# Patient Record
Sex: Female | Born: 1950 | Race: Black or African American | Hispanic: No | Smoking: Current every day smoker
Health system: Southern US, Community
[De-identification: ages and names within clinical notes are randomized; demographics above are authoritative.]

## PROBLEM LIST (undated history)

## (undated) DIAGNOSIS — F17200 Nicotine dependence, unspecified, uncomplicated: Secondary | ICD-10-CM

## (undated) DIAGNOSIS — E669 Obesity, unspecified: Secondary | ICD-10-CM

## (undated) DIAGNOSIS — M1712 Unilateral primary osteoarthritis, left knee: Secondary | ICD-10-CM

## (undated) DIAGNOSIS — R600 Localized edema: Secondary | ICD-10-CM

## (undated) DIAGNOSIS — IMO0001 Reserved for inherently not codable concepts without codable children: Secondary | ICD-10-CM

## (undated) DIAGNOSIS — M25569 Pain in unspecified knee: Secondary | ICD-10-CM

## (undated) DIAGNOSIS — I8390 Asymptomatic varicose veins of unspecified lower extremity: Secondary | ICD-10-CM

## (undated) DIAGNOSIS — Z87440 Personal history of urinary (tract) infections: Secondary | ICD-10-CM

## (undated) DIAGNOSIS — I82409 Acute embolism and thrombosis of unspecified deep veins of unspecified lower extremity: Secondary | ICD-10-CM

## (undated) DIAGNOSIS — I10 Essential (primary) hypertension: Secondary | ICD-10-CM

## (undated) HISTORY — DX: Pain in unspecified knee: M25.569

## (undated) HISTORY — DX: Personal history of urinary (tract) infections: Z87.440

## (undated) HISTORY — DX: Unilateral primary osteoarthritis, left knee: M17.12

## (undated) HISTORY — DX: Essential (primary) hypertension: I10

## (undated) HISTORY — DX: Nicotine dependence, unspecified, uncomplicated: F17.200

## (undated) HISTORY — PX: KNEE ARTHROSCOPY: SUR90

## (undated) HISTORY — DX: Obesity, unspecified: E66.9

## (undated) HISTORY — PX: TUBAL LIGATION: SHX77

## (undated) HISTORY — DX: Localized edema: R60.0

## (undated) HISTORY — DX: Reserved for inherently not codable concepts without codable children: IMO0001

---

## 1997-09-10 ENCOUNTER — Emergency Department (HOSPITAL_COMMUNITY): Admission: EM | Admit: 1997-09-10 | Discharge: 1997-09-10 | Payer: Self-pay | Admitting: Emergency Medicine

## 1998-01-25 ENCOUNTER — Encounter: Payer: Self-pay | Admitting: *Deleted

## 1998-01-25 ENCOUNTER — Emergency Department (HOSPITAL_COMMUNITY): Admission: EM | Admit: 1998-01-25 | Discharge: 1998-01-25 | Payer: Self-pay | Admitting: Emergency Medicine

## 1998-03-03 ENCOUNTER — Emergency Department (HOSPITAL_COMMUNITY): Admission: EM | Admit: 1998-03-03 | Discharge: 1998-03-03 | Payer: Self-pay | Admitting: Emergency Medicine

## 1999-03-05 ENCOUNTER — Emergency Department (HOSPITAL_COMMUNITY): Admission: EM | Admit: 1999-03-05 | Discharge: 1999-03-05 | Payer: Self-pay | Admitting: Emergency Medicine

## 1999-03-05 ENCOUNTER — Encounter: Payer: Self-pay | Admitting: Emergency Medicine

## 1999-09-28 ENCOUNTER — Encounter: Payer: Self-pay | Admitting: Emergency Medicine

## 1999-09-28 ENCOUNTER — Emergency Department (HOSPITAL_COMMUNITY): Admission: EM | Admit: 1999-09-28 | Discharge: 1999-09-28 | Payer: Self-pay | Admitting: *Deleted

## 1999-10-24 ENCOUNTER — Encounter: Payer: Self-pay | Admitting: Emergency Medicine

## 1999-10-24 ENCOUNTER — Emergency Department (HOSPITAL_COMMUNITY): Admission: EM | Admit: 1999-10-24 | Discharge: 1999-10-24 | Payer: Self-pay

## 1999-12-08 ENCOUNTER — Emergency Department (HOSPITAL_COMMUNITY): Admission: EM | Admit: 1999-12-08 | Discharge: 1999-12-08 | Payer: Self-pay | Admitting: Emergency Medicine

## 2000-01-18 DIAGNOSIS — M1712 Unilateral primary osteoarthritis, left knee: Secondary | ICD-10-CM

## 2000-01-18 HISTORY — DX: Unilateral primary osteoarthritis, left knee: M17.12

## 2000-04-01 ENCOUNTER — Emergency Department (HOSPITAL_COMMUNITY): Admission: EM | Admit: 2000-04-01 | Discharge: 2000-04-01 | Payer: Self-pay | Admitting: *Deleted

## 2000-04-01 ENCOUNTER — Emergency Department (HOSPITAL_COMMUNITY): Admission: EM | Admit: 2000-04-01 | Discharge: 2000-04-01 | Payer: Self-pay | Admitting: Emergency Medicine

## 2000-06-29 ENCOUNTER — Encounter: Admission: RE | Admit: 2000-06-29 | Discharge: 2000-06-29 | Payer: Self-pay | Admitting: Internal Medicine

## 2000-07-01 ENCOUNTER — Ambulatory Visit (HOSPITAL_COMMUNITY): Admission: RE | Admit: 2000-07-01 | Discharge: 2000-07-01 | Payer: Self-pay | Admitting: Internal Medicine

## 2000-07-06 ENCOUNTER — Encounter: Payer: Self-pay | Admitting: Family Medicine

## 2000-07-06 ENCOUNTER — Ambulatory Visit (HOSPITAL_COMMUNITY): Admission: RE | Admit: 2000-07-06 | Discharge: 2000-07-06 | Payer: Self-pay

## 2000-07-11 ENCOUNTER — Encounter: Admission: RE | Admit: 2000-07-11 | Discharge: 2000-07-11 | Payer: Self-pay | Admitting: Family Medicine

## 2000-07-11 ENCOUNTER — Encounter: Payer: Self-pay | Admitting: Family Medicine

## 2000-09-01 ENCOUNTER — Encounter: Admission: RE | Admit: 2000-09-01 | Discharge: 2000-09-01 | Payer: Self-pay | Admitting: Internal Medicine

## 2001-01-23 ENCOUNTER — Emergency Department (HOSPITAL_COMMUNITY): Admission: EM | Admit: 2001-01-23 | Discharge: 2001-01-23 | Payer: Self-pay | Admitting: Emergency Medicine

## 2001-01-23 ENCOUNTER — Encounter: Payer: Self-pay | Admitting: Emergency Medicine

## 2001-06-10 ENCOUNTER — Emergency Department (HOSPITAL_COMMUNITY): Admission: EM | Admit: 2001-06-10 | Discharge: 2001-06-10 | Payer: Self-pay | Admitting: Emergency Medicine

## 2001-06-12 ENCOUNTER — Emergency Department (HOSPITAL_COMMUNITY): Admission: EM | Admit: 2001-06-12 | Discharge: 2001-06-12 | Payer: Self-pay | Admitting: Emergency Medicine

## 2001-08-14 ENCOUNTER — Encounter: Payer: Self-pay | Admitting: Emergency Medicine

## 2001-08-14 ENCOUNTER — Emergency Department (HOSPITAL_COMMUNITY): Admission: EM | Admit: 2001-08-14 | Discharge: 2001-08-14 | Payer: Self-pay | Admitting: Emergency Medicine

## 2001-10-30 ENCOUNTER — Emergency Department (HOSPITAL_COMMUNITY): Admission: EM | Admit: 2001-10-30 | Discharge: 2001-10-30 | Payer: Self-pay | Admitting: Emergency Medicine

## 2001-10-30 ENCOUNTER — Encounter: Payer: Self-pay | Admitting: Emergency Medicine

## 2002-02-05 ENCOUNTER — Inpatient Hospital Stay (HOSPITAL_COMMUNITY): Admission: AD | Admit: 2002-02-05 | Discharge: 2002-02-05 | Payer: Self-pay | Admitting: *Deleted

## 2002-07-04 ENCOUNTER — Encounter: Admission: RE | Admit: 2002-07-04 | Discharge: 2002-07-04 | Payer: Self-pay | Admitting: Internal Medicine

## 2002-07-11 ENCOUNTER — Encounter: Admission: RE | Admit: 2002-07-11 | Discharge: 2002-07-11 | Payer: Self-pay | Admitting: Internal Medicine

## 2002-11-27 ENCOUNTER — Encounter: Admission: RE | Admit: 2002-11-27 | Discharge: 2002-11-27 | Payer: Self-pay | Admitting: Internal Medicine

## 2002-12-30 ENCOUNTER — Encounter: Admission: RE | Admit: 2002-12-30 | Discharge: 2002-12-30 | Payer: Self-pay | Admitting: Internal Medicine

## 2003-01-08 ENCOUNTER — Encounter: Admission: RE | Admit: 2003-01-08 | Discharge: 2003-01-08 | Payer: Self-pay | Admitting: Internal Medicine

## 2003-01-13 ENCOUNTER — Encounter: Admission: RE | Admit: 2003-01-13 | Discharge: 2003-01-13 | Payer: Self-pay | Admitting: Internal Medicine

## 2003-01-13 ENCOUNTER — Ambulatory Visit (HOSPITAL_COMMUNITY): Admission: RE | Admit: 2003-01-13 | Discharge: 2003-01-13 | Payer: Self-pay | Admitting: Internal Medicine

## 2003-01-28 ENCOUNTER — Encounter: Admission: RE | Admit: 2003-01-28 | Discharge: 2003-01-28 | Payer: Self-pay | Admitting: Internal Medicine

## 2003-02-27 ENCOUNTER — Encounter: Admission: RE | Admit: 2003-02-27 | Discharge: 2003-02-27 | Payer: Self-pay | Admitting: Internal Medicine

## 2003-04-22 ENCOUNTER — Ambulatory Visit (HOSPITAL_BASED_OUTPATIENT_CLINIC_OR_DEPARTMENT_OTHER): Admission: RE | Admit: 2003-04-22 | Discharge: 2003-04-22 | Payer: Self-pay | Admitting: Pediatrics

## 2004-02-04 ENCOUNTER — Inpatient Hospital Stay (HOSPITAL_COMMUNITY): Admission: AD | Admit: 2004-02-04 | Discharge: 2004-02-04 | Payer: Self-pay | Admitting: *Deleted

## 2004-07-08 ENCOUNTER — Emergency Department (HOSPITAL_COMMUNITY): Admission: EM | Admit: 2004-07-08 | Discharge: 2004-07-08 | Payer: Self-pay | Admitting: Emergency Medicine

## 2004-07-21 ENCOUNTER — Ambulatory Visit: Payer: Self-pay | Admitting: Internal Medicine

## 2004-09-07 ENCOUNTER — Ambulatory Visit: Payer: Self-pay | Admitting: Internal Medicine

## 2004-09-07 ENCOUNTER — Ambulatory Visit (HOSPITAL_COMMUNITY): Admission: RE | Admit: 2004-09-07 | Discharge: 2004-09-07 | Payer: Self-pay | Admitting: Internal Medicine

## 2005-02-05 ENCOUNTER — Emergency Department (HOSPITAL_COMMUNITY): Admission: EM | Admit: 2005-02-05 | Discharge: 2005-02-05 | Payer: Self-pay | Admitting: *Deleted

## 2005-02-24 ENCOUNTER — Ambulatory Visit: Payer: Self-pay | Admitting: Internal Medicine

## 2005-05-21 ENCOUNTER — Emergency Department (HOSPITAL_COMMUNITY): Admission: EM | Admit: 2005-05-21 | Discharge: 2005-05-22 | Payer: Self-pay | Admitting: Emergency Medicine

## 2005-05-24 ENCOUNTER — Emergency Department (HOSPITAL_COMMUNITY): Admission: EM | Admit: 2005-05-24 | Discharge: 2005-05-24 | Payer: Self-pay | Admitting: Emergency Medicine

## 2005-09-12 ENCOUNTER — Ambulatory Visit: Payer: Self-pay | Admitting: Hospitalist

## 2005-12-01 DIAGNOSIS — I10 Essential (primary) hypertension: Secondary | ICD-10-CM | POA: Insufficient documentation

## 2005-12-01 DIAGNOSIS — F17200 Nicotine dependence, unspecified, uncomplicated: Secondary | ICD-10-CM | POA: Insufficient documentation

## 2005-12-01 DIAGNOSIS — E669 Obesity, unspecified: Secondary | ICD-10-CM

## 2005-12-28 DIAGNOSIS — M1712 Unilateral primary osteoarthritis, left knee: Secondary | ICD-10-CM

## 2006-03-02 ENCOUNTER — Telehealth (INDEPENDENT_AMBULATORY_CARE_PROVIDER_SITE_OTHER): Payer: Self-pay | Admitting: *Deleted

## 2006-03-03 ENCOUNTER — Encounter (INDEPENDENT_AMBULATORY_CARE_PROVIDER_SITE_OTHER): Payer: Self-pay | Admitting: Ophthalmology

## 2006-03-03 ENCOUNTER — Ambulatory Visit: Payer: Self-pay | Admitting: Hospitalist

## 2006-03-03 LAB — CONVERTED CEMR LAB
BUN: 12 mg/dL (ref 6–23)
CO2: 27 meq/L (ref 19–32)
Calcium: 9.5 mg/dL (ref 8.4–10.5)
Chloride: 103 meq/L (ref 96–112)
Cholesterol: 123 mg/dL (ref 0–200)
Creatinine, Ser: 0.56 mg/dL (ref 0.40–1.20)
Glucose, Bld: 87 mg/dL (ref 70–99)
HDL: 37 mg/dL — ABNORMAL LOW (ref >39)
LDL Cholesterol: 61 mg/dL (ref 0–99)
Potassium: 4.8 meq/L (ref 3.5–5.3)
Sodium: 140 meq/L (ref 135–145)
Total CHOL/HDL Ratio: 3.3
Triglycerides: 125 mg/dL (ref <150)
VLDL: 25 mg/dL (ref 0–40)

## 2006-06-16 ENCOUNTER — Ambulatory Visit: Payer: Self-pay | Admitting: Internal Medicine

## 2006-06-16 LAB — CONVERTED CEMR LAB
Bilirubin Urine: NEGATIVE
Blood in Urine, dipstick: NEGATIVE
Glucose, Urine, Semiquant: NEGATIVE
Ketones, urine, test strip: NEGATIVE
Nitrite: NEGATIVE
Protein, U semiquant: NEGATIVE
Specific Gravity, Urine: 1.015
Urobilinogen, UA: 1
WBC Urine, dipstick: NEGATIVE
pH: 6.5

## 2006-07-10 ENCOUNTER — Telehealth: Payer: Self-pay | Admitting: *Deleted

## 2007-06-04 ENCOUNTER — Ambulatory Visit (HOSPITAL_COMMUNITY): Admission: RE | Admit: 2007-06-04 | Discharge: 2007-06-04 | Payer: Self-pay | Admitting: Orthopedic Surgery

## 2007-06-07 ENCOUNTER — Ambulatory Visit: Payer: Self-pay | Admitting: Internal Medicine

## 2007-06-07 ENCOUNTER — Encounter (INDEPENDENT_AMBULATORY_CARE_PROVIDER_SITE_OTHER): Payer: Self-pay | Admitting: *Deleted

## 2007-06-07 ENCOUNTER — Encounter (INDEPENDENT_AMBULATORY_CARE_PROVIDER_SITE_OTHER): Payer: Self-pay | Admitting: Internal Medicine

## 2007-06-07 ENCOUNTER — Ambulatory Visit (HOSPITAL_COMMUNITY): Admission: RE | Admit: 2007-06-07 | Discharge: 2007-06-07 | Payer: Self-pay | Admitting: Internal Medicine

## 2007-06-08 LAB — CONVERTED CEMR LAB
ALT: 24 units/L (ref 0–35)
AST: 24 units/L (ref 0–37)
Albumin: 4.6 g/dL (ref 3.5–5.2)
Alkaline Phosphatase: 54 units/L (ref 39–117)
BUN: 15 mg/dL (ref 6–23)
CO2: 22 meq/L (ref 19–32)
Calcium: 9.8 mg/dL (ref 8.4–10.5)
Chloride: 100 meq/L (ref 96–112)
Creatinine, Ser: 0.64 mg/dL (ref 0.40–1.20)
Free T4: 1.39 ng/dL (ref 0.89–1.80)
Glucose, Bld: 102 mg/dL — ABNORMAL HIGH (ref 70–99)
HCT: 42.6 % (ref 36.0–46.0)
Hemoglobin: 13.1 g/dL (ref 12.0–15.0)
MCHC: 30.8 g/dL (ref 30.0–36.0)
MCV: 89.9 fL (ref 78.0–100.0)
Platelets: 219 10*3/uL (ref 150–400)
Potassium: 4.7 meq/L (ref 3.5–5.3)
RBC: 4.74 M/uL (ref 3.87–5.11)
RDW: 14.4 % (ref 11.5–15.5)
Sodium: 139 meq/L (ref 135–145)
TSH: 2.075 microintl units/mL (ref 0.350–5.50)
Total Bilirubin: 0.6 mg/dL (ref 0.3–1.2)
Total Protein: 7.6 g/dL (ref 6.0–8.3)
WBC: 5.3 10*3/uL (ref 4.0–10.5)

## 2008-04-21 ENCOUNTER — Emergency Department (HOSPITAL_COMMUNITY): Admission: EM | Admit: 2008-04-21 | Discharge: 2008-04-21 | Payer: Self-pay | Admitting: Emergency Medicine

## 2008-09-03 ENCOUNTER — Telehealth (INDEPENDENT_AMBULATORY_CARE_PROVIDER_SITE_OTHER): Payer: Self-pay | Admitting: Internal Medicine

## 2008-09-08 ENCOUNTER — Emergency Department (HOSPITAL_COMMUNITY): Admission: EM | Admit: 2008-09-08 | Discharge: 2008-09-08 | Payer: Self-pay | Admitting: Emergency Medicine

## 2008-11-27 ENCOUNTER — Ambulatory Visit (HOSPITAL_COMMUNITY): Admission: RE | Admit: 2008-11-27 | Discharge: 2008-11-27 | Payer: Self-pay | Admitting: Orthopedic Surgery

## 2009-07-02 ENCOUNTER — Telehealth (INDEPENDENT_AMBULATORY_CARE_PROVIDER_SITE_OTHER): Payer: Self-pay | Admitting: Internal Medicine

## 2009-07-23 ENCOUNTER — Ambulatory Visit: Payer: Self-pay | Admitting: Internal Medicine

## 2009-07-24 ENCOUNTER — Telehealth: Payer: Self-pay | Admitting: *Deleted

## 2009-07-28 ENCOUNTER — Encounter: Payer: Self-pay | Admitting: Internal Medicine

## 2009-07-28 ENCOUNTER — Ambulatory Visit: Payer: Self-pay | Admitting: Internal Medicine

## 2009-07-29 LAB — CONVERTED CEMR LAB
ALT: 9 units/L (ref 0–35)
AST: 13 units/L (ref 0–37)
Albumin: 4.3 g/dL (ref 3.5–5.2)
Alkaline Phosphatase: 47 units/L (ref 39–117)
BUN: 14 mg/dL (ref 6–23)
Basophils Absolute: 0 10*3/uL (ref 0.0–0.1)
Basophils Relative: 1 % (ref 0–1)
CO2: 29 meq/L (ref 19–32)
Calcium: 9.1 mg/dL (ref 8.4–10.5)
Chloride: 98 meq/L (ref 96–112)
Cholesterol: 164 mg/dL (ref 0–200)
Creatinine, Ser: 0.76 mg/dL (ref 0.40–1.20)
Eosinophils Absolute: 0.2 10*3/uL (ref 0.0–0.7)
Eosinophils Relative: 5 % (ref 0–5)
Glucose, Bld: 122 mg/dL — ABNORMAL HIGH (ref 70–99)
HCT: 40.1 % (ref 36.0–46.0)
HDL: 34 mg/dL — ABNORMAL LOW (ref >39)
Hemoglobin: 12.4 g/dL (ref 12.0–15.0)
Hgb A1c MFr Bld: 6.5 %
LDL Cholesterol: 93 mg/dL (ref 0–99)
Lymphocytes Relative: 43 % (ref 12–46)
Lymphs Abs: 2.1 10*3/uL (ref 0.7–4.0)
MCHC: 30.9 g/dL (ref 30.0–36.0)
MCV: 89.3 fL (ref >=78.0)
Monocytes Absolute: 0.3 10*3/uL (ref 0.1–1.0)
Monocytes Relative: 7 % (ref 3–12)
Neutro Abs: 2.2 10*3/uL (ref 1.7–7.7)
Neutrophils Relative %: 45 % (ref 43–77)
Platelets: 217 10*3/uL (ref 150–400)
Potassium: 4.1 meq/L (ref 3.5–5.3)
RBC: 4.49 M/uL (ref 3.87–5.11)
RDW: 13.9 % (ref 11.5–15.5)
Sodium: 138 meq/L (ref 135–145)
Total Bilirubin: 0.5 mg/dL (ref 0.3–1.2)
Total CHOL/HDL Ratio: 4.8
Total Protein: 7.1 g/dL (ref 6.0–8.3)
Triglycerides: 187 mg/dL — ABNORMAL HIGH (ref <150)
VLDL: 37 mg/dL (ref 0–40)
WBC: 4.9 10*3/uL (ref 4.0–10.5)

## 2009-08-04 ENCOUNTER — Ambulatory Visit: Payer: Self-pay | Admitting: Internal Medicine

## 2009-08-06 LAB — HM MAMMOGRAPHY

## 2009-08-07 ENCOUNTER — Encounter: Payer: Self-pay | Admitting: Internal Medicine

## 2009-08-17 ENCOUNTER — Ambulatory Visit: Payer: Self-pay | Admitting: Internal Medicine

## 2009-08-17 DIAGNOSIS — R7309 Other abnormal glucose: Secondary | ICD-10-CM

## 2009-08-17 LAB — CONVERTED CEMR LAB
Blood Glucose, Fingerstick: 88
Hgb A1c MFr Bld: 6.4 %

## 2010-01-18 ENCOUNTER — Emergency Department (HOSPITAL_COMMUNITY)
Admission: EM | Admit: 2010-01-18 | Discharge: 2010-01-18 | Payer: Self-pay | Source: Home / Self Care | Admitting: Emergency Medicine

## 2010-02-07 ENCOUNTER — Encounter: Payer: Self-pay | Admitting: Obstetrics

## 2010-02-16 NOTE — Assessment & Plan Note (Signed)
Summary: EST-2 WEEK RECHECK/CH   Vital Signs:  Patient profile:   60 year old female Height:      62 inches (157.48 cm) Weight:      220.2 pounds (100.09 kg) BMI:     40.42 Temp:     98.3 degrees F oral Pulse rate:   74 / minute BP sitting:   140 / 79  (left arm) Cuff size:   large  Vitals Entered By: Cynda Familia Duncan Dull) (August 17, 2009 1:35 PM) CC: 2wk f/u- ?newly diagnosed dm, med refill with supply Is Patient Diabetic? Yes Did you bring your meter with you today? No Pain Assessment Patient in pain? no      Nutritional Status BMI of > 30 = obese CBG Result 88  Have you ever been in a relationship where you felt threatened, hurt or afraid?No   Does patient need assistance? Functional Status Self care Ambulation Normal   Primary Care Provider:  Lars Mage MD  CC:  2wk f/u- ?newly diagnosed dm and med refill with supply.  History of Present Illness: April Mathis is here today for a follow up appointment today for her HBa1c and new onset prediabetes.  She wants me to repeat her HBa1c today.  She is very concerned about her being diagnosed with diabetes and is not raeady to accpt the diagnosis. I told her that she is at borderline and may go either way depending upon how she eats and manges her weight.  No other complaints.  Preventive Screening-Counseling & Management  Alcohol-Tobacco     Smoking Status: current     Smoking Cessation Counseling: yes     Packs/Day: 2-3 A WEEK     Year Started: AGE 34   Mammogram  Procedure date:  08/06/2009  Findings:      Assessment: BIRADS 0.   Comments:      Screening mammogram in 1 year.     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 Take 1/2  Tablet By Mouth Once A Day  Current Medications (verified): 1)  Hydrochlorothiazide 25 Mg  Tabs (Hydrochlorothiazide) .... Take 1/2  Tablet By Mouth Once A Day  Allergies: 1)  ! Pcn  Past History:  Past Medical History: Last updated: 12/01/2005 Hypertension Bilat lower extremity edema Obesity Tobacco abuse Knee pain Degenerative joint changes, L kneee by MRI 2002 History of recurrent UTI  Risk Factors: Smoking Status: current (08/17/2009) Packs/Day: 2-3 A WEEK (08/17/2009)  Review of Systems      See HPI  Physical Exam  Additional Exam:  Gen: AOx3, in no acute distress Eyes: PERRL, EOMI ENT:MMM, No erythema noted in posterior pharynx Neck: No JVD, No LAP Chest: CTAB with  good respiratory effort CVS: regular rhythmic rate, NO M/R/G, S1 S2 normal Abdo: soft,ND, BS+x4, Non tender and No hepatosplenomegaly EXT: No odema noted Neuro: Non focal, gait is normal Skin: no rashes noted.    Impression & Recommendations:  Problem # 1:  PREDIABETES (ICD-790.29) Assessment Improved Patient's AHba1c is 6.4 today which is right  at borderline of converting into a diabetes. patient is reaslly scared of diabetic drugs and wants to manage this condition with diet management and weight loss. She would be perfect candidate for metformin therapy based on her age and risk factors for prevention of diabetes. Continue to follow and see her back in 3 months. Labs Reviewed: Creat: 0.76 (07/28/2009)     Problem # 2:  TOBACCO ABUSE (ICD-305.1) Assessment: Unchanged Conselled  about soking cessation. he smokes only 3-5 cigs at night and does not feel that she will benefit from counselling. Encouraged smoking cessation and discussed different methods for smoking cessation.   Problem #  3:  OBESITY (ICD-278.00) Assessment: Comment Only She has lost 3 pounds since last seen I encouraged her to keep losing weight and gave her information about different dietary low carb options. Ht: 62 (08/17/2009)   Wt: 220.2 (08/17/2009)   BMI: 40.42 (08/17/2009)  Complete Medication List: 1)  Hydrochlorothiazide 25 Mg Tabs (Hydrochlorothiazide) .... Take 1/2  tablet by mouth once a day  Other Orders: T-Hgb A1C (in-house) ([id number]) T- Capillary Blood Glucose ([id number])  Patient Instructions: 1)  Please schedule a follow-up appointment in 6 months. 2)  Limit your Sodium (Salt). 3)  It is important that you exercise regularly at least 20 minutes 5 times a week. If you develop chest pain, have severe difficulty breathing, or feel very tired , stop exercising immediately and seek medical attention. 4)  You need to lose weight. Consider a lower calorie diet and regular exercise.  Prescriptions: HYDROCHLOROTHIAZIDE 25 MG  TABS (HYDROCHLOROTHIAZIDE) Take 1/2  tablet by mouth once a day  #90 x 4   Entered and Authorized by:   Lars Mage MD   Signed by:   Lars Mage MD on 08/17/2009   Method used:   Electronically to        [facility] [address] [phone]* (retail)       [address] [place], [place]  [address]       Ph: [phone]       Fax: [phone]   RxID:   [id number]    Prevention & Chronic Care Immunizations   Influenza vaccine: Not documented   Influenza vaccine deferral: Deferred  (08/17/2009)    Tetanus booster: 07/23/2009: Tdap    Pneumococcal vaccine: Not documented  Colorectal Screening   Hemoccult: Not documented   Hemoccult action/deferral: Refused  (08/17/2009)    Colonoscopy: Not documented   Colonoscopy action/deferral: Refused  (08/17/2009)  Other Screening   Pap smear: Not documented   Pap smear action/deferral: Deferred  (08/17/2009)    Mammogram: Assessment: BIRADS 0.   (08/06/2009)   Mammogram action/deferral: Screening mammogram in  1 year.     (08/06/2009)   Smoking status: current  (08/17/2009)   Smoking cessation counseling: yes  (08/17/2009)  Lipids   Total Cholesterol: 164  (07/28/2009)   Lipid panel action/deferral: Deferred   LDL: 93  (07/28/2009)   LDL Direct: Not documented   HDL: 34  (07/28/2009)   Triglycerides: 187  (07/28/2009)  Hypertension   Last Blood Pressure: 140 / 79  (08/17/2009)   Serum creatinine: 0.76  (07/28/2009)   BMP action: Deferred   Serum potassium 4.1  (07/28/2009)    Hypertension flowsheet reviewed?: Yes   Progress toward BP goal: At goal  Self-Management Support :   Personal Goals (by the next clinic visit) :      Personal blood pressure goal: 140/90  (07/23/2009)  Patient will work on the following items until the next clinic visit to reach self-care goals:     Medications and monitoring: take my medicines every day  (08/17/2009)     Eating: eat foods that are low in salt, eat baked foods instead of fried foods  (08/17/2009)     Activity: take a 30 minute walk every day  (08/17/2009)    Hypertension self-management support: Pre-printed educational material, Resources for patients handout, Written self-care plan  (08/17/2009)   Hypertension self-care plan printed.      Resource handout printed.   Laboratory Results   Blood Tests   Date/Time Received: August 17, 2009 1:59 PM  Date/Time Reported: Alric Quan  August 17, 2009 1:59 PM   HGBA1C: 6.4%   (Normal Range: Non-Diabetic - 3-6%   Control Diabetic - 6-8%) CBG Random:: 88mg /dL

## 2010-02-16 NOTE — Progress Notes (Signed)
Summary: GI/ Colonoscopy appointment  Phone Note Outgoing Call   Call placed by: Angelina Ok RN,  July 24, 2009 8:46 AM Call placed to: Specialist Summary of Call: Call to [facility] to schedule screening Colonoscopy.  pt is scheduled for the Nurse visit on 07/29/2009 at 4:00 PM and scheduled for the Colonoscopy on 08/10/2009 3:00 PM to arrive at 2:00 PM at [facility]/Dr. Juanda Chance. Pt was given the appointment card.  Pt to present Medicaid and Medicare cards at  registration to be scanned into the chart.  Pt said she did not show the Insurance Cards at registration.  Angelina Ok RN  July 24, 2009 8:49 AM  Initial call taken by: Angelina Ok RN,  July 24, 2009 8:49 AM  Follow-up for Phone Call        Thank you. Follow-up by: Lars Mage MD,  July 24, 2009 9:57 AM

## 2010-02-16 NOTE — Progress Notes (Signed)
Summary: med refill  Phone Note Refill Request Message from:  Patient on July 02, 2009 2:58 PM  Refills Requested: Medication #1:  HYDROCHLOROTHIAZIDE 25 MG  TABS Take 1/2  tablet by mouth once a day.  Method Requested: Electronic Initial call taken by: Chinita Pester RN,  July 02, 2009 2:58 PM  Follow-up for Phone Call        Rx denied because patient has not been seen since 2009. Follow-up by: Nilda Riggs MD,  July 02, 2009 3:12 PM  Additional Follow-up for Phone Call Additional follow up Details #1::        She has an appt. July 23, 2009 w/Dr. Eben Burow. Additional Follow-up by: Chinita Pester RN,  July 02, 2009 3:20 PM    Additional Follow-up for Phone Call Additional follow up Details #2::    Will provide one month supply prior to being seen by Dr. Eben Burow. Follow-up by: Nilda Riggs MD,  July 02, 2009 3:27 PM  Additional Follow-up for Phone Call Additional follow up Details #3:: Details for Additional Follow-up Action Taken: Pt was called and made awared; informed to keep her appt. in July. Additional Follow-up by: Chinita Pester RN,  July 02, 2009 3:30 PM  Prescriptions: HYDROCHLOROTHIAZIDE 25 MG  TABS (HYDROCHLOROTHIAZIDE) Take 1/2  tablet by mouth once a day  #30 x 0   Entered and Authorized by:   Nilda Riggs MD   Signed by:   Nilda Riggs MD on 07/02/2009   Method used:   Electronically to        [facility] [address] [phone]* (retail)       [address] [place], [place]  [address]       Ph: [phone]       Fax: [phone]   RxID:   [id number]

## 2010-02-16 NOTE — Assessment & Plan Note (Signed)
Summary: EST-CK/FU/MEDS/CFB   Vital Signs:  Patient profile:   60 year old female Height:      62 inches (157.48 cm) Weight:      221.2 pounds (100.55 kg) BMI:     40.60 Temp:     97.7 degrees F (36.50 degrees C) Pulse rate:   74 / minute BP sitting:   120 / 76  (left arm) Cuff size:   large  Vitals Entered By: Dorie Rank RN (August 04, 2009 9:13 AM) CC: pt wanting to know what will be done about her blood sugar - has not gotten second opinion until she talks to dr today Nutritional Status BMI of > 30 = obese  Have you ever been in a relationship where you felt threatened, hurt or afraid?Unable to ask  Domestic Violence Intervention pt in hurry to see dr  Does patient need assistance? Functional Status Self care Ambulation Normal   Primary Care Provider:  Lars Mage MD  CC:  pt wanting to know what will be done about her blood sugar - has not gotten second opinion until she talks to dr today.  History of Present Illness: April Mathis is here today for follow up of last week when when she was tested for Diabetes and her HBa1c was 6.5 which is right at borderline and she was called in for the results for being a diabetic. She still refuses to accept the diagnosis and asks me to repeat the test today. She adds that she was told that she is diabetic when she was pregnant and then on repeat check was told otherwise.  Her BP is well controlled.  She wants to loose weight and asked me about the ways to do the same.   He denies any new sicknesses or hospitalizations, no chest pain episodes, no fevers, no chills, no abdominal or urinary concerns. No recent changes in appetite, weight.   Preventive Screening-Counseling & Management  Alcohol-Tobacco     Smoking Status: current     Smoking Cessation Counseling: yes     Packs/Day: 2-3 A WEEK     Year Started: AGE 62   Problems Prior to Update: 1)  Palpitations  (ICD-785.1) 2)  Preventive Health Care  (ICD-V70.0) 3)  Degenerative  Joint Disease, Knee  (ICD-715.96) 4)  Urinary Tract Infection, Recurrent  (ICD-599.0) 5)  Knee Pain, Left  (ICD-719.46) 6)  Tobacco Abuse  (ICD-305.1) 7)  Obesity  (ICD-278.00) 8)  Dependent Edema, Legs, Bilateral  (ICD-782.3) 9)  Hypertension  (ICD-401.9)  Medications Prior to Update: 1)  Hydrochlorothiazide 25 Mg  Tabs (Hydrochlorothiazide) .... Take 1/2  Tablet By Mouth Once A Day  Current Medications (verified): 1)  Hydrochlorothiazide 25 Mg  Tabs (Hydrochlorothiazide) .... Take 1/2  Tablet By Mouth Once A Day  Allergies (verified): 1)  ! Pcn  Past History:  Past Medical History: Last updated: 12/01/2005 Hypertension Bilat lower extremity edema Obesity Tobacco abuse Knee pain Degenerative joint changes, L kneee by MRI 2002 History of recurrent UTI  Risk Factors: Smoking Status: current (08/04/2009) Packs/Day: 2-3 A WEEK (08/04/2009)  Review of Systems      See HPI  Physical Exam  Additional Exam:  Gen: AOx3, in no acute distress Eyes: PERRL, EOMI ENT:MMM, No erythema noted in posterior pharynx Neck: No JVD, No LAP Chest: CTAB with  good respiratory effort CVS: regular rhythmic rate, NO M/R/G, S1 S2 normal Abdo: soft,ND, BS+x4, Non tender and No hepatosplenomegaly EXT: No odema noted Neuro: Non focal, gait is normal Skin:  no rashes noted.    Impression & Recommendations:  Problem # 1:  DIABETES MELLITUS, TYPE II (ICD-250.00) Assessment New Patient is not ready to accept the diagnosis at this time. I provided her with the information for patinets printout form uptodate and also gave her basic information about how to diagnose diabetes.  She says that she still wants to recheck her HBA1c. I told her it doesnt change much in a week eventually she agreed that she will have it checked when she looses her weight in next 2 weeks.  Orders: T-Hgb A1C (in-house) ([id number])  Problem # 2:  TOBACCO ABUSE (ICD-305.1) Assessment: Unchanged  Encouraged smoking  cessation and discussed different methods for smoking cessation.  Patient was counseled on smoking cessation strategies including medications and behavior modification options.   Problem # 3:  OBESITY (ICD-278.00) Assessment: Unchanged  Discussed various strategies to loose weight. She agrred to try and go on a juice nsd fruit diet for next 2 weeks and return for a recheck of her HBa1c.  Ht: 62 (08/04/2009)   Wt: 221.2 (08/04/2009)   BMI: 40.60 (08/04/2009)  Problem # 4:  HYPERTENSION (ICD-401.9) Assessment: Comment Only Well controlled on HCTZ. Her updated medication list for this problem includes:    Hydrochlorothiazide 25 Mg Tabs (Hydrochlorothiazide) .Marland Kitchen... Take 1/2  tablet by mouth once a day  BP today: 120/76 Prior BP: 126/75 (07/23/2009)  Labs Reviewed: K+: 4.1 (07/28/2009) Creat: : 0.76 (07/28/2009)   Chol: 164 (07/28/2009)   HDL: 34 (07/28/2009)   LDL: 93 (07/28/2009)   TG: 187 (07/28/2009)  Problem # 5:  Preventive Health Care (ICD-V70.0) Assessment: Comment Only She was in alot of hurry and we were not able to discuss about various preventive strategies.  Complete Medication List: 1)  Hydrochlorothiazide 25 Mg Tabs (Hydrochlorothiazide) .... Take 1/2  tablet by mouth once a day  Patient Instructions: 1)  Please schedule a follow-up appointment in 2 weeks. 2)  YOu need to loose weight. 3)  We have not started you any meds at this time but we will like you accept your diagnosis and read about metformin. 4)  Limit your Sodium (Salt). 5)  It is important that you exercise regularly at least 20 minutes 5 times a week. If you develop chest pain, have severe difficulty breathing, or feel very tired , stop exercising immediately and seek medical attention. 6)  You need to lose weight. Consider a lower calorie diet and regular exercise.  7)  Check your Blood Pressure regularly. If it is above: you should make an appointment.   Prevention & Chronic Care Immunizations    Influenza vaccine: Not documented   Influenza vaccine deferral: Deferred  (07/23/2009)    Tetanus booster: 07/23/2009: Tdap    Pneumococcal vaccine: Not documented  Colorectal Screening   Hemoccult: Not documented   Hemoccult action/deferral: Deferred  (07/23/2009)    Colonoscopy: Not documented   Colonoscopy action/deferral: GI referral  (07/23/2009)  Other Screening   Pap smear: Not documented   Pap smear action/deferral: GYN Referral  (07/23/2009)    Mammogram: Not documented   Mammogram action/deferral: Ordered  (07/23/2009)   Smoking status: current  (08/04/2009)   Smoking cessation counseling: yes  (08/04/2009)  Diabetes Mellitus   HgbA1C: 6.5  (07/28/2009)    Eye exam: Not documented    Foot exam: Not documented   High risk foot: Not documented   Foot care education: Not documented    Urine microalbumin/creatinine ratio: Not documented  Lipids   Total Cholesterol:  164  (07/28/2009)   Lipid panel action/deferral: Deferred   LDL: 93  (07/28/2009)   LDL Direct: Not documented   HDL: 34  (07/28/2009)   Triglycerides: 187  (07/28/2009)  Hypertension   Last Blood Pressure: 120 / 76  (08/04/2009)   Serum creatinine: 0.76  (07/28/2009)   BMP action: Deferred   Serum potassium 4.1  (07/28/2009)  Self-Management Support :   Personal Goals (by the next clinic visit) :      Personal blood pressure goal: 140/90  (07/23/2009)   Diabetes self-management support: Resources for patients handout  (07/23/2009)    Hypertension self-management support: Written self-care plan, Education handout, Pre-printed educational material, Resources for patients handout  (07/23/2009)

## 2010-02-16 NOTE — Assessment & Plan Note (Signed)
Summary: EST-CK/FU/MEDS/CFB   Vital Signs:  Patient profile:   60 year old female Height:      62 inches (157.48 cm) Weight:      223.01 pounds (101.37 kg) BMI:     40.94 Temp:     98.2 degrees F (36.78 degrees C) oral Pulse rate:   74 / minute BP sitting:   126 / 75  (right arm)  Vitals Entered By: Angelina Ok RN (July 23, 2009 3:49 PM) CC: Depression Is Patient Diabetic? No Pain Assessment Patient in pain? no      Nutritional Status BMI of > 30 = obese  Have you ever been in a relationship where you felt threatened, hurt or afraid?No   Does patient need assistance? Functional Status Self care Ambulation Normal Comments Wants to be tested for Diabetes.  Has had Gestational Diabetes.  Dry mouth.  Problems holding her water at times.   Has been taking 1 whole B/P pill and then sometimes a 1/2 pill.  Had pain in the ack of her head after using eye drops from the Eye docteor.  Got betterafter taking the Eye drops.   Primary Care Provider:  Lars Mage MD  CC:  Depression.  History of Present Illness: Patient is 60 yo women with PMH as described in EMR is here today for routine follow up.  She wants to get herself checked for diabetes as she had gestational diabetes about 25 years ago. She does have increased thirst, execessive urination or weight loss these days.   Her BP is well controlled.  No other complaints at this time.  Depression History:      The patient denies a depressed mood most of the day and a diminished interest in her usual daily activities.         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 Take 1/2  Tablet By Mouth Once A Day  Current Medications (verified): 1)  Hydrochlorothiazide 25 Mg  Tabs (Hydrochlorothiazide) .... Take 1/2  Tablet By Mouth Once A Day  Allergies (verified): 1)  ! Pcn  Past History:  Past Medical History: Last updated: 12/01/2005 Hypertension Bilat lower extremity edema Obesity Tobacco abuse Knee pain Degenerative joint changes, L kneee by MRI 2002 History of recurrent UTI  Risk Factors: Smoking Status: current (07/23/2009) Packs/Day: 2-3 A WEEK (07/23/2009)  Review of Systems      See HPI  Physical Exam  Additional Exam:  Gen: AOx3, in no acute distress Eyes: PERRL, EOMI ENT:MMM, No erythema noted in posterior pharynx Neck: No JVD, No LAP Chest: CTAB with  good respiratory effort CVS: regular rhythmic rate, NO M/R/G, S1 S2 normal Abdo: soft,ND, BS+x4, Non tender and No hepatosplenomegaly EXT: No odema noted Neuro: Non focal, gait is normal Skin: no rashes noted.    Impression & Recommendations:  Problem #  1:  OBESITY (ICD-278.00) I will check her Hba1c to rule out Diabeties as she is high risk due to gestational diabests and obesity. Discussed various measures to lose weight. Future Orders: T-CMP with Estimated GFR ([id number]) ... 07/28/2009 T-Hgb A1C (in-house) [phone]) ... 07/28/2009  Ht: 62 (07/23/2009)   Wt: 223.01 (07/23/2009)   BMI: 40.94 (07/23/2009)  Problem # 2:  TOBACCO ABUSE (ICD-305.1) Assessment: Comment Only Patient was counseled on smoking cessation strategies including medications and behavior modification options.  Encouraged smoking cessation and discussed different methods for smoking cessation. Not ready to quit at this  time.  Problem # 3:  PREVENTIVE HEALTH CARE (ICD-V70.0) Assessment: Comment Only Tetanus Mammogram Gyn referral Yearly Lipd profile for monitoring   Orders: Tdap => 4yrs IM ([id number]) Admin 1st Vaccine ([id number])Future Orders: T-Lipid Profile ([id number]) ... 07/28/2009 T-CBC w/Diff ([id number]) ... 07/28/2009 T-Hgb A1C (in-house) [phone]) ... 07/28/2009  Problem # 4:  HYPERTENSION (ICD-401.9) Assessment: Improved Well conmtrolled with current meds. Will, check Cm,et for K and cRt and she does not have a hepatic panel in system and will order Cmet instaed of Bmet. Her updated medication list for this problem includes:    Hydrochlorothiazide 25 Mg Tabs (Hydrochlorothiazide) .Marland Kitchen... Take 1/2  tablet by mouth once a day  Future Orders: T-CMP with Estimated GFR ([id number]) ... 07/28/2009  BP today: 126/75 Prior BP: 111/77 (06/07/2007)  Labs Reviewed: K+: 4.7 (06/07/2007) Creat: : 0.64 (06/07/2007)   Chol: 123 (03/03/2006)   HDL: 37 (03/03/2006)   LDL: 61 (03/03/2006)   TG: 125 (03/03/2006)  Complete Medication List: 1)  Hydrochlorothiazide 25 Mg Tabs (Hydrochlorothiazide) .... Take 1/2  tablet by mouth once a day  Other Orders: Gynecologic Referral (Gyn)  Patient Instructions: 1)  You have an appointment on July 12th 2011 and you have to come fasting and get your blood drawn for the regular labs. 2)  You also have Lifestyle modification classes on that day which starts at 11 AM. 3)  Tobacco is very bad for your health and your loved ones! You Should stop smoking!. 4)  Stop Smoking Tips: Choose a Quit date. Cut down before the Quit date. decide what you will do as a substitute when you feel the urge to smoke(gum,toothpick,exercise). 5)  It is important that you exercise regularly at least 20 minutes 5 times a week. If you develop chest pain, have severe difficulty breathing, or feel very tired , stop exercising immediately and seek medical attention. 6)  You need to lose weight.  Consider a lower calorie diet and regular exercise.  7)  Schedule your mammogram. 8)  Schedule a colonoscopy/sigmoidoscopy to help detect colon cancer. 9)  You need to have a Pap Smear to prevent cervical cancer. 10)  Check your Blood Pressure regularly. If it is above: you should make an appointment. Prescriptions: HYDROCHLOROTHIAZIDE 25 MG  TABS (HYDROCHLOROTHIAZIDE) Take 1/2  tablet by mouth once a day  #30 x 12   Entered and Authorized by:   Lars Mage MD   Signed by:   Lars Mage MD on 07/23/2009   Method used:   Print then Give to Patient   RxID:   [id number]   Prevention & Chronic Care Immunizations   Influenza vaccine: Not documented   Influenza vaccine deferral: Deferred  (07/23/2009)    Tetanus booster: 07/23/2009: Tdap    Pneumococcal vaccine: Not documented  Colorectal Screening   Hemoccult: Not documented   Hemoccult action/deferral: Deferred  (07/23/2009)    Colonoscopy: Not documented   Colonoscopy action/deferral:  GI referral  (07/23/2009)  Other Screening   Pap smear: Not documented   Pap smear action/deferral: GYN Referral  (07/23/2009)    Mammogram: Not documented   Mammogram action/deferral: Ordered  (07/23/2009)   Smoking status: current  (07/23/2009)   Smoking cessation counseling: yes  (07/23/2009)  Lipids   Total Cholesterol: 123  (03/03/2006)   Lipid panel action/deferral: Deferred   LDL: 61  (03/03/2006)   LDL Direct: Not documented   HDL: 37  (03/03/2006)   Triglycerides: 125  (03/03/2006)  Hypertension   Last Blood Pressure: 126 / 75  (07/23/2009)   Serum creatinine: 0.64  (06/07/2007)   BMP action: Deferred   Serum potassium 4.7  (06/07/2007)    Hypertension flowsheet reviewed?: Yes   Progress toward BP goal: At goal  Self-Management Support :   Personal Goals (by the next clinic visit) :      Personal blood pressure goal: 140/90  (07/23/2009)   Patient will work on the following items until the next clinic visit to reach  self-care goals:     Medications and monitoring: take my medicines every day, bring all of my medications to every visit  (07/23/2009)     Eating: drink diet soda or water instead of juice or soda, eat more vegetables, eat foods that are low in salt, eat baked foods instead of fried foods, eat fruit for snacks and desserts  (07/23/2009)     Activity: take a 30 minute walk every day  (07/23/2009)    Hypertension self-management support: Written self-care plan, Education handout, Pre-printed educational material, Resources for patients handout  (07/23/2009)   Hypertension self-care plan printed.   Hypertension education handout printed      Resource handout printed.   Nursing Instructions: Give tetanus booster today Gyn referral for screening Pap (see order) Schedule screening mammogram (see order)     Vital Signs:  Patient profile:   60 year old female Height:      62 inches (157.48 cm) Weight:      223.01 pounds (101.37 kg) BMI:     40.94 Temp:     98.2 degrees F (36.78 degrees C) oral Pulse rate:   74 / minute BP sitting:   126 / 75  (right arm)  Vitals Entered By: Angelina Ok RN (July 23, 2009 3:49 PM)   Process Orders Check Orders Results:     [facility]: ABN not required for this insurance Tests Sent for requisitioning (July 28, 2009 2:32 PM):     07/28/2009: [facility] -- T-CMP with Estimated GFR [[id number]] (signed)     07/28/2009: [facility] -- T-Lipid Profile [phone] (signed)     07/28/2009: [facility] -- [facility] w/Diff [[id number]] (signed)    Process Orders Check Orders Results:     [facility]: ABN not required for this insurance Tests Sent for requisitioning (July 28, 2009 2:32 PM):     07/28/2009: [facility] -- T-CMP with Estimated GFR [[id number]] (signed)     07/28/2009: [facility] -- T-Lipid Profile [phone] (signed)      07/28/2009: [facility] -- [facility] w/Diff [[id number]] (signed)      Immunizations Administered:  Tetanus Vaccine:    Vaccine Type: Tdap    Site: right deltoid    Mfr: GlaxoSmithKline    Dose: 0.5 ml    Route: IM    Given by: Angelina Ok RN    Exp. Date: 04/10/2011    Lot #: [id number]  VIS given: 12/05/06 version given July 23, 2009.  Appended Document: EST-CK/FU/MEDS/CFB HCTZ Rx called to [facility] on [address]; pt. stated she did not receive a Rx 7/7.

## 2010-02-26 ENCOUNTER — Encounter: Payer: Self-pay | Admitting: Internal Medicine

## 2010-02-26 ENCOUNTER — Emergency Department (HOSPITAL_COMMUNITY)
Admission: EM | Admit: 2010-02-26 | Discharge: 2010-02-26 | Disposition: A | Discharge status: Home / Self Care | Payer: Medicare Other | Attending: Emergency Medicine | Admitting: Emergency Medicine

## 2010-02-26 DIAGNOSIS — N390 Urinary tract infection, site not specified: Secondary | ICD-10-CM | POA: Insufficient documentation

## 2010-02-26 DIAGNOSIS — R300 Dysuria: Secondary | ICD-10-CM | POA: Insufficient documentation

## 2010-02-26 DIAGNOSIS — I10 Essential (primary) hypertension: Secondary | ICD-10-CM | POA: Insufficient documentation

## 2010-02-26 LAB — URINALYSIS, ROUTINE W REFLEX MICROSCOPIC
Bilirubin Urine: NEGATIVE
Ketones, ur: NEGATIVE mg/dL
Nitrite: NEGATIVE
Protein, ur: NEGATIVE mg/dL
Specific Gravity, Urine: 1.011 (ref 1.005–1.030)
Urine Glucose, Fasting: NEGATIVE mg/dL
Urobilinogen, UA: 0.2 mg/dL (ref 0.0–1.0)
pH: 7 (ref 5.0–8.0)

## 2010-02-26 LAB — URINE MICROSCOPIC-ADD ON

## 2010-03-29 LAB — URINE CULTURE
Colony Count: 55000
Culture  Setup Time: 201201030558

## 2010-03-29 LAB — URINALYSIS, ROUTINE W REFLEX MICROSCOPIC
Bilirubin Urine: NEGATIVE
Glucose, UA: NEGATIVE mg/dL
Ketones, ur: NEGATIVE mg/dL
Nitrite: NEGATIVE
Protein, ur: 30 mg/dL — AB
Specific Gravity, Urine: 1.015 (ref 1.005–1.030)
Urobilinogen, UA: 1 mg/dL (ref 0.0–1.0)
pH: 6 (ref 5.0–8.0)

## 2010-03-29 LAB — URINE MICROSCOPIC-ADD ON

## 2010-04-02 LAB — GLUCOSE, CAPILLARY: Glucose-Capillary: 88 mg/dL (ref 70–99)

## 2010-04-21 LAB — COMPREHENSIVE METABOLIC PANEL
ALT: 19 U/L (ref 0–35)
AST: 28 U/L (ref 0–37)
Albumin: 3.9 g/dL (ref 3.5–5.2)
Alkaline Phosphatase: 46 U/L (ref 39–117)
BUN: 10 mg/dL (ref 6–23)
CO2: 30 mEq/L (ref 19–32)
Calcium: 9.3 mg/dL (ref 8.4–10.5)
Chloride: 97 mEq/L (ref 96–112)
Creatinine, Ser: 0.67 mg/dL (ref 0.4–1.2)
GFR calc Af Amer: >60 mL/min (ref >60)
GFR calc non Af Amer: >60 mL/min (ref >60)
Glucose, Bld: 115 mg/dL — ABNORMAL HIGH (ref 70–99)
Potassium: 4.1 mEq/L (ref 3.5–5.1)
Sodium: 134 mEq/L — ABNORMAL LOW (ref 135–145)
Total Bilirubin: 0.9 mg/dL (ref 0.3–1.2)
Total Protein: 7.4 g/dL (ref 6.0–8.3)

## 2010-04-21 LAB — CBC
HCT: 38.6 % (ref 36.0–46.0)
Hemoglobin: 13.2 g/dL (ref 12.0–15.0)
MCHC: 34.1 g/dL (ref 30.0–36.0)
MCV: 87.3 fL (ref 78.0–100.0)
Platelets: 196 10*3/uL (ref 150–400)
RBC: 4.42 MIL/uL (ref 3.87–5.11)
RDW: 14 % (ref 11.5–15.5)
WBC: 6.6 10*3/uL (ref 4.0–10.5)

## 2010-04-21 LAB — PROTIME-INR
INR: 1.05 (ref 0.00–1.49)
Prothrombin Time: 13.6 seconds (ref 11.6–15.2)

## 2010-04-21 LAB — APTT: aPTT: 32 seconds (ref 24–37)

## 2010-04-27 ENCOUNTER — Emergency Department (HOSPITAL_COMMUNITY)
Admission: EM | Admit: 2010-04-27 | Discharge: 2010-04-27 | Disposition: A | Discharge status: Home / Self Care | Payer: Medicare Other | Attending: Emergency Medicine | Admitting: Emergency Medicine

## 2010-04-27 ENCOUNTER — Telehealth: Payer: Self-pay | Admitting: *Deleted

## 2010-04-27 ENCOUNTER — Encounter: Payer: Self-pay | Admitting: Internal Medicine

## 2010-04-27 ENCOUNTER — Ambulatory Visit (INDEPENDENT_AMBULATORY_CARE_PROVIDER_SITE_OTHER): Payer: Medicare Other | Admitting: Internal Medicine

## 2010-04-27 ENCOUNTER — Encounter: Payer: Self-pay | Admitting: *Deleted

## 2010-04-27 DIAGNOSIS — F17200 Nicotine dependence, unspecified, uncomplicated: Secondary | ICD-10-CM | POA: Insufficient documentation

## 2010-04-27 DIAGNOSIS — R112 Nausea with vomiting, unspecified: Secondary | ICD-10-CM

## 2010-04-27 DIAGNOSIS — I10 Essential (primary) hypertension: Secondary | ICD-10-CM

## 2010-04-27 DIAGNOSIS — E119 Type 2 diabetes mellitus without complications: Secondary | ICD-10-CM

## 2010-04-27 DIAGNOSIS — E669 Obesity, unspecified: Secondary | ICD-10-CM

## 2010-04-27 DIAGNOSIS — N390 Urinary tract infection, site not specified: Secondary | ICD-10-CM

## 2010-04-27 DIAGNOSIS — R109 Unspecified abdominal pain: Secondary | ICD-10-CM | POA: Insufficient documentation

## 2010-04-27 DIAGNOSIS — Z79899 Other long term (current) drug therapy: Secondary | ICD-10-CM | POA: Insufficient documentation

## 2010-04-27 LAB — POCT I-STAT, CHEM 8
BUN: 11 mg/dL (ref 6–23)
Calcium, Ion: 1.05 mmol/L — ABNORMAL LOW (ref 1.12–1.32)
Chloride: 102 mEq/L (ref 96–112)
Creatinine, Ser: 0.6 mg/dL (ref 0.4–1.2)
Glucose, Bld: 125 mg/dL — ABNORMAL HIGH (ref 70–99)
HCT: 41 % (ref 36.0–46.0)
Hemoglobin: 13.9 g/dL (ref 12.0–15.0)
Potassium: 3.9 mEq/L (ref 3.5–5.1)
Sodium: 140 mEq/L (ref 135–145)
TCO2: 27 mmol/L (ref 0–100)

## 2010-04-27 LAB — URINALYSIS, ROUTINE W REFLEX MICROSCOPIC
Protein, ur: NEGATIVE mg/dL
Urobilinogen, UA: 1 mg/dL (ref 0.0–1.0)

## 2010-04-27 LAB — DIFFERENTIAL
Basophils Absolute: 0 10*3/uL (ref 0.0–0.1)
Basophils Relative: 0 % (ref 0–1)
Eosinophils Relative: 0 % (ref 0–5)
Monocytes Absolute: 0.3 10*3/uL (ref 0.1–1.0)
Neutro Abs: 6.3 10*3/uL (ref 1.7–7.7)

## 2010-04-27 LAB — POCT GLYCOSYLATED HEMOGLOBIN (HGB A1C): Hemoglobin A1C: 5.9

## 2010-04-27 LAB — CBC
Hemoglobin: 12.8 g/dL (ref 12.0–15.0)
MCHC: 32.6 g/dL (ref 30.0–36.0)
RDW: 14.1 % (ref 11.5–15.5)

## 2010-04-27 MED ORDER — CIPROFLOXACIN HCL 500 MG PO TABS: 500.0000 mg | ORAL_TABLET | Freq: Two times a day (BID) | ORAL | Status: DC

## 2010-04-27 MED ORDER — LISINOPRIL 10 MG PO TABS: 10.0000 mg | ORAL_TABLET | Freq: Every day | ORAL | Status: DC

## 2010-04-27 NOTE — Assessment & Plan Note (Signed)
Patient has been having problems with frequent urination and requests that it should be changed to something else. i will start her on lisinopril 10 mg and call her back in 7-10 days for repeat Bmet and bp check.

## 2010-04-27 NOTE — Assessment & Plan Note (Addendum)
Patient is having recurrent urinary tract infections. Despite having negative urine analysis in ER I would still prescribe her ciprofloxacin mostly for GI reasons but definitely since she is complaining to me of urinary frequency and had gone to urinate at least twice during a 15 minute conversation. Patient definitely does not have abdominal tenderness or CVA tenderness which makes kidney infection less likely.

## 2010-04-27 NOTE — Assessment & Plan Note (Signed)
Patient's symptoms are unclear at this point of time. It could definitely be a viral gastroenteritis but the possibilities of an H. pylori infection or a similar bacterial gastroenteritis cannot be completely ruled out I would start the patient on ciprofloxacin 500 mg twice a day since this will cover GU tract organisms as well. Follow up on an as needed basis.

## 2010-04-27 NOTE — Assessment & Plan Note (Signed)
Given her nausea and vomiting I would not change any blood pressure medications despite she is hypertensive.

## 2010-04-27 NOTE — Progress Notes (Signed)
  Subjective:    Patient ID: April Mathis, female    DOB: June 22, 1950, 60 y.o.   MRN: [id number]  HPI patient is a 60 year old female with past medical history as described.  Patient was seen in the ER this morning for nausea and vomiting and was discharged after her initial blood work was negative.  Patient comes in today for followup of her nausea and vomiting. Patient had a big meal yesterday evening and started throwing up about midnight patient states she had about 2-3 vomiting and vomited everything that she ate about 5-6 hours ago. The vomitus did not contain any blood. The vomitus was not projectile. Patient feels weak and is not able to eat anything. Patient feels that she has had fever though her temperature in the emergency department was 98.8. Patient's temperature today here in the clinic is also 98.8. No chest pain shortness of breath. No hypotension. Patient does not have tachycardia.  Patient does complain that she's had problem with constipation last few days. I advised her to take fiber supplements with her diet.  Patient is obese with weight of about 96 kg. I advised her to lose some weight. Patient is also prediabetic at this time.  Patient has had problems with the recurrent urinary tract infections with at least one infection every year for last 3 years. Patient does complain of increased urinary frequency and last 2 days.      Review of Systems  Constitutional: Negative for fever, activity change and appetite change.  HENT: Negative for sore throat.   Respiratory: Negative for cough and shortness of breath.   Cardiovascular: Negative for chest pain and leg swelling.  Gastrointestinal: Negative for nausea, abdominal pain, diarrhea, constipation and abdominal distention.       [Nausea and vomiting Genitourinary: Positive for frequency. Negative for dysuria, hematuria, difficulty urinating and dyspareunia.       [constipation Neurological: Negative for dizziness and  headaches.  Psychiatric/Behavioral: Negative for suicidal ideas and behavioral problems.       Objective:   Physical Exam  Constitutional: She is oriented to person, place, and time. She appears well-developed and well-nourished.  HENT:  Head: Normocephalic and atraumatic.  Eyes: Conjunctivae and EOM are normal. Pupils are equal, round, and reactive to light. No scleral icterus.  Neck: Normal range of motion. Neck supple. No JVD present. No thyromegaly present.  Cardiovascular: Normal rate, regular rhythm, normal heart sounds and intact distal pulses.  Exam reveals no gallop and no friction rub.   No murmur heard. Pulmonary/Chest: Effort normal and breath sounds normal. No respiratory distress. She has no wheezes. She has no rales.  Abdominal: Soft. Bowel sounds are normal. She exhibits no distension and no mass. There is no tenderness. There is no rebound and no guarding.  Musculoskeletal: Normal range of motion. She exhibits no edema and no tenderness.  Lymphadenopathy:    She has no cervical adenopathy.  Neurological: She is alert and oriented to person, place, and time.  Psychiatric: She has a normal mood and affect. Her behavior is normal.          Assessment & Plan:

## 2010-04-27 NOTE — Progress Notes (Unsigned)
  Subjective:    Patient ID: April Mathis, female    DOB: [birth year], 60 y.o.   MRN: [id number]  HPI    Review of Systems     Objective:   Physical Exam        Assessment & Plan:

## 2010-04-27 NOTE — Patient Instructions (Signed)
Nausea Nausea is the uncomfortable feeling you get in your stomach before you vomit. There are many causes of nausea and vomiting. Common examples include:   Virus infections.   Food poisoning.   Medications.   Pregnancy.   Motion sickness.   Migraine headaches.   Emotional distress.   Severe pain from any source.   Alcohol intoxication.  Treatment is aimed at the relief of symptoms. If able, prevention of vomiting and dehydration works best.  Most virus infections and food poisoning symptoms will improve within 12-24 hours of onset. Some medications that cause nausea may be taken at bedtime. Avoid alcohol since this can irritate the stomach and make your symptoms worse. Get plenty of rest. Eat or drink small amounts of food and liquids more often. Medications for nausea, vomiting, and motion sickness are given orally, by suppository, or by injection. Call your caregiver if your condition is not improved within 2 days or your condition gets worse in a few hours. SEEK IMMEDIATE MEDICAL CARE IF:  You develop repeated vomiting, blood in the vomit, or dark or bloody stools   You develop severe chest or abdominal pain or headache   You develop fainting, extreme weakness, dehydration, or a high fever  Document Released: 02/11/2004 Document Re-Released: 04/01/2008 [facility] Patient Information 2011 [place], [place].   Call back in clinic or come to Er if you feel worse. Take plenty of fluids. Follow up in 7-10 days. You have been started on a new medicine today.

## 2010-04-27 NOTE — Assessment & Plan Note (Signed)
Advised extensively about benefit of weight loss especially when she's prediabetic. I do not think that patient completely understands her situation and keeps on asking me about what his prediabetes despite extensive counseling in the past, I had also given her written documentation with instructions on how to interpret prediabetes.

## 2010-05-04 ENCOUNTER — Encounter: Payer: Self-pay | Admitting: Internal Medicine

## 2010-05-04 ENCOUNTER — Ambulatory Visit (INDEPENDENT_AMBULATORY_CARE_PROVIDER_SITE_OTHER): Payer: Medicare Other | Admitting: Internal Medicine

## 2010-05-04 VITALS — BP 138/78 | HR 72 | Temp 98.3°F | Resp 20 | Ht 61.0 in | Wt 213.2 lb

## 2010-05-04 DIAGNOSIS — I10 Essential (primary) hypertension: Secondary | ICD-10-CM

## 2010-05-04 DIAGNOSIS — N390 Urinary tract infection, site not specified: Secondary | ICD-10-CM

## 2010-05-04 DIAGNOSIS — E669 Obesity, unspecified: Secondary | ICD-10-CM

## 2010-05-04 DIAGNOSIS — N3943 Post-void dribbling: Secondary | ICD-10-CM

## 2010-05-04 LAB — POCT URINALYSIS DIPSTICK
Blood, UA: NEGATIVE
Protein, UA: NEGATIVE
Spec Grav, UA: 1.01
Urobilinogen, UA: 0.2

## 2010-05-04 NOTE — Assessment & Plan Note (Signed)
I will have her restart her HCTZ

## 2010-05-04 NOTE — Patient Instructions (Signed)
2000 Calorie Diet The 2000 calorie diabetic diet is designed for eating up to 2000 calories each day. Following this diet and making healthy meal choices can help improve overall health. It controls blood sugar levels. It can also lower blood pressure and cholesterol. SERVING SIZES Measuring foods and serving sizes helps to make sure you are getting the right amount of food. The list below tells how big or small some common serving sizes are.  1 ounce (oz).........................4 stacked dice.   3 oz.....................................Marland KitchenDeck of cards.   1 teaspoon (tsp)..................Marland KitchenTip of little finger.   1 tablespoon (Tbsp)...........Marland KitchenMarland KitchenThumb.   2 Tbsp.................................Marland KitchenGolf ball.   1/2 cup................................Marland KitchenHalf of a fist.   1 cup...................................Marland KitchenA fist.  GUIDELINES FOR CHOOSING FOODS The goal of this diet is to eat a variety of foods and limit calories to 2000 each day. This can be done by choosing foods that are low in calories and fat. The diet also suggests eating small amounts of food often. Doing this helps control your blood sugar levels so they do not get too high or too low. Each meal or snack should contain a protein food source to help you feel more satisfied and to stabilize your blood sugar. Try to eat about the same amount of food around the same time each day. This includes weekend days, travel days, and days off work. Space your meals about 4 to 5 hours apart and add a snack between them if you wish. For example, a daily food plan could include breakfast, a morning snack, lunch, dinner, and an evening snack. Healthy meals and snacks include whole grains, vegetables, fruits, lean meats, poultry, fish, and dairy products. As you plan your meals, choose a variety of foods. Choose from the bread and starches, vegetables, fruit, dairy, and meat/protein groups. Examples of foods from each group are listed below with their  suggested serving sizes. Use measuring cups and spoons to become familiar with what a healthy portion looks like. Bread and Starches Each serving equals 15 grams of carbohydrates 1 slice bread 1/4 bagel 3/4 cup or 1 cup cold cereal (unsweetened) 1/2 cup hot cereal or mashed potatoes 1 small potato (size of a computer mouse) 1/3 cup cooked pasta or rice 1/2 English muffin 1 cup broth-based soup 3 cups popcorn 4 to 6 whole-wheat crackers 1/2 cup cooked beans, peas, or corn Vegetables Each serving equals 5 grams of carbohydrates 1/2 cup cooked vegetables 1 cup raw vegetables 1/2 cup tomato juice Fruit Each serving equals 15 grams of carbohydrates 1 small apple, banana, or orange 1 1/4 cup watermelon or strawberries 1/2 cup applesauce (no sugar added) 2 Tbsp raisins 1/2 banana 1/2 cup unsweetened canned fruit 1/2 cup unsweetened fruit juice Dairy Each serving equals 12 to 15 grams of carbohydrates 1 cup fat-free milk 6 oz artificially sweetened yogurt 1 cup buttermilk 1 cup soy milk Meat/Protein 1 large egg 2 to 3 oz meat, poultry, or fish 1/2 cup cottage cheese 1 Tbsp peanut butter 1/2 cup tofu 1 oz cheese 1/4 cup tuna canned in water SAMPLE 2000 CALORIE DIET PLAN Breakfast  1 English muffin (2 carb choices)   Reduced fat cream cheese, 1 Tbsp   1 scrambled egg   1/2 grapefruit (1 carb choice)   Fat-free milk, 1 cup (1 carb choice)  Morning Snack  Artificially sweetened yogurt, 6 oz (1 carb choice)   2 Tbsp chopped nuts   1 small peach (1 carb choice)  Lunch  Grilled chicken sandwich:   1 hamburger bun (2  carb choices)   2 oz chicken breast   1 lettuce leaf   2 slices tomato   Reduced fat mayonnaise, 1 Tbsp   Carrot sticks, 1 cup   Celery, 1 cup   1 small apple (1 carb choice)   Fat-free milk, 1 cup (1 carb choice)  Afternoon Snack  1/2 cup low-fat cottage cheese   1 1/4 cups strawberries (1 carb choice)  Dinner  Steak fajitas:    2 oz lean steak   1 whole-wheat tortilla, 8 inches (1.5 carb choices)   Shredded lettuce, 1/4 cup   2 slices tomato   Salsa, 1/4 cup   Low-fat sour cream, 2 Tbsp   Brown rice, 1/3 cup (1 carb choice)   1 small orange (1 carb choice)  Evening Snack  4 reduced fat whole-wheat crackers (1 carb choice)   1 Tbsp peanut butter   12 to 15 grapes (1 carb choice)  MEAL PLAN Use this worksheet to help you make a daily meal plan based on the 2000 calorie diabetic diet suggestions. The total amount of carbohydrates in your meal or snack is more important than making sure you include all of the food groups at every meal or snack. If you are using this plan to help you control your blood glucose, you may interchange carbohydrate containing foods (dairy, starches, and fruits). Choose a variety of fresh foods of varying colors and flavors. You can choose from the following foods to build your day's meals:  11 Starches.   4 Vegetables.   3 Fruits.   3 Dairy.   8 oz Meat.   Up to 6 Fats.  Your dietician can use this worksheet to help you decide how many servings and what types of foods are right for you. Breakfast Food Group and Servings Food Choice Starches ____________________________________________ Dairy ______________________________________________ Fruit _______________________________________________ Meat _______________________________________________ Fat_________________________________________________ April Mathis Food Group and Servings Food Choice Starch _____________________________________________ Meat ______________________________________________ Vegetables __________________________________________ Fruit _______________________________________________ Dairy_______________________________________________ Fat_________________________________________________ Afternoon Snack Food Group and Servings Food  Choice Starch_________________________________________________ Meat__________________________________________________ Fruit__________________________________________________ April Mathis Group and Servings Food Choice Starches ____________________________________________ Meat _______________________________________________ Dairy _______________________________________________ Vegetables ___________________________________________ Fruit ________________________________________________ Fat__________________________________________________ Evening Snack Food Group and Servings Food Choice Fruit _______________________________________________ Meat _______________________________________________ Starch ______________________________________________ Daily Totals Starches _________________________ Vegetables _______________________ Fruit ___________________________ Dairy ___________________________ Meat ____________________________ Fat _____________________________ Document Released: 07/26/2004 Document Re-Released: 06/23/2009 [employer] Patient Information 2011 [place], [place].   Set up appointment as needed.

## 2010-05-04 NOTE — Assessment & Plan Note (Signed)
Patient was given a 2000-calorie diet chart. She was also advised to buy a scale and monitor her weight daily. Patient says that she is about 80 pounds up her ideal body weight and wants to lose it in one year. Extensive counseling was done regarding setting up goals and working towards weight loss.

## 2010-05-04 NOTE — Assessment & Plan Note (Signed)
A urine dipstick was done today which was completely clean.

## 2010-05-04 NOTE — Progress Notes (Signed)
  Subjective:    Patient ID: April Mathis, female    DOB: August 26, 1950, 60 y.o.   MRN: [id number]  HPI April Mathis is a 60 year old female with past medical history as noted. She was seen in the clinic on April 10 for nausea vomiting. This is a followup appointment as patient was treated for nausea vomiting as well as a urinary tract infection presumptively.  The patient denies nausea or vomiting. Patient denies any urinary complaint. Patient just had a bowel movement though she was complaining of constipation as the presenting complaint.  Patient is really worried about her weight. I advised her to buy having scale and keep a check on her weight every day.  No other complaints.      Review of Systems  Constitutional: Negative for fever, activity change and appetite change.  HENT: Negative for sore throat.   Respiratory: Negative for cough and shortness of breath.   Cardiovascular: Negative for chest pain and leg swelling.  Gastrointestinal: Negative for nausea, abdominal pain, diarrhea, constipation and abdominal distention.  Genitourinary: Negative for frequency, hematuria and difficulty urinating.  Neurological: Negative for dizziness and headaches.  Psychiatric/Behavioral: Negative for suicidal ideas and behavioral problems.       Objective:   Physical Exam  Constitutional: She is oriented to person, place, and time. She appears well-developed and well-nourished.  HENT:  Head: Normocephalic and atraumatic.  Eyes: Conjunctivae and EOM are normal. Pupils are equal, round, and reactive to light. No scleral icterus.  Neck: Normal range of motion. Neck supple. No JVD present. No thyromegaly present.  Cardiovascular: Normal rate, regular rhythm, normal heart sounds and intact distal pulses.  Exam reveals no gallop and no friction rub.   No murmur heard. Pulmonary/Chest: Effort normal and breath sounds normal. No respiratory distress. She has no wheezes. She has no rales.  Abdominal:  Soft. Bowel sounds are normal. She exhibits no distension and no mass. There is no tenderness. There is no rebound and no guarding.  Musculoskeletal: Normal range of motion. She exhibits no edema and no tenderness.  Lymphadenopathy:    She has no cervical adenopathy.  Neurological: She is alert and oriented to person, place, and time.  Psychiatric: She has a normal mood and affect. Her behavior is normal.          Assessment & Plan:

## 2010-05-17 ENCOUNTER — Telehealth: Payer: Self-pay | Admitting: *Deleted

## 2010-05-17 NOTE — Telephone Encounter (Addendum)
Call from pt would like to get a Glucose meter if possible.  York Spaniel that she has been told that she is  pre-Diabetic and would like to start checking her sugars occasionally.  Pt also said that she needs to talk with Dr. Eben Burow about her blood pressure medications.  Is concerned about the change in the medications/addition of new medication.  Uses [facility] on [address].

## 2010-05-18 NOTE — Telephone Encounter (Signed)
Patient does not need the glucose strips or meter as she is not diabetic. Patient was not started on any new meds, rather her old HCTZ was restarted in the last office visit. Its usually late until I get free from night float and would try to contact patient in AM after the duty hours.

## 2010-05-25 ENCOUNTER — Telehealth: Payer: Self-pay | Admitting: *Deleted

## 2010-05-25 NOTE — Telephone Encounter (Signed)
Pt calls and states no one called her back and she is stressed about all these changes that dr garg made in her medicines and telling her she is a diabetic but won't give her a meter, she states she needs a meter to check her blood sugar because it could be going way up and way down, she states " you people just can't play with somebody's life like that". i attempted to explain that her blood glucose was slightly elevated (113) and that making some lifestyle changes would possibly reverse it to normal limits and also that medicaid would not pay for a meter for prediabetes but that i would make her an appt to come in and speak w/ dr Reche Dixon since dr garg is on night float at present and that it would be best for a md to talk to her and explain meds and blood sugars. appt is made for 5/9 at 1315.

## 2010-05-25 NOTE — Telephone Encounter (Signed)
Dear Dr Reche Dixon, I have gone this path with this patient in the past and have talked to her in detail about her condition and hence need for lifestyle modifications for being prediabetic. This is likely her 4-6th visit for the exact same concern. She has very limited intelligence and easily aggravated and even hostile at office visits. Thank you for agreeing to see the patient.

## 2010-05-25 NOTE — Telephone Encounter (Signed)
Agree with Appt.

## 2010-05-26 ENCOUNTER — Ambulatory Visit: Payer: Medicaid Other | Admitting: Internal Medicine

## 2010-05-31 NOTE — Telephone Encounter (Signed)
Patient did not show up for her visit.

## 2010-07-05 ENCOUNTER — Ambulatory Visit (INDEPENDENT_AMBULATORY_CARE_PROVIDER_SITE_OTHER): Payer: Medicaid Other | Admitting: Internal Medicine

## 2010-07-05 ENCOUNTER — Encounter: Payer: Self-pay | Admitting: Internal Medicine

## 2010-07-05 DIAGNOSIS — N3946 Mixed incontinence: Secondary | ICD-10-CM

## 2010-07-05 DIAGNOSIS — R7309 Other abnormal glucose: Secondary | ICD-10-CM

## 2010-07-05 DIAGNOSIS — N390 Urinary tract infection, site not specified: Secondary | ICD-10-CM

## 2010-07-05 DIAGNOSIS — R32 Unspecified urinary incontinence: Secondary | ICD-10-CM

## 2010-07-05 DIAGNOSIS — J019 Acute sinusitis, unspecified: Secondary | ICD-10-CM

## 2010-07-05 DIAGNOSIS — E669 Obesity, unspecified: Secondary | ICD-10-CM

## 2010-07-05 DIAGNOSIS — I10 Essential (primary) hypertension: Secondary | ICD-10-CM

## 2010-07-05 LAB — URINALYSIS, ROUTINE W REFLEX MICROSCOPIC
Glucose, UA: NEGATIVE mg/dL
Hgb urine dipstick: NEGATIVE
Ketones, ur: NEGATIVE mg/dL
Leukocytes, UA: NEGATIVE
pH: 6.5 (ref 5.0–8.0)

## 2010-07-05 MED ORDER — GUAIFENESIN-CODEINE 100-10 MG/5ML PO SYRP: 5.0000 mL | ORAL_SOLUTION | Freq: Three times a day (TID) | ORAL | Status: AC | PRN

## 2010-07-05 MED ORDER — DOXYCYCLINE HYCLATE 100 MG PO TABS: 100.0000 mg | ORAL_TABLET | Freq: Two times a day (BID) | ORAL | Status: AC

## 2010-07-05 NOTE — Patient Instructions (Signed)
Schedule a follow up appointment  with Dr. Arvilla Market in 3 months sooner if needed. Doxycycline as an antibiotic to treat her sinus infection. Be sure to take all of the medication as directed for the full 10 days. I will call you if any other lab work is abnormal. Will refer you to a urologist today Avoid caffeine. It can over-stimulate the bladder. Use the bathroom regularly.  Try about every 2-3 hours even if you do not feel the need. Take time to empty your bladder completely.  After urinating, wait a minute. Then try to urinate again.

## 2010-07-05 NOTE — Assessment & Plan Note (Signed)
Patient reports symptoms consistent with both stress and urge incontinence; symptoms of stress incontinence predominate.  As discussed elsewhere, her symptoms may be acutely worse as a result of urinary tract infection therefore will test for this today.  I will defer any treatment for incontinence at this time.  A significant amount of time was devoted to discussing behaviorial habits to help with incontinence; ie. Voiding every 2-3 hours, avoiding caffeinated beverages, no fluids after 5 PM, etc.  45 minutes with at least 50% face-to-face time was spent counseling patient about her numerous medical concerns and chronic medical conditions well as coordinating care with other health professionals.

## 2010-07-05 NOTE — Progress Notes (Signed)
Subjective:    Patient ID: April Mathis, female    DOB: May 16, 1950, 60 y.o.   MRN: [id number]  HPI  Pt reports increased nasal congestion, increased cough, and left ear pain but no ear drainage.  Her cough is productive of green phlegm.  She has had symptoms for over one week.  She reports come sinus pressure and post nasal drip.  Her cough is keeping her awake at night.  She denies fevers.  She admits to nausea and vomitng 2-3x over the last 2 weeks; he last episode was 2-3 days.  Her emesis was mostly food and mucous; vomiting occurred after coughing. Denies sore throat.  Admits to body aches and sick contacts.  She reports increased urination and incontinence after coughing.  She has only recently noticed these symptoms with the onset of her cold.  She denies any dysuria,  She admits to urgency.  She reports a previous referral to urology for evaluation of recurrent urinary tract infections; she has not scheduled this appointment.  States her symptoms are somewhat similar to previous UTIs.  She notes that she does not like the HCTZ and is concerned as is continue to apparent her medication list. He is tolerating the lisinopril well.  She is very concerned that she has diabetes and is confused about whether diagnosis is been made or not.  She states she has numerous problems affording medications and is not sure how she should get her prescriptions.  Review of Systems  Constitutional: Positive for malaise/fatigue. Negative for fever, chills, weight loss and diaphoresis.  HENT: Positive for ear pain and congestion. Negative for neck pain, tinnitus and ear discharge.   Eyes: Negative for blurred vision, photophobia, pain, discharge and redness.  Respiratory: Positive for cough and sputum production. Negative for hemoptysis, shortness of breath and wheezing.   Cardiovascular: Negative for chest pain and PND.  Gastrointestinal: Positive for nausea and vomiting. Negative for abdominal pain,  diarrhea, constipation, blood in stool and melena.  Genitourinary: Positive for urgency and frequency. Negative for dysuria, hematuria and flank pain.  Musculoskeletal: Negative for back pain.  Skin: Negative for rash.  Neurological: Negative for dizziness, tremors, seizures and headaches.   Review of Systems  Constitutional: Positive for malaise/fatigue. Negative for fever, chills, weight loss and diaphoresis.  HENT: Positive for ear pain and congestion. Negative for neck pain, tinnitus and ear discharge.   Eyes: Negative for blurred vision, photophobia, pain, discharge and redness.  Respiratory: Positive for cough and sputum production. Negative for hemoptysis, shortness of breath and wheezing.   Cardiovascular: Negative for chest pain and PND.  Gastrointestinal: Positive for nausea and vomiting. Negative for abdominal pain, diarrhea, constipation, blood in stool and melena.  Genitourinary: Positive for urgency and frequency. Negative for dysuria, hematuria and flank pain.  Musculoskeletal: Negative for back pain.  Skin: Negative for rash.  Neurological: Negative for dizziness, tremors, seizures and headaches.       Objective:   Physical Exam VItal signs reviewed and stable.  Blood pressure elevated above goal. GEN: No apparent distress.  Alert and oriented x 3.  Pleasant, conversant, and cooperative to exam. HEENT: head is autraumatic and normocephalic.  Neck is supple without palpable masses.  Mild cervical lymphadenopathy noted.  No JVD or carotid bruits.  Vision intact.  EOMI.  PERRLA.  Sclerae anicteric.  Conjunctivae without pallor or injection. Mucous membranes are moist.  Oropharynx is without erythema, exudates, or other abnormal lesions. Cerumen present in bilateral ear canals; otherwise canals within normal  limits. TM is within normal limits bilaterally. RESP: Bilateral coarse breath sounds; right greater than left.  Good air movement.  No wheezes, rubs, or  crackles. CARDIOVASCULAR: regular rate, normal rhythm.  Clear S1, S2, no murmurs, gallops, or rubs. ABDOMEN: soft, non-tender, non-distended.  Bowels sounds present in all quadrants and normoactive.  No palpable masses. EXT: warm and dry.  Peripheral pulses equal, intact, and +2 globally.  No clubbing or cyanosis.  No edema in bilateral lower extremities. SKIN: warm and dry with normal turgor.  No rashes or abnormal lesions observed. NEURO: CN II-XII grossly intact.  Muscle strength +5/5 in bilateral upper and lower extremities.  Sensation is grossly intact.  No focal deficit.        Assessment & Plan:

## 2010-07-05 NOTE — Assessment & Plan Note (Addendum)
Patient states that she no longer wants to take her HCTZ in it this was discontinued at her last visit. Is not interested in resuming therapy with HCTZ and is currently happy with her lisinopril. Her pressure is elevated above goal today. Unfortunately there was  not enough time for a full discussion of continued management of her hypertension and she is advised to followup to address this issue.

## 2010-07-05 NOTE — Assessment & Plan Note (Addendum)
Patient presents with symptoms consistent with sinusitis outlined in history of present illness. As she is having these symptoms for over 7 days and now reports a purulent cough, will treat with an empiric course of doxycycline for 10 days.  We'll also prescribe Cheratussin for relief of symptoms and improved sleep.  She is advised to return to the clinic if she develops worsening fever, worsening symptoms, or her symptoms do not resolve after the course of antibiotics.  She is advised to stop smoking as this will prolong her symptoms and increase her risk of recurrence, and increasse her risk of obstructive lung disease and cancer

## 2010-07-05 NOTE — Assessment & Plan Note (Signed)
Patient states she is interested in losing weight. Discussed healthy lifestyle habits including good nutrition and regular exercise. Will refer her to Dr. Victory Dakin for nutrition management.  She was also informed about monthly free healthy last classes and encouraged to attend.

## 2010-07-05 NOTE — Assessment & Plan Note (Signed)
Patient presents with episodes of incontinence as well as increased frequency. She denies dysuria; full symptoms and presentation are outlined in history of present illness. Will check a urinalysis today to evaluate for acute UTI. History of more than 3 urinary tract infections a year as well as new symptoms of incontinence will refer her to a urologist for additional workup.

## 2010-07-06 ENCOUNTER — Telehealth: Payer: Self-pay | Admitting: *Deleted

## 2010-07-06 NOTE — Telephone Encounter (Signed)
Returned pt's call.  Pt stated she only received 30 tablets of lisinopril instead of 90 tablets which is "on the bottle" so she has ran out. Stated she had tried Engineer, civil (consulting).  She has been out x 2-3 weeks. [facility] pharmacist, Jonny Ruiz, stated she needs to bring the bottle back to him. And she knows this but she stated I know they will not give "me my medicine".  Pt became upset when I informed her again to return the bottle per the pharmacist's request and then go from there.

## 2010-07-07 ENCOUNTER — Telehealth: Payer: Self-pay | Admitting: *Deleted

## 2010-07-07 DIAGNOSIS — I10 Essential (primary) hypertension: Secondary | ICD-10-CM

## 2010-07-07 MED ORDER — LISINOPRIL 10 MG PO TABS: 10.0000 mg | ORAL_TABLET | Freq: Every day | ORAL | Status: DC

## 2010-07-07 NOTE — Telephone Encounter (Signed)
I called pt to let her know about new Lisinopril rx sent to [facility]; she stated [facility] had just called her and they are willing to give her enough pills to last until July10 for free.

## 2010-07-07 NOTE — Telephone Encounter (Signed)
Called patient approximately 5: 15 P on 6/19;2012 to ask what she wanted the [facility] to do for her tonight. She spoke at length about suing [facility] for not giving her 90 pills on 04/27/2010 when she picked up her Lisinopril. She also shared she was not exactly sure when she ran out of her Lisinopril but was sure she had not taken any in 2 - 3 weeks, although she also stated she had taken it in April and May, so thought she might have had "60 pills."  This RN called the pharmacist at [facility] that stated the pt had been given 90 Lisinopril pills on April 10 and that her insurance would not pay for any more pills until July 10 ( or might let her refill 1 -2 days early.) [facility] stated that 30 pills purchased at their pharmacy with cash would cost $15.66. Patient contacted again and [facility] information reviewed with pt. She again threatened law suits of [facility] and eventually acknowledged that she could go to [facility] and get the same prescription for 90 days for $10. I offered to contact [facility] physician to see if we could get another Lisinopril Rx called into the [facility] on [address] as pt had requested - again for 90 days so she could maximize the # of pills for her money spent. I told pt I would call her after her physician had been contacted to let her know if we were able to call a 2nd Rx in to her 2nd pharmacy of choice, [facility] on [address].  Dorie Rank, RN, 07/07/2010, 9: 47 A

## 2010-07-07 NOTE — Telephone Encounter (Signed)
April Mathis talked to pt last night; see her note.

## 2010-07-07 NOTE — Telephone Encounter (Signed)
Pt has called about her Lisinopril; I told her I need to talk to the MD and I will call her back. She request 1 month supply sent to [facility] on [address] instead of the 3 month supply she originally requested; which will only cost $4.00.

## 2010-07-08 ENCOUNTER — Encounter: Payer: Medicaid Other | Admitting: Internal Medicine

## 2010-07-16 ENCOUNTER — Inpatient Hospital Stay (HOSPITAL_COMMUNITY)
Admission: AD | Admit: 2010-07-16 | Discharge: 2010-07-16 | Disposition: A | Discharge status: Home / Self Care | Payer: Medicare Other | Source: Ambulatory Visit | Attending: Obstetrics | Admitting: Obstetrics

## 2010-07-16 DIAGNOSIS — A6000 Herpesviral infection of urogenital system, unspecified: Secondary | ICD-10-CM | POA: Insufficient documentation

## 2010-07-16 DIAGNOSIS — N949 Unspecified condition associated with female genital organs and menstrual cycle: Secondary | ICD-10-CM | POA: Insufficient documentation

## 2010-07-16 DIAGNOSIS — N72 Inflammatory disease of cervix uteri: Secondary | ICD-10-CM

## 2010-07-16 LAB — URINALYSIS, ROUTINE W REFLEX MICROSCOPIC
Bilirubin Urine: NEGATIVE
Nitrite: NEGATIVE
Specific Gravity, Urine: <1.005 — ABNORMAL LOW (ref 1.005–1.030)
Urobilinogen, UA: 0.2 mg/dL (ref 0.0–1.0)

## 2010-07-16 LAB — WET PREP, GENITAL: Clue Cells Wet Prep HPF POC: NONE SEEN

## 2010-07-17 LAB — GC/CHLAMYDIA PROBE AMP, GENITAL: Chlamydia, DNA Probe: NEGATIVE

## 2010-07-19 LAB — HERPES SIMPLEX VIRUS CULTURE

## 2010-07-26 ENCOUNTER — Telehealth: Payer: Self-pay | Admitting: *Deleted

## 2010-07-26 NOTE — Telephone Encounter (Signed)
Thank you Myriam Jacobson.  Satoru Milich

## 2010-07-26 NOTE — Telephone Encounter (Signed)
Pt calls c/o whether she is a diab or not, she states "you people are suppose to be the professionals and no 2 of you can get the same idea" she yells and states her BP is up because of the stress that people put her through. i explained that she has potential to develop diabetes in the future because of her other health concerns such as HTN, being overweight and the mds would like to help her not develop diabetes so through lifestyle changes she could accomplish that and in order to achieve that an appt w/ donnar. Would be appropriate. She then calmed down a little and stated no one had explained these things to her. She was given appts for md and donna by sharonb.

## 2010-07-28 ENCOUNTER — Ambulatory Visit: Payer: Medicaid Other | Admitting: Dietician

## 2010-07-28 ENCOUNTER — Telehealth: Payer: Self-pay | Admitting: Dietician

## 2010-07-28 NOTE — Telephone Encounter (Signed)
Asked by triage nurse to call patient regarding her prediabetes. Patient is scheduled to meet with CDE, but do not think her insurance covers training for prediabetes.  Left message for patient to return call to discuss

## 2010-08-05 NOTE — Assessment & Plan Note (Signed)
A very lengthy discussion was had with the patient about diabetes her hemoglobin A1c her last visit was within normal limits and again that she does not have diabetes. Patient was informed of this and she stated that she is concerned that this may not be the result of her personal lab test.  Hemoglobin A1c was repeated today and is again within normal limits. Patient was informed that she does not have diabetes and does not need to check her blood sugars on a regular basis, nor does she need any treatment for diabetes.  She is encouraged to maintain a healthy lifestyle with regular exercise and a diet low in fat and sugars to decrease her risk of developing diabetes.

## 2010-08-09 ENCOUNTER — Telehealth: Payer: Self-pay | Admitting: *Deleted

## 2010-08-09 NOTE — Telephone Encounter (Signed)
Pt calls and ask for referral to gyn, she is ask to please tell md wed 7/25 appt that she has made this appt and needs referral processed. She becomes upset and says "what's the big deal", i explained that she needs to let the md know she has made the appt and needs md approval for referral, she then apologizes and call ends

## 2010-08-11 ENCOUNTER — Ambulatory Visit (INDEPENDENT_AMBULATORY_CARE_PROVIDER_SITE_OTHER): Payer: Medicare Other | Admitting: Internal Medicine

## 2010-08-11 ENCOUNTER — Ambulatory Visit: Payer: Medicare Other | Admitting: Dietician

## 2010-08-11 ENCOUNTER — Encounter: Payer: Self-pay | Admitting: Internal Medicine

## 2010-08-11 VITALS — BP 121/83 | HR 74 | Temp 97.1°F | Ht 62.0 in | Wt 206.8 lb

## 2010-08-11 DIAGNOSIS — N3946 Mixed incontinence: Secondary | ICD-10-CM

## 2010-08-11 DIAGNOSIS — I10 Essential (primary) hypertension: Secondary | ICD-10-CM

## 2010-08-11 DIAGNOSIS — Z0000 Encounter for general adult medical examination without abnormal findings: Secondary | ICD-10-CM

## 2010-08-11 DIAGNOSIS — I872 Venous insufficiency (chronic) (peripheral): Secondary | ICD-10-CM

## 2010-08-11 DIAGNOSIS — F17200 Nicotine dependence, unspecified, uncomplicated: Secondary | ICD-10-CM

## 2010-08-11 DIAGNOSIS — N72 Inflammatory disease of cervix uteri: Secondary | ICD-10-CM

## 2010-08-11 DIAGNOSIS — R7309 Other abnormal glucose: Secondary | ICD-10-CM

## 2010-08-11 MED ORDER — ZOSTER VACCINE LIVE 19400 UNT/0.65ML ~~LOC~~ SOLR: 0.6500 mL | Freq: Once | SUBCUTANEOUS | Status: DC

## 2010-08-11 MED ORDER — NICOTINE POLACRILEX 2 MG MT GUM: 2.0000 mg | CHEWING_GUM | OROMUCOSAL | Status: AC | PRN

## 2010-08-11 MED ORDER — LISINOPRIL 10 MG PO TABS: 10.0000 mg | ORAL_TABLET | Freq: Every day | ORAL | Status: DC

## 2010-08-11 NOTE — Assessment & Plan Note (Signed)
Patient's symptoms of chronic unilateral leg swelling with enlarged visible veins and foot hyperpigmentation are most suggestive of chronic venous insufficiency -patient's symptoms are not currently interfering with her activities of daily living -may pursue further work-up with duplex ultrasound in the future, if the patient decides that it is impacting her quality of life

## 2010-08-11 NOTE — Patient Instructions (Signed)
For your smoking cessation, we have prescribed nicotine gum. -please follow-up with our social worker for smoking cessation tips  We have given you 3 cards to detect for blood in your stool. -use 1 card each for 3 separate bowel movements, and return the cards to our office  Please follow-up with your OB/GYN doctor, Dr. [name], for follow-up for cervicitis  Please follow-up with Dr. Lajoyce Corners for your knee pain.  Smoking Cessation This document explains the best ways for you to quit smoking and new treatments to help. It lists new medicines that can double or triple your chances of quitting and quitting for good. It also considers ways to avoid relapses and concerns you may have about quitting, including weight gain. NICOTINE: A POWERFUL ADDICTION If you have tried to quit smoking, you know how hard it can be. It is hard because nicotine is a very addictive drug. For some people, it can be as addictive as heroin or cocaine. Usually, people make 2 or 3 tries, or more, before finally being able to quit. Each time you try to quit, you can learn about what helps and what hurts. Quitting takes hard work and a lot of effort, but you can quit smoking. QUITTING SMOKING IS ONE OF THE MOST IMPORTANT THINGS YOU WILL EVER DO:  You will live longer, feel better, and live better.   The impact on your body of quitting smoking is felt almost immediately:   Within 20 minutes, blood pressure decreases. Pulse returns to its normal level.   After 8 hours, carbon monoxide levels in the blood return to normal. Oxygen level increases.   After 24 hours, chance of heart attack starts to decrease. Breath, hair, and body stop smelling like smoke.   After 48 hours, damaged nerve endings begin to recover. Sense of taste and smell improve.   After 72 hours, the body is virtually free of nicotine. Bronchial tubes relax and breathing becomes easier.   After 2 to 12 weeks, lungs can hold more air. Exercise becomes easier  and circulation improves.   Quitting will lower your chance of having a heart attack, stroke, cancer, or lung disease:   After 1 year, the risk of coronary heart disease is cut in half.   After 5 years, the risk of stroke falls to the same as a nonsmoker.   After 10 years, the risk of lung cancer is cut in half and the risk of other cancers decreases significantly.   After 15 years, the risk of coronary heart disease drops, usually to the level of a nonsmoker.   If you are pregnant, quitting smoking will improve your chances of having a healthy baby.   The people you live with, especially your children, will be healthier.   You will have extra money to spend on things other than cigarettes.  FIVE KEYS TO QUITTING Studies have shown that these 5 steps will help you quit smoking and quit for good. You have the best chances of quitting if you use them together: 1. Get ready.  2. Get support and encouragement.  3. Learn new skills and behaviors.  4. Get medicine to reduce your nicotine addiction and use it correctly.  5. Be prepared for relapse or difficult situations. Be determined to continue trying to quit, even if you do not succeed at first.  1. GET READY  Set a quit date.   Change your environment.   Get rid of ALL cigarettes, ashtrays, matches, and lighters in your home, car,  and place of work.   Do not let people smoke in your home.   Review your past attempts to quit. Think about what worked and what did not.   Once you quit, do not smoke. NOT EVEN A PUFF!  2. GET SUPPORT AND ENCOURAGEMENT Studies have shown that you have a better chance of being successful if you have help. You can get support in many ways.  Tell your family, friends, and coworkers that you are going to quit and need their support. Ask them not to smoke around you.   Talk to your caregivers (doctor, dentist, nurse, pharmacist, psychologist, and/or smoking counselor).   Get individual, group, or  telephone counseling and support. The more counseling you have, the better your chances are of quitting. Programs are available at [employer] and health centers. Call your local health department for information about programs in your area.   Spiritual beliefs and practices may help some smokers quit.   Quit meters are Photographer that keep track of quit statistics, such as amount of "quit-time," cigarettes not smoked, and money saved.   Many smokers find one or more of the many self-help books available useful in helping them quit and stay off tobacco.  3. LEARN NEW SKILLS AND BEHAVIORS  Try to distract yourself from urges to smoke. Talk to someone, go for a walk, or occupy your time with a task.   When you first try to quit, change your routine. Take a different route to work. Drink tea instead of coffee. Eat breakfast in a different place.   Do something to reduce your stress. Take a hot bath, exercise, or read a book.   Plan something enjoyable to do every day. Reward yourself for not smoking.   Explore interactive web-based programs that specialize in helping you quit.  4. GET MEDICINE AND USE IT CORRECTLY Medicines can help you stop smoking and decrease the urge to smoke. Combining medicine with the above behavioral methods and support can quadruple your chances of successfully quitting smoking. The U[place]. Food and Drug Administration (FDA) has approved 7 medicines to help you quit smoking. These medicines fall into 3 categories.  Nicotine replacement therapy (delivers nicotine to your body without the negative effects and risks of smoking):   Nicotine gum: Available over-the-counter.   Nicotine lozenges: Available over-the-counter.   Nicotine inhaler: Available by prescription.   Nicotine nasal spray: Available by prescription.   Nicotine skin patches (transdermal): Available by prescription and over-the-counter.   Antidepressant medicine  (helps people abstain from smoking, but how this works is unknown):   Bupropion sustained-release (SR) tablets: Available by prescription.   Nicotinic receptor partial agonist (simulates the effect of nicotine in your brain):   Varenicline tartrate tablets: Available by prescription.   Ask your caregiver for advice about which medicines to use and how to use them. Carefully read the information on the package.   Everyone who is trying to quit may benefit from using a medicine. If you are pregnant or trying to become pregnant, nursing an infant, you are under age 31, or you smoke fewer than 10 cigarettes per day, talk to your caregiver before taking any nicotine replacement medicines.   You should stop using a nicotine replacement product and call your caregiver if you experience nausea, dizziness, weakness, vomiting, fast or irregular heartbeat, mouth problems with the lozenge or gum, or redness or swelling of the skin around the patch that does not go away.  Do not use any other product containing nicotine while using a nicotine replacement product.   Talk to your caregiver before using these products if you have diabetes, heart disease, asthma, stomach ulcers, you had a recent heart attack, you have high blood pressure that is not controlled with medicine, a history of irregular heartbeat, or you have been prescribed medicine to help you quit smoking.  5. BE PREPARED FOR RELAPSE OR DIFFICULT SITUATIONS  Most relapses occur within the first 3 months after quitting. Do not be discouraged if you start smoking again. Remember, most people try several times before they finally quit.   You may have symptoms of withdrawal because your body is used to nicotine. You may crave cigarettes, be irritable, feel very hungry, cough often, get headaches, or have difficulty concentrating.   The withdrawal symptoms are only temporary. They are strongest when you first quit, but they will go away within 10 to  14 days.  Here are some difficult situations to watch for:  Alcohol. Avoid drinking alcohol. Drinking lowers your chances of successfully quitting.   Caffeine. Try to reduce the amount of caffeine you consume. It also lowers your chances of successfully quitting.   Other smokers. Being around smoking can make you want to smoke. Avoid smokers.   Weight gain. Many smokers will gain weight when they quit, usually less than 10 pounds. Eat a healthy diet and stay active. Do not let weight gain distract you from your main goal, quitting smoking. Some medicines that help you quit smoking may also help delay weight gain. You can always lose the weight gained after you quit.   Bad mood or depression. There are a lot of ways to improve your mood other than smoking.  If you are having problems with any of these situations, talk to your caregiver. SPECIAL SITUATIONS OR CONDITIONS Studies suggest that everyone can quit smoking. Your situation or condition can give you a special reason to quit.  Pregnant women/New mothers: By quitting, you protect your baby's health and your own.   Hospitalized patients: By quitting, you reduce health problems and help healing.   Heart attack patients: By quitting, you reduce your risk of a second heart attack.   Lung, head, and neck cancer patients: By quitting, you reduce your chance of a second cancer.   Parents of children and adolescents: By quitting, you protect your children from illnesses caused by secondhand smoke.  QUESTIONS TO THINK ABOUT Think about the following questions before you try to stop smoking. You may want to talk about your answers with your caregiver.  Why do you want to quit?   If you tried to quit in the past, what helped and what did not?   What will be the most difficult situations for you after you quit? How will you plan to handle them?   Who can help you through the tough times? Your family? Friends? Caregiver?   What pleasures  do you get from smoking? What ways can you still get pleasure if you quit?  Here are some questions to ask your caregiver:  How can you help me to be successful at quitting?   What medicine do you think would be best for me and how should I take it?   What should I do if I need more help?   What is smoking withdrawal like? How can I get information on withdrawal?  Quitting takes hard work and a lot of effort, but you can quit smoking. FOR  MORE INFORMATION Smokefree.gov (http://www.davis-sullivan.com/) provides free, accurate, evidence-based information and professional assistance to help support the immediate and long-term needs of people trying to quit smoking. Document Released: 12/28/2000 Document Re-Released: 06/23/2009 [facility] Patient Information 2011 [place], [place].

## 2010-08-11 NOTE — Assessment & Plan Note (Signed)
A large amount of time was spent explaining the process of diabetes, and the distinction between diabetes and prediabetes.  The patient appeared much more relieved after our discussion.  However, a review of prior notes and telephone calls revealed that she has expressed this concern before, and has had this concept explained to her in detail in the past as well. -I encouraged the patient to continue her current lifestyle changes of diet and exercise

## 2010-08-11 NOTE — Progress Notes (Signed)
Follow-up Visit  HPI The patient is a 60 yo woman with a history of HTN, DJD of the knee, prediabetes, and urinary incontenence, presenting for a follow-up visit.    The patient expresses confusion about her diagnosis of prediabetes, and asks "do I have diabetes or not".  She also notes that she has lost 6 pounds since her last appointment, due to increased walking and decreased sugar intake.    She notes a history of urinary incontinence, presenting as leakage of urine when her bladder is full or when she coughs or laughs.  She was advised at her last appointment of some lifestyle modifications, such as urinating frequently, avoiding drinking caffeinated beverages at night, etc., which she notes have helped control her incontinence.  She recently had an appointment with Urology, and is scheduled to return for further work-up early next month.    She notes a history of chronic left leg swelling for 5-10 years, previously managed with Lasix, though her Lasix has been recently discontinued due to exacerbation of urinary incontinence.  She also notes enlarged veins on the ventral surface of her legs, and some scattered areas of hyperpigmentation on her left foot.  She notes no previous diagnosis of DVT, and no new or focal leg swelling, hyperpigmentation, or warmth.  She notes an ED visit 07/16/10 for cervicitis, with a negative work-up for herpes, chlamydia, gonorrhea, or vaginitis/vaginosis.  Patient given a course of doxycycline, which she completed, with no further pelvic pain, vaginal itching, or discharge.  She is set to follow-up with her OB/GYN 08/23/10, but needs a referral from our office.  She has a history of tobacco use, currently smoking about 3-4 cigarettes per day, though she expresses interest in quitting.  She has never tried quitting before, but has been successful in cutting back on her cigarette use in the past.  ROS: General: no fevers, chills, changes in weight, changes in  appetite Skin: no rash HEENT: no blurry vision, hearing changes, sore throat Pulm: no dyspnea, coughing, wheezing CV: no chest pain, palpitations, shortness of breath Abd: no abdominal pain, nausea/vomiting, diarrhea/constipation GU: no dysuria, hematuria, polyuria Ext: no arthralgias, myalgias Neuro: no weakness, numbness, or tingling  Filed Vitals:   08/11/10 1408  BP: 121/83  Pulse: 74  Temp: 97.1 F (36.2 C)    PEX General: alert, cooperative, and in no apparent distress HEENT: pupils equal round and reactive to light, vision grossly intact, oropharynx clear and non-erythematous  Neck: supple, no lymphadenopathy, JVD, or carotid bruits Lungs: clear to ascultation bilaterally, normal work of respiration, no wheezes, rales, ronchi Heart: regular rate and rhythm, no murmurs, gallops, or rubs Abdomen: soft, non-tender, non-distended, normal bowel sounds Msk: no joint edema, warmth, or erythema Extremities: Left leg mildly edematous to the level of the thigh.  Enlarged veins noted on ventral surface of leg.  Many small, splotchy areas of hyperpigmentation on medial foot and ankle.  No focal convalescence of hyperpigmentation, erythema, or warmth. Neurologic: alert & oriented X3, cranial nerves II-XII intact, strength grossly intact, sensation intact to light touch  Assessment/Plan

## 2010-08-11 NOTE — Assessment & Plan Note (Signed)
Currently well-controlled on lisinopril, with BP today = 121/83 -continue current regimen

## 2010-08-11 NOTE — Assessment & Plan Note (Signed)
Symptoms improved with lifestyle changes -patient currently followed by urology, with an upcoming appointment scheduled

## 2010-08-11 NOTE — Assessment & Plan Note (Signed)
Cervicitis of unknown origin, with negative tests for herpes, gonorrhea/chlamydia, or vaginitis.  Treated with a course of doxycycline, currently resolved -will follow-up with patient's OB/GYN, Dr. [name], August 6th, referral sent.

## 2010-08-11 NOTE — Assessment & Plan Note (Signed)
The patient agreed that "I need to quit smoking", and is interested in trying some tobacco replacement products -per the patient's request, she was prescribed nicotine gum -will schedule referral to social work for other smoking cessation tips -we will re-evaluate progress at next visit, and may pursue other options such as chantix or wellbutrin

## 2010-08-11 NOTE — Assessment & Plan Note (Signed)
-  Patient declines colonoscopy at this time.  Patient given fecal occult blood test cards -patient given a prescription for zostavax

## 2010-08-12 NOTE — Progress Notes (Signed)
I saw patient and discussed her care with resident Dr. Brown.  I agree with the clinical findings and plans as outlined in his note.  

## 2010-09-07 ENCOUNTER — Telehealth: Payer: Self-pay | Admitting: Licensed Clinical Social Worker

## 2010-09-13 ENCOUNTER — Telehealth: Payer: Self-pay | Admitting: Licensed Clinical Social Worker

## 2010-09-13 NOTE — Telephone Encounter (Signed)
Telephone call with patient in response to referral from Dr. Eben Burow re: smoking cessation.   The patient stated that she has too many stressors to quit smoking right now.  She reported many problems including low finances and outstanding [employer] certified [employer].  She did price the nicotine gum but cannot afford it.   Her check from [employer]. Security is around $750 and she is having difficulty making ends meet.  The patient has Medicare and Medicaid.    I told the patient I would send resource information to her home and so I will send quitline info, food pantry info and anything else I can think of that may help her with expenses.   I was unable to do any smoking cessation education over the phone as she talked extensively about her low Soc. Security check, problems with [employer], and how she doesn't trust corporations.     She said she will come back in October to see her doctor.

## 2010-10-13 LAB — BASIC METABOLIC PANEL
BUN: 15
Chloride: 100
Potassium: 4.4
Sodium: 137

## 2010-10-13 LAB — CBC
HCT: 36.9
Hemoglobin: 12.4
MCV: 86.8
RBC: 4.25
WBC: 6.2

## 2010-10-13 LAB — HEPATIC FUNCTION PANEL
ALT: 26
Alkaline Phosphatase: 48
Bilirubin, Direct: 0.1
Indirect Bilirubin: 0.5
Total Bilirubin: 0.6

## 2010-11-03 ENCOUNTER — Emergency Department (HOSPITAL_COMMUNITY)
Admission: EM | Admit: 2010-11-03 | Discharge: 2010-11-03 | Disposition: A | Discharge status: Home / Self Care | Payer: Medicare Other | Attending: Emergency Medicine | Admitting: Emergency Medicine

## 2010-11-03 DIAGNOSIS — M542 Cervicalgia: Secondary | ICD-10-CM | POA: Insufficient documentation

## 2010-11-03 DIAGNOSIS — I10 Essential (primary) hypertension: Secondary | ICD-10-CM | POA: Insufficient documentation

## 2010-11-03 DIAGNOSIS — F17200 Nicotine dependence, unspecified, uncomplicated: Secondary | ICD-10-CM | POA: Insufficient documentation

## 2010-11-05 ENCOUNTER — Encounter: Payer: Self-pay | Admitting: Internal Medicine

## 2010-11-05 ENCOUNTER — Ambulatory Visit (INDEPENDENT_AMBULATORY_CARE_PROVIDER_SITE_OTHER): Payer: Medicare Other | Admitting: Internal Medicine

## 2010-11-05 VITALS — BP 128/78 | HR 74 | Temp 97.3°F | Ht 62.0 in | Wt 201.3 lb

## 2010-11-05 DIAGNOSIS — I10 Essential (primary) hypertension: Secondary | ICD-10-CM

## 2010-11-05 DIAGNOSIS — F17200 Nicotine dependence, unspecified, uncomplicated: Secondary | ICD-10-CM

## 2010-11-05 DIAGNOSIS — Z23 Encounter for immunization: Secondary | ICD-10-CM

## 2010-11-05 DIAGNOSIS — R7309 Other abnormal glucose: Secondary | ICD-10-CM

## 2010-11-05 DIAGNOSIS — Z0000 Encounter for general adult medical examination without abnormal findings: Secondary | ICD-10-CM

## 2010-11-05 LAB — POCT GLYCOSYLATED HEMOGLOBIN (HGB A1C): Hemoglobin A1C: 5.6

## 2010-11-05 MED ORDER — LISINOPRIL 10 MG PO TABS: 10.0000 mg | ORAL_TABLET | Freq: Every day | ORAL | Status: DC

## 2010-11-05 MED ORDER — VARENICLINE TARTRATE 0.5 MG PO TABS: 0.5000 mg | ORAL_TABLET | Freq: Two times a day (BID) | ORAL | Status: DC

## 2010-11-05 MED ORDER — HYDROCHLOROTHIAZIDE 12.5 MG PO CAPS: 12.5000 mg | ORAL_CAPSULE | ORAL | Status: DC

## 2010-11-05 NOTE — Patient Instructions (Signed)
Please follow up in 3 months.  Please take all medications as prescribed.

## 2010-11-05 NOTE — Assessment & Plan Note (Signed)
Lab Results  Component Value Date   NA 140 04/27/2010   K 3.9 04/27/2010   CL 102 04/27/2010   CO2 29 07/28/2009   BUN 11 04/27/2010   CREATININE 0.6 04/27/2010    BP Readings from Last 3 Encounters:  11/05/10 128/78  08/11/10 121/83  07/05/10 159/79    Assessment: Hypertension control:  controlled  Progress toward goals:  at goal Barriers to meeting goals:  no barriers identified  Plan: Hypertension treatment:  continue current medications

## 2010-11-05 NOTE — Assessment & Plan Note (Signed)
Flu shot refused. Mammogram up to date PAP Smear done at [facility] earlier this year - WNL

## 2010-11-05 NOTE — Assessment & Plan Note (Signed)
Lab Results  Component Value Date   HGBA1C 5.9 07/05/2010   Pt would like A1C checked today for further evaluation to determine if she is diabetic.

## 2010-11-05 NOTE — Assessment & Plan Note (Signed)
Has been smoking since 1980, when she lost her son in MVA. Pt smokes 5-10 cigarettes daily. Pt has cut down to 3 cigarettes per week, but has never quit before. Patient states she smokes out of stress. Has never tried nicotine patches or gum 2/2 cost. Pt states she has a good support network to encourage her, but lives with people who also smoke. However, she says they will smoke outside. Treatment options discussed with pt including chantix and pt states she is willing to try it.

## 2010-11-05 NOTE — Progress Notes (Signed)
  Subjective:    Patient ID: April Mathis, female    DOB: [birth year], 60 y.o.   MRN: [id number]  HPI  April Mathis is a 60 year old woman with pmh significant for HTN, urinary incontinence, and Pre-diabetes who presents for general check-up.  1.) HTN - Pt was seen at [facility] this past Wednesday for elevated BP. Pt had pain in the back of neck and head, which prompted her to go to [facility].  2.) Urge Urinary incontinence - Pt recently saw urology to discuss urinary incontinence, and was prescribed Vesicare, which does help some.   3.) HTN - is on HCTZ for the last 10 years but patient states she stopped taking in setting of urinary incontinence. She says she will take it occasionally for LE edema.   Patient has no other complaints or concerns today. She denies chest pain, cough, sob, headache, N/V, changes in abdominal and urinary character.   Review of Systems  All other systems reviewed and are negative.       Objective:   Physical Exam        Assessment & Plan:

## 2010-11-06 LAB — BASIC METABOLIC PANEL
BUN: 13 mg/dL (ref 6–23)
Calcium: 9.8 mg/dL (ref 8.4–10.5)
Creat: 0.63 mg/dL (ref 0.50–1.10)
Glucose, Bld: 83 mg/dL (ref 70–99)

## 2010-11-08 ENCOUNTER — Encounter: Payer: Medicare Other | Admitting: Internal Medicine

## 2010-12-07 ENCOUNTER — Encounter: Payer: Self-pay | Admitting: Internal Medicine

## 2010-12-07 ENCOUNTER — Ambulatory Visit (INDEPENDENT_AMBULATORY_CARE_PROVIDER_SITE_OTHER): Payer: Medicare Other | Admitting: Internal Medicine

## 2010-12-07 VITALS — BP 130/78 | HR 86 | Temp 97.1°F | Ht 62.0 in | Wt 200.4 lb

## 2010-12-07 DIAGNOSIS — M1710 Unilateral primary osteoarthritis, unspecified knee: Secondary | ICD-10-CM

## 2010-12-07 DIAGNOSIS — R7309 Other abnormal glucose: Secondary | ICD-10-CM

## 2010-12-07 DIAGNOSIS — F17200 Nicotine dependence, unspecified, uncomplicated: Secondary | ICD-10-CM

## 2010-12-07 DIAGNOSIS — I10 Essential (primary) hypertension: Secondary | ICD-10-CM

## 2010-12-07 DIAGNOSIS — R609 Edema, unspecified: Secondary | ICD-10-CM

## 2010-12-07 MED ORDER — VARENICLINE TARTRATE 0.5 MG PO TABS: 0.5000 mg | ORAL_TABLET | Freq: Two times a day (BID) | ORAL | Status: DC

## 2010-12-07 NOTE — Patient Instructions (Signed)
Thank you for coming for appointment. Keep working towards your weight loss. Stop taking HCTZ. Blood tests today.

## 2010-12-07 NOTE — Assessment & Plan Note (Signed)
As patient is losing weight her blood pressure is much better controlled. I would discontinue hydrochlorothiazide from her med list given that she has urinary incontinence.

## 2010-12-07 NOTE — Assessment & Plan Note (Signed)
Patient was advised to keep losing weight as it'll help her to delay the progression of the disease.

## 2010-12-07 NOTE — Assessment & Plan Note (Signed)
Patient was advised compression stockings. She is going to buy it from [facility] and give it a try. If she has difficulty obtaining them, she will call back to the clinic for a prescription.

## 2010-12-07 NOTE — Progress Notes (Signed)
  Subjective:    Patient ID: April Mathis, female    DOB: 08-May-1950, 60 y.o.   MRN: [id number]  HPI  Patient is a 60 year old female with past medical history as noted in the chart.  She is a patient of mine. Patient was prediabetic and we had been working together on her weight loss. She has lost about 20 pounds and now her HbA1c has come down from 6.5 to 5.6. I congratulated her on the weight loss. The patient's blood pressure is 130/78. She is currently taking only lisinopril. Although patient was on hydrochlorothiazide but since developing incontinence she has not been using it.  Patient is complaining of swelling in her left leg which has been present since lost 10-15 years now. She is concerned that the veins in the back of her leg is causing the swelling and bothering her.    Review of Systems  Constitutional: Negative for fever, activity change and appetite change.  HENT: Negative for sore throat.   Respiratory: Negative for cough and shortness of breath.   Cardiovascular: Positive for leg swelling. Negative for chest pain.  Gastrointestinal: Negative for nausea, abdominal pain, diarrhea, constipation and abdominal distention.  Genitourinary: Negative for frequency, hematuria and difficulty urinating.  Neurological: Negative for dizziness and headaches.  Psychiatric/Behavioral: Negative for suicidal ideas and behavioral problems.       Objective:   Physical Exam  Constitutional: She is oriented to person, place, and time. She appears well-developed and well-nourished.  HENT:  Head: Normocephalic and atraumatic.  Eyes: Conjunctivae and EOM are normal. Pupils are equal, round, and reactive to light. No scleral icterus.  Neck: Normal range of motion. Neck supple. No JVD present. No thyromegaly present.  Cardiovascular: Normal rate, regular rhythm, normal heart sounds and intact distal pulses.  Exam reveals no gallop and no friction rub.   No murmur heard. Pulmonary/Chest:  Effort normal and breath sounds normal. No respiratory distress. She has no wheezes. She has no rales.  Abdominal: Soft. Bowel sounds are normal. She exhibits no distension and no mass. There is no tenderness. There is no rebound and no guarding.  Musculoskeletal: Normal range of motion. She exhibits edema (Patient has nonpitting edema in her right leg which is chronic). She exhibits no tenderness.  Lymphadenopathy:    She has no cervical adenopathy.  Neurological: She is alert and oriented to person, place, and time.  Psychiatric: She has a normal mood and affect. Her behavior is normal.          Assessment & Plan:

## 2010-12-07 NOTE — Assessment & Plan Note (Signed)
Patient was started on Chantix today. The side effects of Chantix including hallucinations and suicidal ideations were explained in detail.

## 2010-12-07 NOTE — Assessment & Plan Note (Signed)
Excellent control with weight loss as HbA1c coming down to 5.6. Recheck HbA1c in January.

## 2010-12-14 ENCOUNTER — Other Ambulatory Visit: Payer: Self-pay | Admitting: Dietician

## 2010-12-14 ENCOUNTER — Other Ambulatory Visit: Payer: Self-pay | Admitting: Internal Medicine

## 2010-12-14 DIAGNOSIS — E669 Obesity, unspecified: Secondary | ICD-10-CM

## 2010-12-27 ENCOUNTER — Ambulatory Visit: Payer: Medicare Other | Admitting: Dietician

## 2011-01-19 NOTE — Progress Notes (Signed)
Addended by: Neomia Dear on: 01/19/2011 03:06 PM   Modules accepted: Orders

## 2011-02-21 ENCOUNTER — Encounter: Payer: Medicare Other | Admitting: Internal Medicine

## 2011-02-24 NOTE — Progress Notes (Signed)
Addended by: Remus Blake on: 02/24/2011 04:16 PM   Modules accepted: Orders

## 2011-03-18 ENCOUNTER — Encounter: Payer: Medicare Other | Admitting: Internal Medicine

## 2011-05-17 ENCOUNTER — Encounter: Payer: Medicare Other | Admitting: Vascular Surgery

## 2011-05-23 ENCOUNTER — Telehealth: Payer: Self-pay | Admitting: *Deleted

## 2011-05-23 NOTE — Telephone Encounter (Signed)
PATIENT CALLED, REQUESTING REFERRAL TO VASCULAR AND VEIN/ SAYS HER ORTHO DR(DUDA) WANTS HER TO BE SEEN BY VASCULAR. PATIENT HAS APPT WITH ORTHO DR Monday. INSTRUCTED PATIENT TO LET HER DR KNOW THAT HE NEEDS TO SEND TO [facility] DR. THE OFFICE NOTE AND TO REQUEST THE REFERRAL TO VASCULAR AND VEIN WITH DX. SHE HAS APPT WITH [facility] IN June. PATIENT HAS MEDICAID AND MEDICARE.  April Mathis NT   [id number]  10:44AM

## 2011-05-31 ENCOUNTER — Inpatient Hospital Stay (HOSPITAL_COMMUNITY)
Admission: AD | Admit: 2011-05-31 | Discharge: 2011-05-31 | Disposition: A | Discharge status: Home / Self Care | Payer: Medicare Other | Source: Ambulatory Visit | Attending: Obstetrics | Admitting: Obstetrics

## 2011-05-31 ENCOUNTER — Encounter (HOSPITAL_COMMUNITY): Payer: Self-pay | Admitting: *Deleted

## 2011-05-31 DIAGNOSIS — L988 Other specified disorders of the skin and subcutaneous tissue: Secondary | ICD-10-CM

## 2011-05-31 DIAGNOSIS — R238 Other skin changes: Secondary | ICD-10-CM

## 2011-05-31 DIAGNOSIS — N949 Unspecified condition associated with female genital organs and menstrual cycle: Secondary | ICD-10-CM | POA: Insufficient documentation

## 2011-05-31 DIAGNOSIS — N390 Urinary tract infection, site not specified: Secondary | ICD-10-CM | POA: Insufficient documentation

## 2011-05-31 HISTORY — DX: Asymptomatic varicose veins of unspecified lower extremity: I83.90

## 2011-05-31 LAB — URINE MICROSCOPIC-ADD ON

## 2011-05-31 LAB — WET PREP, GENITAL: Yeast Wet Prep HPF POC: NONE SEEN

## 2011-05-31 LAB — URINALYSIS, ROUTINE W REFLEX MICROSCOPIC
Glucose, UA: NEGATIVE mg/dL
Protein, ur: NEGATIVE mg/dL

## 2011-05-31 MED ORDER — SULFAMETHOXAZOLE-TRIMETHOPRIM 800-160 MG PO TABS: 1.0000 | ORAL_TABLET | Freq: Two times a day (BID) | ORAL | Status: AC

## 2011-05-31 NOTE — MAU Note (Signed)
Pt in c/o vaginal discharge greenish in color with odor.  Reports pain with urination.  Last intercourse was last Thursday.

## 2011-05-31 NOTE — MAU Provider Note (Signed)
History     CSN: [id number]  Arrival date & time 05/31/11  0946   None     Chief Complaint  Patient presents with  . Vaginal Discharge  . Vaginal Pain    HPI April Mathis is a 61 y.o. female who presents to MAU for vaginal discharge and UTI symptoms. Onset of symptoms 4 days ago. Last sexual intercourse 5 days ago. Last pap smear one year ago and was normal. Current sex partner 2 years. No history of STI's. The history was provided by the patient.  Past Medical History  Diagnosis Date  . Hypertension   . Bilateral leg edema   . Obesity   . Smoking   . Knee pain   . Left knee DJD 2002    MRI done in 2002  . History of recurrent UTIs   . Varicose vein     Past Surgical History  Procedure Date  . Knee arthroscopy   . Tubal ligation     Family History  Problem Relation Age of Onset  . Anesthesia problems Neg Hx     History  Substance Use Topics  . Smoking status: Current Some Day Smoker -- 0.5 packs/day    Types: Cigarettes  . Smokeless tobacco: Not on file  . Alcohol Use: No    OB History    Grav Para Term Preterm Abortions TAB SAB Ect Mult Living   6 4 4  2 1 1   4       Review of Systems  Constitutional: Negative for fever, chills, diaphoresis and fatigue.  HENT: Negative for ear pain, congestion, sore throat, facial swelling, neck pain, neck stiffness, dental problem and sinus pressure.   Eyes: Positive for discharge and itching (using eye drops from opthomologist ). Negative for photophobia, pain and visual disturbance.  Respiratory: Positive for cough. Negative for chest tightness and wheezing.   Cardiovascular: Negative for chest pain and palpitations.  Gastrointestinal: Positive for abdominal pain. Negative for nausea, vomiting, diarrhea, constipation and abdominal distention.  Genitourinary: Positive for dysuria, frequency, vaginal discharge and pelvic pain. Negative for flank pain, vaginal bleeding, difficulty urinating and dyspareunia.    Musculoskeletal: Negative for myalgias, back pain and gait problem.  Skin: Negative for color change and rash.  Neurological: Positive for headaches. Negative for dizziness, speech difficulty, weakness, light-headedness and numbness.  Psychiatric/Behavioral: Negative for confusion and agitation. The patient is not nervous/anxious.     Allergies  Penicillins  Home Medications  No current outpatient prescriptions on file.  BP 153/78  Pulse 90  Temp(Src) 97.2 F (36.2 C) (Oral)  Resp 16  Ht 5' 1.5" (1.562 m)  Wt 195 lb (88.451 kg)  BMI 36.25 kg/m2  SpO2 97%  Physical Exam  Nursing note and vitals reviewed. Constitutional: She is oriented to person, place, and time. She appears well-developed and well-nourished.  HENT:  Head: Normocephalic.  Eyes: EOM are normal.  Neck: Neck supple.  Cardiovascular: Normal rate.   Pulmonary/Chest: Effort normal.  Abdominal: Soft. There is no tenderness.  Genitourinary:       External genitalia with multiple lesion noted on mucous membrane area of the labia minor. Tender on exam. White discharge vaginal vault. No cervical motion tenderness, no adnexal tenderness. Uterus without palpable enlargement.   Musculoskeletal: Normal range of motion.  Neurological: She is alert and oriented to person, place, and time. No cranial nerve deficit.  Skin: Skin is warm and dry.  Psychiatric: She has a normal mood and affect. Her behavior is  normal. Judgment and thought content normal.   Results for orders placed during the hospital encounter of 05/31/11 (from the past 24 hour(s))  URINALYSIS, ROUTINE W REFLEX MICROSCOPIC     Status: Abnormal   Collection Time   05/31/11 10:05 AM      Component Value Range   Color, Urine YELLOW  YELLOW    APPearance HAZY (*) CLEAR    Specific Gravity, Urine 1.010  1.005 - 1.030    pH 6.0  5.0 - 8.0    Glucose, UA NEGATIVE  NEGATIVE (mg/dL)   Hgb urine dipstick TRACE (*) NEGATIVE    Bilirubin Urine NEGATIVE  NEGATIVE     Ketones, ur NEGATIVE  NEGATIVE (mg/dL)   Protein, ur NEGATIVE  NEGATIVE (mg/dL)   Urobilinogen, UA 0.2  0.0 - 1.0 (mg/dL)   Nitrite POSITIVE (*) NEGATIVE    Leukocytes, UA SMALL (*) NEGATIVE   URINE MICROSCOPIC-ADD ON     Status: Abnormal   Collection Time   05/31/11 10:05 AM      Component Value Range   Squamous Epithelial / LPF RARE  RARE    WBC, UA 11-20  <3 (WBC/hpf)   RBC / HPF 3-6  <3 (RBC/hpf)   Bacteria, UA MANY (*) RARE   WET PREP, GENITAL     Status: Abnormal   Collection Time   05/31/11 12:05 PM      Component Value Range   Yeast Wet Prep HPF POC NONE SEEN  NONE SEEN    Trich, Wet Prep NONE SEEN  NONE SEEN    Clue Cells Wet Prep HPF POC NONE SEEN  NONE SEEN    WBC, Wet Prep HPF POC MODERATE (*) NONE SEEN    Assessment: Vaginal lesions   UTI  Plan:  Cultures sent for GC, Chlamydia and HSV   Rx Septra DS   Follow up with Dr. [name]   Return here as needed. I discussed in detail with patient clinical and lab findings. Patient voices understanding. Patient states that she had similar vaginal lesions last year and was tested for HSV and results were negative. Does not want medication before results back.   ED Course  Procedures  MDM

## 2011-05-31 NOTE — MAU Note (Signed)
Patient states she has had a vaginal discharge with a slight odor since last week. Has had pain with urinating and had a little pain and spotting after intercourse.

## 2011-05-31 NOTE — Discharge Instructions (Signed)
Urinary Tract Infection Infections of the urinary tract can start in several places. A bladder infection (cystitis), a kidney infection (pyelonephritis), and a prostate infection (prostatitis) are different types of urinary tract infections (UTIs). They usually get better if treated with medicines (antibiotics) that kill germs. Take all the medicine until it is gone. You or your child may feel better in a few days, but TAKE ALL MEDICINE or the infection may not respond and may become more difficult to treat. HOME CARE INSTRUCTIONS   Drink enough water and fluids to keep the urine clear or pale yellow. Cranberry juice is especially recommended, in addition to large amounts of water.   Avoid caffeine, tea, and carbonated beverages. They tend to irritate the bladder.   Alcohol may irritate the prostate.   Only take over-the-counter or prescription medicines for pain, discomfort, or fever as directed by your caregiver.  To prevent further infections:  Empty the bladder often. Avoid holding urine for long periods of time.   After a bowel movement, women should cleanse from front to back. Use each tissue only once.   Empty the bladder before and after sexual intercourse.  FINDING OUT THE RESULTS OF YOUR TEST Not all test results are available during your visit. If your or your child's test results are not back during the visit, make an appointment with your caregiver to find out the results. Do not assume everything is normal if you have not heard from your caregiver or the medical facility. It is important for you to follow up on all test results. SEEK MEDICAL CARE IF:   There is back pain.   Your baby is older than 3 months with a rectal temperature of 100.5 F (38.1 C) or higher for more than 1 day.   Your or your child's problems (symptoms) are no better in 3 days. Return sooner if you or your child is getting worse.  SEEK IMMEDIATE MEDICAL CARE IF:   There is severe back pain or lower  abdominal pain.   You or your child develops chills.   You have a fever.   Your baby is older than 3 months with a rectal temperature of 102 F (38.9 C) or higher.   Your baby is 3 months old or younger with a rectal temperature of 100.4 F (38 C) or higher.   There is nausea or vomiting.   There is continued burning or discomfort with urination.  MAKE SURE YOU:   Understand these instructions.   Will watch your condition.   Will get help right away if you are not doing well or get worse.  Document Released: 10/13/2004 Document Revised: 12/23/2010 Document Reviewed: 05/18/2006 Exit[employer] Patient Information 2012 [employer].Urinary Tract Infection Infections of the urinary tract can start in several places. A bladder infection (cystitis), a kidney infection (pyelonephritis), and a prostate infection (prostatitis) are different types of urinary tract infections (UTIs). They usually get better if treated with medicines (antibiotics) that kill germs. Take all the medicine until it is gone. You or your child may feel better in a few days, but TAKE ALL MEDICINE or the infection may not respond and may become more difficult to treat. HOME CARE INSTRUCTIONS   Drink enough water and fluids to keep the urine clear or pale yellow. Cranberry juice is especially recommended, in addition to large amounts of water.   Avoid caffeine, tea, and carbonated beverages. They tend to irritate the bladder.   Alcohol may irritate the prostate.   Only take   over-the-counter or prescription medicines for pain, discomfort, or fever as directed by your caregiver.  To prevent further infections:  Empty the bladder often. Avoid holding urine for long periods of time.   After a bowel movement, women should cleanse from front to back. Use each tissue only once.   Empty the bladder before and after sexual intercourse.  FINDING OUT THE RESULTS OF YOUR TEST Not all test results are available during your  visit. If your or your child's test results are not back during the visit, make an appointment with your caregiver to find out the results. Do not assume everything is normal if you have not heard from your caregiver or the medical facility. It is important for you to follow up on all test results. SEEK MEDICAL CARE IF:   There is back pain.   Your baby is older than 3 months with a rectal temperature of 100.5 F (38.1 C) or higher for more than 1 day.   Your or your child's problems (symptoms) are no better in 3 days. Return sooner if you or your child is getting worse.  SEEK IMMEDIATE MEDICAL CARE IF:   There is severe back pain or lower abdominal pain.   You or your child develops chills.   You have a fever.   Your baby is older than 3 months with a rectal temperature of 102 F (38.9 C) or higher.   Your baby is 3 months old or younger with a rectal temperature of 100.4 F (38 C) or higher.   There is nausea or vomiting.   There is continued burning or discomfort with urination.  MAKE SURE YOU:   Understand these instructions.   Will watch your condition.   Will get help right away if you are not doing well or get worse.  Document Released: 10/13/2004 Document Revised: 12/23/2010 Document Reviewed: 05/18/2006 [employer] Patient Information 2012 [employer]. 

## 2011-06-02 LAB — HERPES SIMPLEX VIRUS CULTURE
Culture: NOT DETECTED
Special Requests: NORMAL

## 2011-06-20 ENCOUNTER — Encounter: Payer: Self-pay | Admitting: Internal Medicine

## 2011-06-20 ENCOUNTER — Ambulatory Visit (INDEPENDENT_AMBULATORY_CARE_PROVIDER_SITE_OTHER): Payer: Medicare Other | Admitting: Internal Medicine

## 2011-06-20 VITALS — BP 133/74 | HR 73 | Temp 97.4°F | Wt 197.1 lb

## 2011-06-20 DIAGNOSIS — Z0000 Encounter for general adult medical examination without abnormal findings: Secondary | ICD-10-CM

## 2011-06-20 DIAGNOSIS — R7309 Other abnormal glucose: Secondary | ICD-10-CM

## 2011-06-20 DIAGNOSIS — Z1211 Encounter for screening for malignant neoplasm of colon: Secondary | ICD-10-CM

## 2011-06-20 DIAGNOSIS — F17200 Nicotine dependence, unspecified, uncomplicated: Secondary | ICD-10-CM

## 2011-06-20 DIAGNOSIS — I10 Essential (primary) hypertension: Secondary | ICD-10-CM

## 2011-06-20 DIAGNOSIS — E669 Obesity, unspecified: Secondary | ICD-10-CM

## 2011-06-20 LAB — BASIC METABOLIC PANEL WITH GFR
BUN: 12 mg/dL (ref 6–23)
CO2: 26 mEq/L (ref 19–32)
Calcium: 9.2 mg/dL (ref 8.4–10.5)
Chloride: 105 mEq/L (ref 96–112)
Creat: 0.56 mg/dL (ref 0.50–1.10)
GFR, Est African American: >89 mL/min
GFR, Est Non African American: >89 mL/min
Glucose, Bld: 112 mg/dL — ABNORMAL HIGH (ref 70–99)
Potassium: 4.6 mEq/L (ref 3.5–5.3)
Sodium: 139 mEq/L (ref 135–145)

## 2011-06-20 LAB — POCT GLYCOSYLATED HEMOGLOBIN (HGB A1C): Hemoglobin A1C: 5.8

## 2011-06-20 MED ORDER — LISINOPRIL 10 MG PO TABS: 10.0000 mg | ORAL_TABLET | Freq: Every day | ORAL | Status: DC

## 2011-06-20 NOTE — Patient Instructions (Signed)
Smoking Cessation This document explains the best ways for you to quit smoking and new treatments to help. It lists new medicines that can double or triple your chances of quitting and quitting for good. It also considers ways to avoid relapses and concerns you may have about quitting, including weight gain. NICOTINE: A POWERFUL ADDICTION If you have tried to quit smoking, you know how hard it can be. It is hard because nicotine is a very addictive drug. For some people, it can be as addictive as heroin or cocaine. Usually, people make 2 or 3 tries, or more, before finally being able to quit. Each time you try to quit, you can learn about what helps and what hurts. Quitting takes hard work and a lot of effort, but you can quit smoking. QUITTING SMOKING IS ONE OF THE MOST IMPORTANT THINGS YOU WILL EVER DO.  You will live longer, feel better, and live better.   The impact on your body of quitting smoking is felt almost immediately:   Within 20 minutes, blood pressure decreases. Pulse returns to its normal level.   After 8 hours, carbon monoxide levels in the blood return to normal. Oxygen level increases.   After 24 hours, chance of heart attack starts to decrease. Breath, hair, and body stop smelling like smoke.   After 48 hours, damaged nerve endings begin to recover. Sense of taste and smell improve.   After 72 hours, the body is virtually free of nicotine. Bronchial tubes relax and breathing becomes easier.   After 2 to 12 weeks, lungs can hold more air. Exercise becomes easier and circulation improves.   Quitting will reduce your risk of having a heart attack, stroke, cancer, or lung disease:   After 1 year, the risk of coronary heart disease is cut in half.   After 5 years, the risk of stroke falls to the same as a nonsmoker.   After 10 years, the risk of lung cancer is cut in half and the risk of other cancers decreases significantly.   After 15 years, the risk of coronary heart  disease drops, usually to the level of a nonsmoker.   If you are pregnant, quitting smoking will improve your chances of having a healthy baby.   The people you live with, especially your children, will be healthier.   You will have extra money to spend on things other than cigarettes.  FIVE KEYS TO QUITTING Studies have shown that these 5 steps will help you quit smoking and quit for good. You have the best chances of quitting if you use them together: 1. Get ready.  2. Get support and encouragement.  3. Learn new skills and behaviors.  4. Get medicine to reduce your nicotine addiction and use it correctly.  5. Be prepared for relapse or difficult situations. Be determined to continue trying to quit, even if you do not succeed at first.  1. GET READY  Set a quit date.   Change your environment.   Get rid of ALL cigarettes, ashtrays, matches, and lighters in your home, car, and place of work.   Do not let people smoke in your home.   Review your past attempts to quit. Think about what worked and what did not.   Once you quit, do not smoke. NOT EVEN A PUFF!  2. GET SUPPORT AND ENCOURAGEMENT Studies have shown that you have a better chance of being successful if you have help. You can get support in many ways.  Tell   your family, friends, and coworkers that you are going to quit and need their support. Ask them not to smoke around you.   Talk to your caregivers (doctor, dentist, nurse, pharmacist, psychologist, and/or smoking counselor).   Get individual, group, or telephone counseling and support. The more counseling you have, the better your chances are of quitting. Programs are available at local hospitals and health centers. Call your local health department for information about programs in your area.   Spiritual beliefs and practices may help some smokers quit.   Quit meters are small computer programs online or downloadable that keep track of quit statistics, such as amount  of "quit-time," cigarettes not smoked, and money saved.   Many smokers find one or more of the many self-help books available useful in helping them quit and stay off tobacco.  3. LEARN NEW SKILLS AND BEHAVIORS  Try to distract yourself from urges to smoke. Talk to someone, go for a walk, or occupy your time with a task.   When you first try to quit, change your routine. Take a different route to work. Drink tea instead of coffee. Eat breakfast in a different place.   Do something to reduce your stress. Take a hot bath, exercise, or read a book.   Plan something enjoyable to do every day. Reward yourself for not smoking.   Explore interactive web-based programs that specialize in helping you quit.  4. GET MEDICINE AND USE IT CORRECTLY Medicines can help you stop smoking and decrease the urge to smoke. Combining medicine with the above behavioral methods and support can quadruple your chances of successfully quitting smoking. The [place] Food and Drug Administration (FDA) has approved 7 medicines to help you quit smoking. These medicines fall into 3 categories.  Nicotine replacement therapy (delivers nicotine to your body without the negative effects and risks of smoking):   Nicotine gum: Available over-the-counter.   Nicotine lozenges: Available over-the-counter.   Nicotine inhaler: Available by prescription.   Nicotine nasal spray: Available by prescription.   Nicotine skin patches (transdermal): Available by prescription and over-the-counter.   Antidepressant medicine (helps people abstain from smoking, but how this works is unknown):   Bupropion sustained-release (SR) tablets: Available by prescription.   Nicotinic receptor partial agonist (simulates the effect of nicotine in your brain):   Varenicline tartrate tablets: Available by prescription.   Ask your caregiver for advice about which medicines to use and how to use them. Carefully read the information on the package.    Everyone who is trying to quit may benefit from using a medicine. If you are pregnant or trying to become pregnant, nursing an infant, you are under age 18, or you smoke fewer than 10 cigarettes per day, talk to your caregiver before taking any nicotine replacement medicines.   You should stop using a nicotine replacement product and call your caregiver if you experience nausea, dizziness, weakness, vomiting, fast or irregular heartbeat, mouth problems with the lozenge or gum, or redness or swelling of the skin around the patch that does not go away.   Do not use any other product containing nicotine while using a nicotine replacement product.   Talk to your caregiver before using these products if you have diabetes, heart disease, asthma, stomach ulcers, you had a recent heart attack, you have high blood pressure that is not controlled with medicine, a history of irregular heartbeat, or you have been prescribed medicine to help you quit smoking.  5. BE PREPARED FOR RELAPSE OR   DIFFICULT SITUATIONS  Most relapses occur within the first 3 months after quitting. Do not be discouraged if you start smoking again. Remember, most people try several times before they finally quit.   You may have symptoms of withdrawal because your body is used to nicotine. You may crave cigarettes, be irritable, feel very hungry, cough often, get headaches, or have difficulty concentrating.   The withdrawal symptoms are only temporary. They are strongest when you first quit, but they will go away within 10 to 14 days.  Here are some difficult situations to watch for:  Alcohol. Avoid drinking alcohol. Drinking lowers your chances of successfully quitting.   Caffeine. Try to reduce the amount of caffeine you consume. It also lowers your chances of successfully quitting.   Other smokers. Being around smoking can make you want to smoke. Avoid smokers.   Weight gain. Many smokers will gain weight when they quit, usually  less than 10 pounds. Eat a healthy diet and stay active. Do not let weight gain distract you from your main goal, quitting smoking. Some medicines that help you quit smoking may also help delay weight gain. You can always lose the weight gained after you quit.   Bad mood or depression. There are a lot of ways to improve your mood other than smoking.  If you are having problems with any of these situations, talk to your caregiver. SPECIAL SITUATIONS AND CONDITIONS Studies suggest that everyone can quit smoking. Your situation or condition can give you a special reason to quit.  Pregnant women/new mothers: By quitting, you protect your baby's health and your own.   Hospitalized patients: By quitting, you reduce health problems and help healing.   Heart attack patients: By quitting, you reduce your risk of a second heart attack.   Lung, head, and neck cancer patients: By quitting, you reduce your chance of a second cancer.   Parents of children and adolescents: By quitting, you protect your children from illnesses caused by secondhand smoke.  QUESTIONS TO THINK ABOUT Think about the following questions before you try to stop smoking. You may want to talk about your answers with your caregiver.  Why do you want to quit?   If you tried to quit in the past, what helped and what did not?   What will be the most difficult situations for you after you quit? How will you plan to handle them?   Who can help you through the tough times? Your family? Friends? Caregiver?   What pleasures do you get from smoking? What ways can you still get pleasure if you quit?  Here are some questions to ask your caregiver:  How can you help me to be successful at quitting?   What medicine do you think would be best for me and how should I take it?   What should I do if I need more help?   What is smoking withdrawal like? How can I get information on withdrawal?  Quitting takes hard work and a lot of effort,  but you can quit smoking. FOR MORE INFORMATION  Smokefree.gov (http://www.smokefree.gov) provides free, accurate, evidence-based information and professional assistance to help support the immediate and long-term needs of people trying to quit smoking. Document Released: 12/28/2000 Document Revised: 12/23/2010 Document Reviewed: 10/20/2008 [employer] Patient Information 2012 [employer]. 

## 2011-06-20 NOTE — Assessment & Plan Note (Signed)
No ready to quit.  

## 2011-06-20 NOTE — Assessment & Plan Note (Signed)
Continues to lose weight

## 2011-06-20 NOTE — Assessment & Plan Note (Addendum)
Well controlled. Check BMP for K and crt. Counseled for weight loss and smoking cessation.

## 2011-06-20 NOTE — Assessment & Plan Note (Signed)
Refuses pap smear in our clinic. Colonoscopy referral today. Refused Zostavax.

## 2011-06-20 NOTE — Progress Notes (Signed)
Addended by: Maura Crandall on: 06/20/2011 02:02 PM   Modules accepted: Orders

## 2011-06-20 NOTE — Progress Notes (Signed)
  Subjective:    Patient ID: April Mathis, female    DOB: November 11, 1950, 61 y.o.   MRN: [id number]  HPI April Mathis is here today for a routine follow up.  She recently finished a course of antibiotics for urinary tract infection. She is still smoking 1 pack per day and not ready to cut at this time.  Patient was prediabetic but her hba1c is down to 5.8 with weight loss and diet control.  She is due for PAP but wants to get it done with her OBGyn, April Dross, MD.  She is due for colonoscopy.  Refused the vaccine for shingles.  She has lost about 20 lbs in last 1 year and congratulated her on that.   Review of Systems  Constitutional: Negative for fever, activity change and appetite change.  HENT: Negative for sore throat.   Respiratory: Negative for cough and shortness of breath.   Cardiovascular: Negative for chest pain and leg swelling.  Gastrointestinal: Negative for nausea, abdominal pain, diarrhea, constipation and abdominal distention.  Genitourinary: Negative for frequency, hematuria and difficulty urinating.  Neurological: Negative for dizziness and headaches.  Psychiatric/Behavioral: Negative for suicidal ideas and behavioral problems.       Objective:   Physical Exam  Constitutional: She is oriented to person, place, and time. She appears well-developed and well-nourished.  HENT:  Head: Normocephalic and atraumatic.  Eyes: Conjunctivae and EOM are normal. Pupils are equal, round, and reactive to light. No scleral icterus.  Neck: Normal range of motion. Neck supple. No JVD present. No thyromegaly present.  Cardiovascular: Normal rate, regular rhythm, normal heart sounds and intact distal pulses.  Exam reveals no gallop and no friction rub.   No murmur heard. Pulmonary/Chest: Effort normal and breath sounds normal. No respiratory distress. She has no wheezes. She has no rales.  Abdominal: Soft. Bowel sounds are normal. She exhibits no distension and no mass. There  is no tenderness. There is no rebound and no guarding.  Musculoskeletal: Normal range of motion. She exhibits no edema and no tenderness.  Lymphadenopathy:    She has no cervical adenopathy.  Neurological: She is alert and oriented to person, place, and time.  Psychiatric: She has a normal mood and affect. Her behavior is normal.          Assessment & Plan:

## 2011-06-20 NOTE — Assessment & Plan Note (Signed)
Patient's hba1c is well controlled with weight loss.  Hba1c 5.8 today.

## 2011-08-02 ENCOUNTER — Encounter: Payer: Medicare Other | Admitting: Vascular Surgery

## 2011-08-09 ENCOUNTER — Encounter: Payer: Medicare Other | Admitting: Vascular Surgery

## 2011-08-09 NOTE — Addendum Note (Signed)
Addended by: Neomia Dear on: 08/09/2011 05:10 AM   Modules accepted: Orders

## 2011-09-26 ENCOUNTER — Other Ambulatory Visit: Payer: Self-pay

## 2011-09-26 DIAGNOSIS — I83893 Varicose veins of bilateral lower extremities with other complications: Secondary | ICD-10-CM

## 2011-09-30 ENCOUNTER — Encounter: Payer: Self-pay | Admitting: Vascular Surgery

## 2011-10-03 ENCOUNTER — Ambulatory Visit (INDEPENDENT_AMBULATORY_CARE_PROVIDER_SITE_OTHER): Payer: Medicare Other | Admitting: Vascular Surgery

## 2011-10-03 ENCOUNTER — Encounter: Payer: Self-pay | Admitting: Vascular Surgery

## 2011-10-03 ENCOUNTER — Encounter (INDEPENDENT_AMBULATORY_CARE_PROVIDER_SITE_OTHER): Payer: Medicare Other | Admitting: *Deleted

## 2011-10-03 VITALS — BP 172/88 | HR 74 | Resp 18 | Ht 62.0 in | Wt 187.0 lb

## 2011-10-03 DIAGNOSIS — R609 Edema, unspecified: Secondary | ICD-10-CM

## 2011-10-03 DIAGNOSIS — I83893 Varicose veins of bilateral lower extremities with other complications: Secondary | ICD-10-CM

## 2011-10-03 NOTE — Progress Notes (Signed)
Subjective:     Patient ID: April Mathis, female   DOB: [birth year], 61 y.o.   MRN: [id number]  HPI this 61 year old female was referred by Dr. Berna Spare DUDA for evaluation of swelling in the left lower extremity and bulging varicosities. The patient has left knee problems and may need total knee replacement. She has been having progressive edema and bulging varicose veins in the left is full thigh and calf particularly posteriorly over the past few years. This causes aching throbbing and burning discomfort as the day progresses. She has progressive edema in the calf and ankle area as the day progresses. She has no history of DVT or thrombophlebitis. She does not wear elastic compression stockings nor elevate her legs nor take pain medicine.  Past Medical History  Diagnosis Date  . Hypertension   . Bilateral leg edema   . Obesity   . Smoking   . Knee pain   . Left knee DJD 2002    MRI done in 2002  . History of recurrent UTIs   . Varicose vein     History  Substance Use Topics  . Smoking status: Current Every Day Smoker -- 0.5 packs/day for 20 years    Types: Cigarettes  . Smokeless tobacco: Never Used  . Alcohol Use: No    Family History  Problem Relation Age of Onset  . Anesthesia problems Neg Hx   . Cancer Mother     LUNG    Allergies  Allergen Reactions  . Penicillins Hives    Current outpatient prescriptions:lisinopril (PRINIVIL,ZESTRIL) 10 MG tablet, Take 1 tablet (10 mg total) by mouth daily., Disp: 90 tablet, Rfl: 3;  tobramycin-dexamethasone (TOBRADEX) ophthalmic ointment, Place 1 application into both eyes daily., Disp: , Rfl:   BP 172/88  Pulse 74  Resp 18  Ht 5\' 2"  (1.575 m)  Wt 187 lb (84.823 kg)  BMI 34.20 kg/m2  Body mass index is 34.20 kg/(m^2).           Review of Systems complains of swelling in legs, varicose veins, pain in legs, denies chest pain, dyspnea on exertion, PND, orthopnea, chronic bronchitis. Does have pain in left knee. Other  systems negative and complete review of systems    Objective:   Physical Exam blood pressure 172/88 heart rate 74 respirations 18 Gen.-alert and oriented x3 in no apparent distress-obese HEENT normal for age Lungs no rhonchi or wheezing Cardiovascular regular rhythm no murmurs carotid pulses 3+ palpable no bruits audible Abdomen soft nontender no palpable masses Musculoskeletal free of  major deformities Skin clear -no rashes Neurologic normal Lower extremities 3+ femoral and dorsalis pedis pulses palpable bilaterally with 1-2+ edema left leg beginning at calf extending in the ankle and foot right leg with no edema. Bulging varicosities left leg and posterior calf and extending medially up to the knee. There is no hyperpigmentation no active ulceration. There are multiple prominent reticular and spider veins in the left ankle particularly near the medial malleolus.  Today I ordered bilateral lower extremity venous duplex exam which I reviewed and interpreted. There is no DVT. There is gross reflux in the left small saphenous vein supplying these bulging varicosities. Right leg has some mild reflux in the great saphenous vein but this caliber of the vein is small.      Assessment:     Bulging varicosities left leg secondary to severe reflux left small saphenous vein-causing pain and swelling-affecting her daily living    Plan:     #1 long-leg  elastic compression stockings 20-30 mm gradient #2 elevate legs during the day as much as possible #3 continue weight loss program #4 ibuprofen on a daily basis #5 return to see me in 3 months if no significant improvement she should have laser ablation left small saphenous vein and then return in 3 months to see if stab phlebectomy of secondary varicosities is indicated

## 2011-11-25 ENCOUNTER — Encounter (HOSPITAL_COMMUNITY): Payer: Self-pay | Admitting: *Deleted

## 2011-11-25 ENCOUNTER — Inpatient Hospital Stay (HOSPITAL_COMMUNITY)
Admission: AD | Admit: 2011-11-25 | Discharge: 2011-11-25 | Disposition: A | Discharge status: Home / Self Care | Payer: Medicare Other | Source: Ambulatory Visit | Attending: Obstetrics | Admitting: Obstetrics

## 2011-11-25 DIAGNOSIS — N950 Postmenopausal bleeding: Secondary | ICD-10-CM | POA: Insufficient documentation

## 2011-11-25 DIAGNOSIS — N949 Unspecified condition associated with female genital organs and menstrual cycle: Secondary | ICD-10-CM | POA: Insufficient documentation

## 2011-11-25 DIAGNOSIS — N898 Other specified noninflammatory disorders of vagina: Secondary | ICD-10-CM

## 2011-11-25 DIAGNOSIS — N72 Inflammatory disease of cervix uteri: Secondary | ICD-10-CM | POA: Insufficient documentation

## 2011-11-25 DIAGNOSIS — Z202 Contact with and (suspected) exposure to infections with a predominantly sexual mode of transmission: Secondary | ICD-10-CM | POA: Insufficient documentation

## 2011-11-25 LAB — URINALYSIS, ROUTINE W REFLEX MICROSCOPIC
Bilirubin Urine: NEGATIVE
Glucose, UA: NEGATIVE mg/dL
Hgb urine dipstick: NEGATIVE
Specific Gravity, Urine: 1.01 (ref 1.005–1.030)
Urobilinogen, UA: 0.2 mg/dL (ref 0.0–1.0)
pH: 7 (ref 5.0–8.0)

## 2011-11-25 LAB — WET PREP, GENITAL: Clue Cells Wet Prep HPF POC: NONE SEEN

## 2011-11-25 MED ORDER — AZITHROMYCIN 1 G PO PACK
1.0000 g | PACK | Freq: Once | ORAL | Status: AC
  Administered 2011-11-25: 1 g via ORAL
  Filled 2011-11-25: qty 1

## 2011-11-25 MED ORDER — CEFTRIAXONE SODIUM 250 MG IJ SOLR
250.0000 mg | Freq: Once | INTRAMUSCULAR | Status: AC
  Administered 2011-11-25: 250 mg via INTRAMUSCULAR
  Filled 2011-11-25: qty 250

## 2011-11-25 NOTE — MAU Note (Signed)
Patient is not in the lobby when called to triage.  

## 2011-11-25 NOTE — MAU Provider Note (Signed)
History     CSN: [id number]  Arrival date and time: 11/25/11 0855   None     Chief Complaint  Patient presents with  . Vaginal Discharge   HPI April Mathis is a 61 y.o. female who presents to MAU with vaginal discharge. The discharge started over a week ago. She describes the discharge as yellow.  Associated symptoms include vaginal bleeding. She states that she had GC in the past and wants to be checked. Her sex partner also has yellow discharge and burning of the urethra. The history was provided by the patient.  OB History    Grav Para Term Preterm Abortions TAB SAB Ect Mult Living   6 4 4  2 1 1   4       Past Medical History  Diagnosis Date  . Hypertension   . Bilateral leg edema   . Obesity   . Smoking   . Knee pain   . Left knee DJD 2002    MRI done in 2002  . History of recurrent UTIs   . Varicose vein     Past Surgical History  Procedure Date  . Knee arthroscopy   . Tubal ligation     Family History  Problem Relation Age of Onset  . Anesthesia problems Neg Hx   . Cancer Mother     LUNG    History  Substance Use Topics  . Smoking status: Current Every Day Smoker -- 0.5 packs/day for 20 years    Types: Cigarettes  . Smokeless tobacco: Never Used  . Alcohol Use: No    Allergies:  Allergies  Allergen Reactions  . Penicillins Hives    Prescriptions prior to admission  Medication Sig Dispense Refill  . lisinopril (PRINIVIL,ZESTRIL) 10 MG tablet Take 1 tablet (10 mg total) by mouth daily.  90 tablet  3    Review of Systems  Constitutional: Negative for fever, chills and weight loss.  HENT: Negative for ear pain, nosebleeds, congestion, sore throat and neck pain.   Eyes: Negative for blurred vision, double vision, photophobia and pain.  Respiratory: Negative for cough, shortness of breath and wheezing.   Cardiovascular: Negative for chest pain, palpitations and leg swelling.  Gastrointestinal: Negative for heartburn, nausea, vomiting,  abdominal pain, diarrhea and constipation.  Genitourinary: Positive for frequency. Negative for dysuria and urgency.       Vaginal discharge and irritation, vaginal bleeding  Musculoskeletal: Negative for myalgias and back pain.  Skin: Negative for itching and rash.  Neurological: Negative for dizziness, sensory change, speech change, seizures, weakness and headaches.  Endo/Heme/Allergies: Does not bruise/bleed easily.  Psychiatric/Behavioral: Negative for depression. The patient is not nervous/anxious.    Physical Exam   Blood pressure 136/65, pulse 64, temperature 97.9 F (36.6 C), temperature source Oral, resp. rate 20, height 5\' 2"  (1.575 m), weight 193 lb (87.544 kg).  Physical Exam  Nursing note and vitals reviewed. Constitutional: She is oriented to person, place, and time. She appears well-developed and well-nourished. No distress.  HENT:  Head: Normocephalic and atraumatic.  Eyes: EOM are normal.  Neck: Neck supple.  Cardiovascular: Normal rate.   Respiratory: Effort normal.  GI: Soft. There is no tenderness.  Genitourinary:       External genitalia without lesions. Yellow discharge vaginal vault. Cervix with erythema. Scant blood noted. No CMT, no adnexal tenderness. Uterus without palpable enlargement.  Musculoskeletal: Normal range of motion.  Neurological: She is alert and oriented to person, place, and time.  Skin: Skin  is warm and dry.  Psychiatric: She has a normal mood and affect. Her behavior is normal. Judgment and thought content normal.   Results for orders placed during the hospital encounter of 11/25/11 (from the past 24 hour(s))  WET PREP, GENITAL     Status: Abnormal   Collection Time   11/25/11  9:50 AM      Component Value Range   Yeast Wet Prep HPF POC NONE SEEN  NONE SEEN   Trich, Wet Prep NONE SEEN  NONE SEEN   Clue Cells Wet Prep HPF POC NONE SEEN  NONE SEEN   WBC, Wet Prep HPF POC FEW (*) NONE SEEN  URINALYSIS, ROUTINE W REFLEX MICROSCOPIC      Status: Normal   Collection Time   11/25/11 10:00 AM      Component Value Range   Color, Urine YELLOW  YELLOW   APPearance CLEAR  CLEAR   Specific Gravity, Urine 1.010  1.005 - 1.030   pH 7.0  5.0 - 8.0   Glucose, UA NEGATIVE  NEGATIVE mg/dL   Hgb urine dipstick NEGATIVE  NEGATIVE   Bilirubin Urine NEGATIVE  NEGATIVE   Ketones, ur NEGATIVE  NEGATIVE mg/dL   Protein, ur NEGATIVE  NEGATIVE mg/dL   Urobilinogen, UA 0.2  0.0 - 1.0 mg/dL   Nitrite NEGATIVE  NEGATIVE   Leukocytes, UA NEGATIVE  NEGATIVE   Assessment: 61 y.o. female with vaginal discharge possible STI exposure   Cervicitis   Postmenopausal bleeding  Plan:  Rocephin 250 mg IM   Zithromax 1 gram PO   Follow up with Dr. [name] ASAP for further evaluation of bleeding Discussed with the patient and all questioned fully answered.   Medication List     As of 11/25/2011 11:18 AM    CONTINUE taking these medications         lisinopril 10 MG tablet   Commonly known as: PRINIVIL,ZESTRIL   Take 1 tablet (10 mg total) by mouth daily.         Procedures  Fedra Lanter, RN, FNP, [facility] 11/25/2011, 9:32 AM

## 2011-11-25 NOTE — MAU Note (Signed)
Pt reports having a brownish/yellow  Vaginal discharge for about 3-4 weeks. Reports having some vaginal bleeding as well. Pt is  Post menaposal.

## 2011-11-26 LAB — GC/CHLAMYDIA PROBE AMP, GENITAL
Chlamydia, DNA Probe: NEGATIVE
GC Probe Amp, Genital: NEGATIVE

## 2011-12-05 ENCOUNTER — Encounter: Payer: Self-pay | Admitting: Internal Medicine

## 2011-12-05 ENCOUNTER — Ambulatory Visit (INDEPENDENT_AMBULATORY_CARE_PROVIDER_SITE_OTHER): Payer: Medicare Other | Admitting: Internal Medicine

## 2011-12-05 VITALS — BP 141/83 | HR 79 | Wt 188.5 lb

## 2011-12-05 DIAGNOSIS — R7309 Other abnormal glucose: Secondary | ICD-10-CM

## 2011-12-05 DIAGNOSIS — Z0000 Encounter for general adult medical examination without abnormal findings: Secondary | ICD-10-CM

## 2011-12-05 DIAGNOSIS — I10 Essential (primary) hypertension: Secondary | ICD-10-CM

## 2011-12-05 DIAGNOSIS — N949 Unspecified condition associated with female genital organs and menstrual cycle: Secondary | ICD-10-CM

## 2011-12-05 DIAGNOSIS — N938 Other specified abnormal uterine and vaginal bleeding: Secondary | ICD-10-CM

## 2011-12-05 DIAGNOSIS — N950 Postmenopausal bleeding: Secondary | ICD-10-CM

## 2011-12-05 DIAGNOSIS — E669 Obesity, unspecified: Secondary | ICD-10-CM

## 2011-12-05 DIAGNOSIS — F17200 Nicotine dependence, unspecified, uncomplicated: Secondary | ICD-10-CM

## 2011-12-05 LAB — CBC
HCT: 38.5 % (ref 36.0–46.0)
Hemoglobin: 12.4 g/dL (ref 12.0–15.0)
MCV: 88.5 fL (ref 78.0–100.0)
RDW: 13.6 % (ref 11.5–15.5)
WBC: 6.1 10*3/uL (ref 4.0–10.5)

## 2011-12-05 LAB — LIPID PANEL
HDL: 43 mg/dL (ref >39)
LDL Cholesterol: 63 mg/dL (ref 0–99)
VLDL: 26 mg/dL (ref 0–40)

## 2011-12-05 LAB — POCT GLYCOSYLATED HEMOGLOBIN (HGB A1C): Hemoglobin A1C: 5.8

## 2011-12-05 NOTE — Assessment & Plan Note (Signed)
Patient has lost 35 pounds since starting counseling last year. Congratulated and discussed about new goals.

## 2011-12-05 NOTE — Assessment & Plan Note (Addendum)
Counseled

## 2011-12-05 NOTE — Progress Notes (Signed)
  Subjective:    Patient ID: April Mathis, female    DOB: Jun 11, 1950, 61 y.o.   MRN: [id number]  HPI  Ms. April Mathis is 61 year old with PMH as noted in the chart and here today for a regular follow up.  Her BP is very well controlled on lisinopril 10. She has lost 35 pounds in last 1 year and motivated to lose weight. i congratulated and counselled her about the benefits of losing more weight.  She is prediabetic with Hba1c 5.8 today.  She went to ER on 11/8 for vaginal discharge and bleeding. She requests an appointment with her Gyn physician today.  Patient refuses colonoscopy referral today and does not want hemmoccult cards.  Lipid profile ordered today.  Review of Systems  Constitutional: Negative for fever, activity change and appetite change.  HENT: Negative for sore throat.   Respiratory: Negative for cough and shortness of breath.   Cardiovascular: Negative for chest pain and leg swelling.  Gastrointestinal: Negative for nausea, abdominal pain, diarrhea, constipation and abdominal distention.  Genitourinary: Negative for frequency, hematuria and difficulty urinating.  Neurological: Negative for dizziness and headaches.  Psychiatric/Behavioral: Negative for suicidal ideas and behavioral problems.       Objective:   Physical Exam  Constitutional: She is oriented to person, place, and time. She appears well-developed and well-nourished.  HENT:  Head: Normocephalic and atraumatic.  Eyes: Conjunctivae normal and EOM are normal. Pupils are equal, round, and reactive to light. No scleral icterus.  Neck: Normal range of motion. Neck supple. No JVD present. No thyromegaly present.  Cardiovascular: Normal rate, regular rhythm, normal heart sounds and intact distal pulses.  Exam reveals no gallop and no friction rub.   No murmur heard. Pulmonary/Chest: Effort normal and breath sounds normal. No respiratory distress. She has no wheezes. She has no rales.  Abdominal: Soft. Bowel sounds  are normal. She exhibits no distension and no mass. There is no tenderness. There is no rebound and no guarding.  Musculoskeletal: Normal range of motion. She exhibits no edema and no tenderness.  Lymphadenopathy:    She has no cervical adenopathy.  Neurological: She is alert and oriented to person, place, and time.  Psychiatric: She has a normal mood and affect. Her behavior is normal.          Assessment & Plan:

## 2011-12-05 NOTE — Patient Instructions (Signed)

## 2011-12-05 NOTE — Assessment & Plan Note (Signed)
Patient will be referred to her Gyn to work this up further.  Differentials include uterine fibroids, endometriosis, endometrial cancer and cervical cancer. Physical exam(gyn exam on the day of ED visit did not reveal any obvious abnormalities. Unwilling for exam today and wants referral to be made. Follow up in 3 months or earlier as needed. Check CBC.

## 2011-12-05 NOTE — Assessment & Plan Note (Signed)
HBA1c is 5.8 today. Recheck in 3 months.

## 2011-12-05 NOTE — Assessment & Plan Note (Signed)
Well-controlled. Check BMP today. 

## 2011-12-05 NOTE — Assessment & Plan Note (Signed)
Lipid profile today. Refused colonoscopy. Pap smear at [facility] referral.

## 2011-12-06 LAB — BASIC METABOLIC PANEL WITH GFR
Calcium: 9.7 mg/dL (ref 8.4–10.5)
Glucose, Bld: 88 mg/dL (ref 70–99)
Potassium: 4.8 mEq/L (ref 3.5–5.3)
Sodium: 140 mEq/L (ref 135–145)

## 2011-12-07 NOTE — Addendum Note (Signed)
Addended by: Maura Crandall on: 12/07/2011 11:46 AM   Modules accepted: Orders

## 2012-01-02 ENCOUNTER — Encounter: Payer: Self-pay | Admitting: Vascular Surgery

## 2012-01-02 ENCOUNTER — Ambulatory Visit (INDEPENDENT_AMBULATORY_CARE_PROVIDER_SITE_OTHER): Payer: Medicare Other | Admitting: Vascular Surgery

## 2012-01-02 VITALS — BP 143/80 | HR 73 | Resp 16 | Ht 62.0 in | Wt 187.0 lb

## 2012-01-02 DIAGNOSIS — I83893 Varicose veins of bilateral lower extremities with other complications: Secondary | ICD-10-CM

## 2012-01-02 NOTE — Progress Notes (Signed)
Subjective:     Patient ID: April Mathis, female   DOB: Jun 17, 1950, 61 y.o.   MRN: [id number]  HPI this 61 year old female returns for further evaluation for venous insufficiency of the left leg. She has bulging varicosities in the calf and lateral thigh area as well as chronic edema. She has been found to have documented gross reflux in the left small saphenous vein causing these bulging varicosities. She has continued to have aching throbbing and burning discomfort. She did neglect however to by the elastic compression stockings. She has been elevating her legs and trying ibuprofen with no success. She has no history of DVT or thrombophlebitis. She needs a left knee replacement following her venous procedure and after losing some weight.  Past Medical History  Diagnosis Date  . Hypertension   . Bilateral leg edema   . Obesity   . Smoking   . Knee pain   . Left knee DJD 2002    MRI done in 2002  . History of recurrent UTIs   . Varicose vein     History  Substance Use Topics  . Smoking status: Current Every Day Smoker -- 0.5 packs/day for 20 years    Types: Cigarettes  . Smokeless tobacco: Never Used  . Alcohol Use: No    Family History  Problem Relation Age of Onset  . Anesthesia problems Neg Hx   . Cancer Mother     LUNG    Allergies  Allergen Reactions  . Penicillins Hives    Current outpatient prescriptions:lisinopril (PRINIVIL,ZESTRIL) 10 MG tablet, Take 1 tablet (10 mg total) by mouth daily., Disp: 90 tablet, Rfl: 3  BP 143/80  Pulse 73  Resp 16  Ht 5\' 2"  (1.575 m)  Wt 187 lb (84.823 kg)  BMI 34.20 kg/m2  Body mass index is 34.20 kg/(m^2).         Review of Systems denies chest pain or dyspnea on exertion     Objective:   Physical Exam blood pressure 143/80 heart rate 73 respirations 60 General well-developed well-nourished female in no apparent stress alert and oriented x3 Lungs no rhonchi or wheezing Left leg with bulging varicosities in the  lateral thigh and posterior calf and 1+ distal edema. No active ulceration is noted.     Assessment:     Venous insufficiency left leg with bulging varicosities secondary to small saphenous reflux-affecting patient's daily living    Plan:     Patient will purchase long-leg elastic compression stockings today 20-30 mm gradient and return in 3 months after trying medical management including stockings, elevation, and ibuprofen. If no significant improvement in her symptoms she should have laser ablation left small saphenous vein and to return in 3 months with possible stab phlebectomy of secondary varicosities. She will then need left knee replacement in the near future

## 2012-01-04 ENCOUNTER — Other Ambulatory Visit: Payer: Self-pay | Admitting: *Deleted

## 2012-02-21 ENCOUNTER — Telehealth: Payer: Self-pay | Admitting: *Deleted

## 2012-02-21 ENCOUNTER — Other Ambulatory Visit: Payer: Self-pay | Admitting: Internal Medicine

## 2012-02-21 DIAGNOSIS — Z124 Encounter for screening for malignant neoplasm of cervix: Secondary | ICD-10-CM

## 2012-02-21 NOTE — Telephone Encounter (Signed)
SPOKE WITH OFFICE/ PER OFFICE PATIENT WILL NEED REFERRAL FOR GYN "PROBLEMS" SHE IS TO SEE HER PCP FOR REGULAR GYN CARE ( PAP TEST/ DISCHARGE/ ITCH, ETC). OFFICE SAYS THEY HAVE SPOKEN WITH HER ABOUT THIS NUMEROUS OF TIMES, HAS SOMETHING TO DO WITH MEDICARE.  DR Eben Burow IS AWARE OF THIS.  April Mathis NTII 2-4-014  4:05PM

## 2012-02-21 NOTE — Addendum Note (Signed)
Addended by: Lars Mage on: 02/21/2012 04:00 PM   Modules accepted: Orders

## 2012-03-12 ENCOUNTER — Ambulatory Visit (INDEPENDENT_AMBULATORY_CARE_PROVIDER_SITE_OTHER): Payer: Medicare Other | Admitting: Internal Medicine

## 2012-03-12 ENCOUNTER — Encounter: Payer: Self-pay | Admitting: Internal Medicine

## 2012-03-12 VITALS — BP 134/72 | HR 65 | Temp 98.0°F | Ht 62.0 in | Wt 189.1 lb

## 2012-03-12 DIAGNOSIS — I10 Essential (primary) hypertension: Secondary | ICD-10-CM

## 2012-03-12 DIAGNOSIS — A64 Unspecified sexually transmitted disease: Secondary | ICD-10-CM

## 2012-03-12 DIAGNOSIS — Z0000 Encounter for general adult medical examination without abnormal findings: Secondary | ICD-10-CM

## 2012-03-12 DIAGNOSIS — Z7251 High risk heterosexual behavior: Secondary | ICD-10-CM

## 2012-03-12 DIAGNOSIS — R7309 Other abnormal glucose: Secondary | ICD-10-CM

## 2012-03-12 LAB — POCT GLYCOSYLATED HEMOGLOBIN (HGB A1C): Hemoglobin A1C: 5.6

## 2012-03-12 NOTE — Assessment & Plan Note (Signed)
HIV test was ordered as patient had a venereal disease and also has a partner who was tested positive for STD's.

## 2012-03-12 NOTE — Patient Instructions (Signed)

## 2012-03-12 NOTE — Assessment & Plan Note (Signed)
Well controlled 

## 2012-03-12 NOTE — Assessment & Plan Note (Signed)
Hba1c of 5.6 today. Continue to lose weight and exercise. No medications indicated.

## 2012-03-12 NOTE — Progress Notes (Signed)
  Subjective:    Patient ID: April Mathis, female    DOB: 1950/07/14, 62 y.o.   MRN: [id number]  HPI Patient is here today for an annual exam.   She requests me to check HIV as she has been active with a partner who was diagnosed with STD's other then HIV but she feels she is at high risk. She was also recently treated for vaginal candidiasis.  Her weight has gone up by   No complaints today.    Review of Systems  Constitutional: Negative for fever, activity change and appetite change.  HENT: Negative for sore throat.   Respiratory: Negative for cough and shortness of breath.   Cardiovascular: Negative for chest pain and leg swelling.  Gastrointestinal: Negative for nausea, abdominal pain, diarrhea, constipation and abdominal distention.  Genitourinary: Negative for frequency, hematuria and difficulty urinating.  Neurological: Negative for dizziness and headaches.  Psychiatric/Behavioral: Negative for suicidal ideas and behavioral problems.       Objective:   Physical Exam  Constitutional: She is oriented to person, place, and time. She appears well-developed and well-nourished.  HENT:  Head: Normocephalic and atraumatic.  Eyes: Conjunctivae and EOM are normal. Pupils are equal, round, and reactive to light. No scleral icterus.  Neck: Normal range of motion. Neck supple. No JVD present. No thyromegaly present.  Cardiovascular: Normal rate, regular rhythm, normal heart sounds and intact distal pulses.  Exam reveals no gallop and no friction rub.   No murmur heard. Pulmonary/Chest: Effort normal and breath sounds normal. No respiratory distress. She has no wheezes. She has no rales.  Abdominal: Soft. Bowel sounds are normal. She exhibits no distension and no mass. There is no tenderness. There is no rebound and no guarding.  Musculoskeletal: Normal range of motion. She exhibits no edema and no tenderness.  Lymphadenopathy:    She has no cervical adenopathy.  Neurological: She is  alert and oriented to person, place, and time.  Psychiatric: She has a normal mood and affect. Her behavior is normal.          Assessment & Plan:

## 2012-04-02 ENCOUNTER — Encounter: Payer: Self-pay | Admitting: Vascular Surgery

## 2012-04-03 ENCOUNTER — Ambulatory Visit: Payer: Medicare Other | Admitting: Vascular Surgery

## 2012-06-05 ENCOUNTER — Ambulatory Visit: Payer: Medicare Other | Admitting: Vascular Surgery

## 2012-06-20 ENCOUNTER — Other Ambulatory Visit: Payer: Self-pay | Admitting: Internal Medicine

## 2012-06-26 ENCOUNTER — Ambulatory Visit: Payer: Medicare Other | Admitting: Vascular Surgery

## 2012-06-26 NOTE — Telephone Encounter (Signed)
Patient needs  An appt  To be seen

## 2012-06-27 ENCOUNTER — Encounter: Payer: Self-pay | Admitting: Vascular Surgery

## 2012-06-28 ENCOUNTER — Ambulatory Visit: Payer: Medicare Other | Admitting: Vascular Surgery

## 2012-06-28 ENCOUNTER — Telehealth: Payer: Self-pay | Admitting: Vascular Surgery

## 2012-06-28 NOTE — Telephone Encounter (Signed)
Message copied by Jena Gauss on Thu Jun 28, 2012  4:48 PM ------      Message from: Marcellus Scott      Created: Thu Jun 28, 2012  9:25 AM      Regarding: CANCELLATION FOR TODAY       Darling just called to cancel her appointment for today. I sent her up front to reschedule her appointment.            Thanks      Revonda Standard ------

## 2012-07-19 ENCOUNTER — Encounter: Payer: Self-pay | Admitting: Vascular Surgery

## 2012-07-23 ENCOUNTER — Ambulatory Visit: Payer: Medicare Other | Admitting: Vascular Surgery

## 2012-08-13 ENCOUNTER — Encounter: Payer: Self-pay | Admitting: Vascular Surgery

## 2012-08-14 ENCOUNTER — Encounter: Payer: Self-pay | Admitting: Vascular Surgery

## 2012-08-14 ENCOUNTER — Ambulatory Visit (INDEPENDENT_AMBULATORY_CARE_PROVIDER_SITE_OTHER): Payer: Medicare Other | Admitting: Vascular Surgery

## 2012-08-14 VITALS — BP 143/79 | HR 75 | Resp 16 | Ht 62.0 in | Wt 180.0 lb

## 2012-08-14 DIAGNOSIS — I83893 Varicose veins of bilateral lower extremities with other complications: Secondary | ICD-10-CM

## 2012-08-14 NOTE — Progress Notes (Signed)
Subjective:     Patient ID: April Mathis, female   DOB: April 14, 1950, 62 y.o.   MRN: [id number]  HPI this 62 year old female returns for continued followup regarding her severe venous insufficiency of the left leg with painful bulging varicosities do to reflux in the left small saphenous vein. She has tried long-leg elastic compression stockings 20-30 mm gradient as well as elevation and ibuprofen with no improvement in her symptoms. Continues to affect her daily living and cause constant discomfort.  Past Medical History  Diagnosis Date  . Hypertension   . Bilateral leg edema   . Obesity   . Smoking   . Knee pain   . Left knee DJD 2002    MRI done in 2002  . History of recurrent UTIs   . Varicose vein     History  Substance Use Topics  . Smoking status: Current Every Day Smoker -- 0.50 packs/day for 20 years    Types: Cigarettes  . Smokeless tobacco: Never Used  . Alcohol Use: No    Family History  Problem Relation Age of Onset  . Anesthesia problems Neg Hx   . Cancer Mother     LUNG    Allergies  Allergen Reactions  . Penicillins Hives    Current outpatient prescriptions:lisinopril (PRINIVIL,ZESTRIL) 10 MG tablet, TAKE ONE TABLET BY MOUTH EVERY DAY, Disp: 90 tablet, Rfl: 0  BP 143/79  Pulse 75  Resp 16  Ht 5\' 2"  (1.575 m)  Wt 180 lb (81.647 kg)  BMI 32.91 kg/m2  Body mass index is 32.91 kg/(m^2).         Review of Systems complains of left knee pain and has had recent arthroscopy. Denies chest pain, dyspnea on exertion, PND, orthopnea, hemoptysis     Objective:   Physical Exam blood pressure 143/79 heart rate 75 respirations 16 General well-developed well-nourished female in no apparent distress alert and oriented x3 Lungs no rhonchi or wheezing Left leg with bulging varicosities in the posterior calf and 1+ edema with reticular and spider veins around the ankle medially and laterally. 3+ dorsalis pedis pulse palpable.  Previous duplex scan confirms  gross reflux left small saphenous vein supplying these bulging varicosities in the left leg    Assessment:     Severe venous insufficiency left leg with gross reflux left small saphenous vein causing symptomatic painful varicosities-resistant to conservative measures    Plan:     Plan laser ablation left small saphenous vein in the near future and patient will then return in 3 months to see if stab phlebectomy will be indicated

## 2012-08-17 ENCOUNTER — Other Ambulatory Visit: Payer: Self-pay | Admitting: *Deleted

## 2012-08-17 DIAGNOSIS — I83893 Varicose veins of bilateral lower extremities with other complications: Secondary | ICD-10-CM

## 2012-09-10 ENCOUNTER — Other Ambulatory Visit: Payer: Medicare Other | Admitting: Vascular Surgery

## 2012-09-18 ENCOUNTER — Ambulatory Visit: Payer: Medicare Other | Admitting: Vascular Surgery

## 2012-09-21 ENCOUNTER — Encounter: Payer: Self-pay | Admitting: Vascular Surgery

## 2012-09-24 ENCOUNTER — Ambulatory Visit (INDEPENDENT_AMBULATORY_CARE_PROVIDER_SITE_OTHER): Payer: Medicare Other | Admitting: Vascular Surgery

## 2012-09-24 ENCOUNTER — Encounter: Payer: Self-pay | Admitting: Vascular Surgery

## 2012-09-24 VITALS — BP 117/79 | HR 73 | Resp 16 | Ht 62.0 in | Wt 183.0 lb

## 2012-09-24 DIAGNOSIS — I83893 Varicose veins of bilateral lower extremities with other complications: Secondary | ICD-10-CM

## 2012-09-24 NOTE — Progress Notes (Signed)
Laser Ablation Procedure      Date: 09/24/2012    April Mathis DOB:[birth year]  Consent signed: Yes  Surgeon:J.D. Hart [place]  Procedure: Laser Ablation: left Small Saphenous Vein  BP 117/79  Pulse 73  Resp 16  Ht 5\' 2"  (1.575 m)  Wt 183 lb (83.008 kg)  BMI 33.46 kg/m2  Start time: 11   End time: 12  Tumescent Anesthesia: 180 cc 0.9% NaCl with 50 cc Lidocaine HCL with 1% Epi and 15 cc 8.4% NaHCO3  Local Anesthesia: 2 cc Lidocaine HCL and NaHCO3 (ratio 2:1)  Pulsed mode: 15 watts, 500 ms delay, 1.0 duration Total energy: 1177, total pulses: 79, total time: 1:18    Patient tolerated procedure well: Yes  Notes:   Description of Procedure:  After marking the course of the saphenous vein and the secondary varicosities in the standing position, the patient was placed on the operating table in the prone position, and the left leg was prepped and draped in sterile fashion. Local anesthetic was administered, and under ultrasound guidance the saphenous vein was accessed with a micro needle and guide wire; then the micro puncture sheath was placed. A guide wire was inserted to the saphenopopliteal junction, followed by a 5 french sheath.  The position of the sheath and then the laser fiber below the junction was confirmed using the ultrasound and visualization of the aiming beam.  Tumescent anesthesia was administered along the course of the saphenous vein using ultrasound guidance. Protective laser glasses were placed on the patient, and the laser was fired at 15 watts.  For a total of 1177 joules.  A steri strip was applied to the puncture site.    ABD pads and thigh high compression stockings were applied.  Ace wrap bandages were applied over the phlebectomy sites and at the top of the saphenopopliteal junction.  Blood loss was less than 15 cc.  The patient ambulated out of the operating room having tolerated the procedure well.

## 2012-09-24 NOTE — Progress Notes (Signed)
Subjective:     Patient ID: MYCHELLE KENDRA, female   DOB: 07/26/1950, 62 y.o.   MRN: [id number]  HPI this 62 year old female had laser ablation of left small saphenous vein performed under local tumescent anesthesia for severe venous hypertension with swelling and pain in varicosities. A total of 1177 J of energy was utilized. She tolerated the procedure well  Review of Systems     Objective:   Physical Exam BP 117/79  Pulse 73  Resp 16  Ht 5\' 2"  (1.575 m)  Wt 183 lb (83.008 kg)  BMI 33.46 kg/m2       Assessment:     Well-tolerated laser ablation left small saphenous vein performed under local tumescent anesthesia for venous hypertension with swelling and pain    Plan:     Return in one week for venous duplex exam left leg to confirm closure left small saphenous vein

## 2012-09-25 ENCOUNTER — Telehealth: Payer: Self-pay | Admitting: *Deleted

## 2012-09-25 ENCOUNTER — Telehealth: Payer: Self-pay | Admitting: Vascular Surgery

## 2012-09-25 NOTE — Telephone Encounter (Signed)
Talked at length about the Ibuprofen and reviewed all the post op instructions. Pt is doing well. No pain. Reminded her of her fu appt next week.

## 2012-09-25 NOTE — Telephone Encounter (Signed)
This patient called last night regarding her taking Ibuprofen over the age of 6.  She read that taking Ibuprofen can cause ulcers in patients over the age of 80.  I tried to reassure her that this would be a brief course and she could either not take it if she has GI upset or we could change it but a short course would be safe.    She also read on the label that you should take the medication with 3 alcoholic beverages.  I reassured her that although I did not have the bottle in front of me but it probably did not say that and to not drink alcohol with the Ibuprofen.   She wanted Dr Hart Rochester or his nurse to call her this morning.   Leonette Most

## 2012-09-28 ENCOUNTER — Encounter: Payer: Self-pay | Admitting: Vascular Surgery

## 2012-10-01 ENCOUNTER — Encounter (INDEPENDENT_AMBULATORY_CARE_PROVIDER_SITE_OTHER): Payer: Medicare Other | Admitting: Vascular Surgery

## 2012-10-01 ENCOUNTER — Encounter: Payer: Self-pay | Admitting: Vascular Surgery

## 2012-10-01 ENCOUNTER — Ambulatory Visit (INDEPENDENT_AMBULATORY_CARE_PROVIDER_SITE_OTHER): Payer: Medicare Other | Admitting: Vascular Surgery

## 2012-10-01 VITALS — BP 157/82 | HR 71 | Resp 16 | Ht 62.0 in | Wt 181.0 lb

## 2012-10-01 DIAGNOSIS — I83893 Varicose veins of bilateral lower extremities with other complications: Secondary | ICD-10-CM

## 2012-10-01 DIAGNOSIS — M79609 Pain in unspecified limb: Secondary | ICD-10-CM

## 2012-10-01 DIAGNOSIS — M7989 Other specified soft tissue disorders: Secondary | ICD-10-CM

## 2012-10-01 NOTE — Addendum Note (Signed)
Addended by: Adria Dill L on: 10/01/2012 04:35 PM   Modules accepted: Orders

## 2012-10-01 NOTE — Progress Notes (Signed)
Subjective:     Patient ID: April Mathis, female   DOB: May 15, 1950, 62 y.o.   MRN: [id number]  HPI this 62 year old female returns 1 week post laser ablation left small saphenous vein for severe reflux with painful varicosities. She states of the leg has felt much better. She had some discomfort in the calf initially which is resolving. She denies any chest pain dyspnea on exertion PND orthopnea or hemoptysis. She has been wearing her elastic compression stocking but has not been able to take ibuprofen.  Past Medical History  Diagnosis Date  . Hypertension   . Bilateral leg edema   . Obesity   . Smoking   . Knee pain   . Left knee DJD 2002    MRI done in 2002  . History of recurrent UTIs   . Varicose vein     History  Substance Use Topics  . Smoking status: Current Every Day Smoker -- 0.50 packs/day for 20 years    Types: Cigarettes  . Smokeless tobacco: Never Used  . Alcohol Use: No    Family History  Problem Relation Age of Onset  . Anesthesia problems Neg Hx   . Cancer Mother     LUNG    Allergies  Allergen Reactions  . Penicillins Hives    Current outpatient prescriptions:clindamycin (CLEOCIN) 150 MG capsule, Take 150 mg by mouth 3 (three) times daily., Disp: , Rfl: ;  lisinopril (PRINIVIL,ZESTRIL) 10 MG tablet, TAKE ONE TABLET BY MOUTH EVERY DAY, Disp: 90 tablet, Rfl: 0  BP 157/82  Pulse 71  Resp 16  Ht 5\' 2"  (1.575 m)  Wt 181 lb (82.101 kg)  BMI 33.1 kg/m2  Body mass index is 33.1 kg/(m^2).          Review of Systems denies chest pain or hemoptysis    Objective:   Physical Exam BP 157/82  Pulse 71  Resp 16  Ht 5\' 2"  (1.575 m)  Wt 181 lb (82.101 kg)  BMI 33.1 kg/m2  General well-developed well-nourished female in no apparent stress alert and oriented x3 Lungs no rhonchi or wheezing Left leg with chronic 1+ edema. Minimal tenderness along course of small saphenous vein. Bulging varicosities in the medial calf and posterior calf.  Today I  ordered a venous duplex exam the left leg which are reviewed and interpreted. It reveals good closure of the left small saphenous vein with some minimal nonoccluding bulging of the thrombus into the popliteal vein which is not obstructed. Minor the deep system is widely patent.      Assessment:     Successful laser ablation left small saphenous vein with minimal bulging of thrombus into popliteal vein which is nonobstructing and asymptomatic    Plan:     Patient will obtain you taking daily aspirin therapy and return in 3 months for repeat venous duplex exam left leg If she should develop severe edema in the left calf and ankle she will call us immediately for repeat duplex exam left leg to rule out DVT

## 2012-10-05 ENCOUNTER — Telehealth: Payer: Self-pay | Admitting: *Deleted

## 2012-10-05 NOTE — Telephone Encounter (Signed)
Returned call to patient.  She states that she thinks she "over did" herself with activity. She is complaining of an achiness feeling in leg. No swelling in foot or ankle.  Patient is unable to take ibuprofen due to it raising her blood pressure. She is on one low dose aspirin daily. Per Clementeen Hoof, RN she asked me to inform the patient to elevate leg, take tylenol and apply heat.  The patient understood the directions.

## 2012-10-08 ENCOUNTER — Encounter: Payer: Self-pay | Admitting: Vascular Surgery

## 2012-10-09 ENCOUNTER — Ambulatory Visit (INDEPENDENT_AMBULATORY_CARE_PROVIDER_SITE_OTHER): Payer: Medicare Other | Admitting: Internal Medicine

## 2012-10-09 ENCOUNTER — Encounter: Payer: Self-pay | Admitting: Internal Medicine

## 2012-10-09 VITALS — BP 110/72 | HR 67 | Temp 97.2°F | Ht 62.0 in | Wt 183.8 lb

## 2012-10-09 DIAGNOSIS — R7309 Other abnormal glucose: Secondary | ICD-10-CM

## 2012-10-09 DIAGNOSIS — F17200 Nicotine dependence, unspecified, uncomplicated: Secondary | ICD-10-CM

## 2012-10-09 DIAGNOSIS — R7303 Prediabetes: Secondary | ICD-10-CM

## 2012-10-09 DIAGNOSIS — Z0000 Encounter for general adult medical examination without abnormal findings: Secondary | ICD-10-CM

## 2012-10-09 DIAGNOSIS — I10 Essential (primary) hypertension: Secondary | ICD-10-CM

## 2012-10-09 LAB — GLUCOSE, CAPILLARY: Glucose-Capillary: 86 mg/dL (ref 70–99)

## 2012-10-09 LAB — CBC WITH DIFFERENTIAL/PLATELET
Basophils Absolute: 0 10*3/uL (ref 0.0–0.1)
Basophils Relative: 1 % (ref 0–1)
Eosinophils Absolute: 0.1 10*3/uL (ref 0.0–0.7)
Eosinophils Relative: 2 % (ref 0–5)
MCH: 28.5 pg (ref 26.0–34.0)
MCHC: 33.3 g/dL (ref 30.0–36.0)
MCV: 85.6 fL (ref 78.0–100.0)
Monocytes Absolute: 0.4 10*3/uL (ref 0.1–1.0)
Platelets: 271 10*3/uL (ref 150–400)
RDW: 14.3 % (ref 11.5–15.5)
WBC: 6.1 10*3/uL (ref 4.0–10.5)

## 2012-10-09 LAB — BASIC METABOLIC PANEL WITH GFR
BUN: 14 mg/dL (ref 6–23)
Calcium: 10.1 mg/dL (ref 8.4–10.5)
Creat: 0.65 mg/dL (ref 0.50–1.10)
GFR, Est Non African American: >89 mL/min

## 2012-10-09 LAB — POCT GLYCOSYLATED HEMOGLOBIN (HGB A1C): Hemoglobin A1C: 5.5

## 2012-10-09 NOTE — Assessment & Plan Note (Signed)
HgbA1c 5.5 with lifestyle modification.

## 2012-10-09 NOTE — Patient Instructions (Addendum)
General Instructions: 1. Please return to clinic in 3 months for follow-up.   2. Please take all medications as prescribed.     3. If you have worsening of your symptoms or new symptoms arise, please call the clinic ([phone]), or go to the ER immediately if symptoms are severe.   Treatment Goals:  Goals (1 Years of Data) as of 10/09/12   None      Progress Toward Treatment Goals:  Treatment Goal 10/09/2012  Blood pressure at goal  Stop smoking smoking less    Self Care Goals & Plans:  Self Care Goal 10/09/2012  Manage my medications -  Eat healthy foods -  Meeting treatment goals maintain the current self-care plan       Care Management & Community Referrals:

## 2012-10-09 NOTE — Assessment & Plan Note (Addendum)
Patient declines flu shot and referral for colonoscopy.  Discussed the benefits of both with patient but she declines both.  Will re-address at next visit.

## 2012-10-09 NOTE — Assessment & Plan Note (Signed)
Well controlled on Lisinopril 10mg  daily.  Continue current regimen and recheck at next visit.

## 2012-10-09 NOTE — Progress Notes (Signed)
  Subjective:    Patient ID: April Mathis, female    DOB: 03-04-1950, 62 y.o.   MRN: [id number]  HPI Comments: April Mathis is a 62 year old woman with a PMH of HTN and pre-DM who presents for follow-up.  She recently had laser ablation of varicose veins and was found to have non-occlusive lower extremity DVT.  She has been instructed to take ASA daily and return to the vascular surgeon for lower extremity Duplex in three months.  She denies increased pain or swelling, CP/SOB or fever.    She has a history of pre-DM.  She has implemented lifestyle changes over the past few years.  Her last HgbA1c in February 2014 was 5.6.  Her Hgb A1c today is 5.5.     Review of Systems  Constitutional: Negative for fever, chills and appetite change.  Eyes: Negative for visual disturbance.  Respiratory: Negative for shortness of breath.   Cardiovascular: Negative for chest pain.  Gastrointestinal: Negative for nausea, vomiting, diarrhea and constipation.  Genitourinary: Negative for dysuria and hematuria.       Objective:   Physical Exam  Constitutional: She is oriented to person, place, and time. No distress.  HENT:  Mouth/Throat: Oropharynx is clear and moist. No oropharyngeal exudate.  Eyes: Pupils are equal, round, and reactive to light.  Neck: Neck supple.  Cardiovascular: Normal rate, regular rhythm and normal heart sounds.   Pulmonary/Chest: Effort normal and breath sounds normal. No respiratory distress. She has no wheezes. She has no rales.  Abdominal: Soft. Bowel sounds are normal. She exhibits no distension. There is no tenderness.  Musculoskeletal:  Left lower extremity in support stocking.  Neurological: She is alert and oriented to person, place, and time.  Skin: Skin is warm. She is not diaphoretic.  Psychiatric: She has a normal mood and affect. Her behavior is normal.          Assessment & Plan:  Please see problem based assessment and plan.

## 2012-10-09 NOTE — Assessment & Plan Note (Signed)
Patient advised to quit.  Will re-address at next visit as patient was in hurry to leave after long wait in office.

## 2012-10-10 ENCOUNTER — Telehealth: Payer: Self-pay | Admitting: *Deleted

## 2012-10-10 ENCOUNTER — Telehealth: Payer: Self-pay | Admitting: Vascular Surgery

## 2012-10-10 NOTE — Telephone Encounter (Signed)
Patient is a varicose vein patient of  Dr. Hart Rochester and has a question about aspirin dosage.

## 2012-10-10 NOTE — Telephone Encounter (Signed)
April Mathis was told to take 81mg  Asp q day but she bought 325mg  instead. I talked to her at length about how she needs to just take 81mg  and what to look for when she goes to buy them. Hopefully this is cleared up.

## 2012-10-11 NOTE — Progress Notes (Signed)
I saw and evaluated the patient.  I personally confirmed the key portions of the history and exam documented by Dr. Wilson and I reviewed pertinent patient test results.  The assessment, diagnosis, and plan were formulated together and I agree with the documentation in the resident's note. 

## 2012-10-12 ENCOUNTER — Telehealth: Payer: Self-pay | Admitting: *Deleted

## 2012-10-12 NOTE — Telephone Encounter (Signed)
Pt wants [facility] to call [facility] [phone] - no paper was brought to clinic 10/09/12 to fax to transportation. I called number and reported pt had appt 10/09/12 and forgot papers. Return call to pt and aware they accept our message. Stanton Kidney Michiko Lineman RN 10/12/12 10:10AM

## 2012-10-17 ENCOUNTER — Other Ambulatory Visit: Payer: Self-pay | Admitting: *Deleted

## 2012-10-18 MED ORDER — LISINOPRIL 10 MG PO TABS: ORAL_TABLET | ORAL | Status: DC

## 2012-10-19 NOTE — Telephone Encounter (Signed)
Pt aware.

## 2012-11-05 ENCOUNTER — Telehealth: Payer: Self-pay | Admitting: *Deleted

## 2012-11-05 ENCOUNTER — Telehealth: Payer: Self-pay

## 2012-11-05 DIAGNOSIS — B379 Candidiasis, unspecified: Secondary | ICD-10-CM

## 2012-11-05 NOTE — Telephone Encounter (Signed)
Pt calls and states that she would like a referral to gyn- dr [name], she states she was told in clinic that she couldn't go but dr Elsie Stain receptionist said she could with a referral, she states she has had several yeast infections this year and feels dr [name] could help, could you please have your nurse complete the referral and let the pt know, she was somewhat upset about this, thanks, triage

## 2012-11-20 DIAGNOSIS — B379 Candidiasis, unspecified: Secondary | ICD-10-CM | POA: Insufficient documentation

## 2012-11-20 NOTE — Telephone Encounter (Signed)
Have placed the referral.   Myriam Jacobson - - can you route to the appropriate nurse?

## 2012-12-31 ENCOUNTER — Encounter: Payer: Self-pay | Admitting: Vascular Surgery

## 2013-01-01 ENCOUNTER — Ambulatory Visit (HOSPITAL_COMMUNITY)
Admission: RE | Admit: 2013-01-01 | Discharge: 2013-01-01 | Disposition: A | Discharge status: Home / Self Care | Payer: Medicare Other | Source: Ambulatory Visit | Attending: Vascular Surgery | Admitting: Vascular Surgery

## 2013-01-01 ENCOUNTER — Ambulatory Visit: Payer: Medicare Other | Admitting: Vascular Surgery

## 2013-01-01 ENCOUNTER — Encounter: Payer: Self-pay | Admitting: Vascular Surgery

## 2013-01-01 ENCOUNTER — Encounter (HOSPITAL_COMMUNITY): Payer: Medicare Other

## 2013-01-01 ENCOUNTER — Ambulatory Visit (INDEPENDENT_AMBULATORY_CARE_PROVIDER_SITE_OTHER): Payer: Medicare Other | Admitting: Vascular Surgery

## 2013-01-01 VITALS — BP 167/84 | HR 73 | Resp 16 | Ht 62.0 in | Wt 180.0 lb

## 2013-01-01 DIAGNOSIS — I83893 Varicose veins of bilateral lower extremities with other complications: Secondary | ICD-10-CM

## 2013-01-01 NOTE — Progress Notes (Signed)
Subjective:     Patient ID: April Mathis, female   DOB: [birth year], 62 y.o.   MRN: [id number]  HPI this 62 year old female returns 3 months post laser ablation left small saphenous vein for painful varicosities. She states the swelling and discomfort in the leg have dramatically improved. She is wearing short leg elastic compression stockings on occasion. She did have some bulging into the popliteal vein where the ablation occurred and we placed her on daily aspirin and repeated the ultrasound today which reveals complete resolution of this bulging into the popliteal vein. Patient has no specific complaints today.  Past Medical History  Diagnosis Date  . Hypertension   . Bilateral leg edema   . Obesity   . Smoking   . Knee pain   . Left knee DJD 2002    MRI done in 2002  . History of recurrent UTIs   . Varicose vein     History  Substance Use Topics  . Smoking status: Current Every Day Smoker -- 0.50 packs/day for 20 years    Types: Cigarettes  . Smokeless tobacco: Never Used  . Alcohol Use: No    Family History  Problem Relation Age of Onset  . Anesthesia problems Neg Hx   . Cancer Mother     LUNG    Allergies  Allergen Reactions  . Penicillins Hives    Current outpatient prescriptions:clindamycin (CLEOCIN) 150 MG capsule, Take 150 mg by mouth 3 (three) times daily., Disp: , Rfl: ;  lisinopril (PRINIVIL,ZESTRIL) 10 MG tablet, TAKE ONE TABLET BY MOUTH EVERY DAY, Disp: 90 tablet, Rfl: 0  BP 167/84  Pulse 73  Resp 16  Ht 5\' 2"  (1.575 m)  Wt 180 lb (81.647 kg)  BMI 32.91 kg/m2  Body mass index is 32.91 kg/(m^2).           Review of Systems denies chest pain, dyspnea on exertion, PND, orthopnea, hemoptysis. Complains of left knee pain.    Objective:   Physical Exam BP 167/84  Pulse 73  Resp 16  Ht 5\' 2"  (1.575 m)  Wt 180 lb (81.647 kg)  BMI 32.91 kg/m2  General well-developed well-nourished female no apparent stress alert and oriented x3 Lungs no  rhonchi or wheezing Left leg with minimal distal edema. The bulging varicosities in the medial calf and distal thigh are much less obvious and not symptomatic. No active ulceration noted.  Today I ordered a venous duplex exam the left leg which are reviewed and interpreted. There is no DVT. Small saphenous vein remains closed from previous ablation with no bulging thrombus in the popliteal vein      Assessment:     Successful laser ablation left small saphenous vein with resolution of swelling and symptoms left leg    Plan:     Patient returned see [place] on when necessary basis Have recommended continuing daily aspirin Have recommended wearing short leg elastic compression stockings on a when necessary basis

## 2013-01-24 ENCOUNTER — Encounter: Payer: Self-pay | Admitting: Internal Medicine

## 2013-01-24 ENCOUNTER — Encounter: Payer: Medicare Other | Admitting: Internal Medicine

## 2013-02-06 ENCOUNTER — Other Ambulatory Visit: Payer: Self-pay | Admitting: *Deleted

## 2013-02-06 MED ORDER — LISINOPRIL 10 MG PO TABS: ORAL_TABLET | ORAL | Status: DC

## 2013-02-07 ENCOUNTER — Emergency Department (HOSPITAL_COMMUNITY)
Admission: EM | Admit: 2013-02-07 | Discharge: 2013-02-08 | Discharge status: Against Medical Advice | Payer: Medicare Other | Attending: Emergency Medicine | Admitting: Emergency Medicine

## 2013-02-07 ENCOUNTER — Encounter (HOSPITAL_COMMUNITY): Payer: Self-pay | Admitting: Emergency Medicine

## 2013-02-07 DIAGNOSIS — Z880 Allergy status to penicillin: Secondary | ICD-10-CM | POA: Insufficient documentation

## 2013-02-07 DIAGNOSIS — S199XXA Unspecified injury of neck, initial encounter: Secondary | ICD-10-CM

## 2013-02-07 DIAGNOSIS — S298XXA Other specified injuries of thorax, initial encounter: Secondary | ICD-10-CM | POA: Insufficient documentation

## 2013-02-07 DIAGNOSIS — Z87440 Personal history of urinary (tract) infections: Secondary | ICD-10-CM | POA: Insufficient documentation

## 2013-02-07 DIAGNOSIS — I10 Essential (primary) hypertension: Secondary | ICD-10-CM | POA: Insufficient documentation

## 2013-02-07 DIAGNOSIS — F17200 Nicotine dependence, unspecified, uncomplicated: Secondary | ICD-10-CM | POA: Insufficient documentation

## 2013-02-07 DIAGNOSIS — M1710 Unilateral primary osteoarthritis, unspecified knee: Secondary | ICD-10-CM | POA: Insufficient documentation

## 2013-02-07 DIAGNOSIS — R609 Edema, unspecified: Secondary | ICD-10-CM | POA: Insufficient documentation

## 2013-02-07 DIAGNOSIS — S0993XA Unspecified injury of face, initial encounter: Secondary | ICD-10-CM | POA: Insufficient documentation

## 2013-02-07 DIAGNOSIS — IMO0002 Reserved for concepts with insufficient information to code with codable children: Secondary | ICD-10-CM | POA: Insufficient documentation

## 2013-02-07 DIAGNOSIS — E669 Obesity, unspecified: Secondary | ICD-10-CM | POA: Insufficient documentation

## 2013-02-07 DIAGNOSIS — S0990XA Unspecified injury of head, initial encounter: Secondary | ICD-10-CM | POA: Insufficient documentation
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
## 2013-02-08 ENCOUNTER — Emergency Department (HOSPITAL_COMMUNITY): Payer: Medicare Other
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
## 2013-02-08 NOTE — ED Notes (Signed)
Patient transported to X-ray 

## 2013-02-08 NOTE — ED Notes (Signed)
Pt advised that since she had called her family members telling them to come and see her in the ED she would be taken off of the XXX privacy, pt stated that she was fine with that and would like a Coke. Pt advised that after writer had spoken with registration she could have something else to drink.

## 2013-02-14 ENCOUNTER — Telehealth: Payer: Self-pay | Admitting: *Deleted

## 2013-02-14 NOTE — Telephone Encounter (Signed)
Ms. Schwandt states she is experiencing pain "in back of my left knee to thigh."  She is requesting pain medication.  Ms. Guzzetta is s/p endovenous laser ablation left small saphenous vein 09-24-2012 by Dr. Kellie Simmering.  Ms. Fabel denies left leg or ankle swelling.  Explained to Ms. Lyne that no pain medication can be prescribed without a coming in for an appointment with the doctor to evaluate the leg pain.  Ms. Abelson states she has several other upcoming appointments with physicians and will call [facility] back if needed. Suggested elevating her leg, wearing compression hose, using heating pad, and taking Ibuprofen prn leg pain.

## 2013-02-15 ENCOUNTER — Telehealth: Payer: Self-pay | Admitting: *Deleted

## 2013-02-15 NOTE — Telephone Encounter (Signed)
Pt informed and will go to ED today.
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
## 2013-02-15 NOTE — Telephone Encounter (Signed)
Pt left before her W/U was complete. Therefore, I would advise that she return to the ED or UC.

## 2013-02-20 ENCOUNTER — Encounter: Payer: Self-pay | Admitting: Internal Medicine

## 2013-02-20 ENCOUNTER — Telehealth: Payer: Self-pay | Admitting: *Deleted

## 2013-02-20 ENCOUNTER — Ambulatory Visit (INDEPENDENT_AMBULATORY_CARE_PROVIDER_SITE_OTHER): Payer: Medicare Other | Admitting: Internal Medicine

## 2013-02-20 VITALS — BP 164/76 | HR 77 | Temp 97.8°F | Ht 62.0 in | Wt 191.8 lb

## 2013-02-20 DIAGNOSIS — Z638 Other specified problems related to primary support group: Secondary | ICD-10-CM

## 2013-02-20 DIAGNOSIS — R7309 Other abnormal glucose: Secondary | ICD-10-CM

## 2013-02-20 DIAGNOSIS — F17200 Nicotine dependence, unspecified, uncomplicated: Secondary | ICD-10-CM

## 2013-02-20 DIAGNOSIS — I10 Essential (primary) hypertension: Secondary | ICD-10-CM

## 2013-02-20 DIAGNOSIS — Z0000 Encounter for general adult medical examination without abnormal findings: Secondary | ICD-10-CM

## 2013-02-20 DIAGNOSIS — Z6379 Other stressful life events affecting family and household: Secondary | ICD-10-CM | POA: Insufficient documentation

## 2013-02-20 LAB — GLUCOSE, CAPILLARY: Glucose-Capillary: 135 mg/dL — ABNORMAL HIGH (ref 70–99)

## 2013-02-20 LAB — POCT GLYCOSYLATED HEMOGLOBIN (HGB A1C): HEMOGLOBIN A1C: 5.4

## 2013-02-20 MED ORDER — LISINOPRIL-HYDROCHLOROTHIAZIDE 10-12.5 MG PO TABS: 1.0000 | ORAL_TABLET | Freq: Every day | ORAL | Status: DC
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
## 2013-02-20 NOTE — Assessment & Plan Note (Signed)
Patient has increased to 1 pack per day due to recent life stressors.  Advised to quit and she says she is aware she needs to.  Will continue to address.

## 2013-02-20 NOTE — Assessment & Plan Note (Signed)
She is insistent on checking HgbA1c today even though it was normal four months ago.  HgbA1c 5.4 today.
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
## 2013-02-20 NOTE — Patient Instructions (Addendum)
1. I have prescribed lisinopril-HCTZ 10/12.5mg  to help with your blood pressure.  Please take 1 pill every day.  2. Please see your eye doctor this month.   3. Please take all medications as prescribed.    4. If you have worsening of your symptoms or new symptoms arise, please call the clinic ([phone]), or go to the ER immediately if symptoms are severe.  Return to clinic in 1 month for blood pressure check.

## 2013-02-20 NOTE — Assessment & Plan Note (Signed)
Continues to decline flu and colonoscopy.  Will give stool cards.

## 2013-02-20 NOTE — Assessment & Plan Note (Signed)
BP Readings from Last 3 Encounters:  02/20/13 164/76  02/07/13 121/89  01/01/13 167/84    Lab Results  Component Value Date   NA 135 10/09/2012   K 5.1 10/09/2012   CREATININE 0.65 10/09/2012    Assessment: Blood pressure control:  uncontrolled Progress toward BP goal:   worsening Comments: elevated today and higher (164/56mmHg) on recheck.  Plan: Medications:  Add HCTZ 12.5 to lisinopril 10mg  (combo pill) Educational resources provided: brochure Self management tools provided: home blood pressure logbook Other plans: Return for BP check in 1 month
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
## 2013-02-21 NOTE — Progress Notes (Signed)
Case discussed with Dr. Wilson at the time of the visit.  We reviewed the resident's history and exam and pertinent patient test results.  I agree with the assessment, diagnosis, and plan of care documented in the resident's note. 

## 2013-02-22 NOTE — Telephone Encounter (Signed)
I would not take HCTZ at this time. Dr. Redmond Pulling, can you address this?

## 2013-02-22 NOTE — Telephone Encounter (Signed)
Pt states she cannot take hctz, she states she took this in 2007 appr and had a bad time with it, stated she ended up in hospital due to BP dropping too much and urine incont. And had to be treated by [facility], states she was told never to take it again. Her ph# [phone]

## 2013-02-23 ENCOUNTER — Telehealth: Payer: Self-pay | Admitting: Internal Medicine

## 2013-02-23 ENCOUNTER — Other Ambulatory Visit: Payer: Self-pay | Admitting: Internal Medicine

## 2013-02-23 NOTE — Telephone Encounter (Signed)
I left a message on April Mathis's voicemail re-iterating that she should not take lisinopril-HCTZ if she has not tolerated HCTZ in the past.  I advised that she continue lisinopril (she has refills available) and give me a call back at the clinic.  She will still need an additional anti-hypertensive agent.

## 2013-02-26 NOTE — Telephone Encounter (Signed)
Dr Redmond Pulling would like pt to be seen in next 2 weeks, she would prefer to see pt herself but can be seen by someone else to address blood pressure meds

## 2013-03-11 ENCOUNTER — Encounter: Payer: Self-pay | Admitting: Internal Medicine

## 2013-03-11 ENCOUNTER — Ambulatory Visit (INDEPENDENT_AMBULATORY_CARE_PROVIDER_SITE_OTHER): Payer: Medicare Other | Admitting: Internal Medicine

## 2013-03-11 VITALS — BP 124/73 | HR 79 | Temp 98.8°F | Wt 187.5 lb

## 2013-03-11 DIAGNOSIS — F17200 Nicotine dependence, unspecified, uncomplicated: Secondary | ICD-10-CM

## 2013-03-11 DIAGNOSIS — I10 Essential (primary) hypertension: Secondary | ICD-10-CM

## 2013-03-11 DIAGNOSIS — Z0000 Encounter for general adult medical examination without abnormal findings: Secondary | ICD-10-CM

## 2013-03-11 DIAGNOSIS — Z1211 Encounter for screening for malignant neoplasm of colon: Secondary | ICD-10-CM

## 2013-03-11 DIAGNOSIS — M62838 Other muscle spasm: Secondary | ICD-10-CM

## 2013-03-11 DIAGNOSIS — Z720 Tobacco use: Secondary | ICD-10-CM

## 2013-03-11 LAB — BASIC METABOLIC PANEL WITH GFR
BUN: 10 mg/dL (ref 6–23)
CO2: 27 mEq/L (ref 19–32)
Calcium: 9.2 mg/dL (ref 8.4–10.5)
Chloride: 103 mEq/L (ref 96–112)
Creat: 0.54 mg/dL (ref 0.50–1.10)
Glucose, Bld: 124 mg/dL — ABNORMAL HIGH (ref 70–99)
POTASSIUM: 4.2 meq/L (ref 3.5–5.3)
Sodium: 140 mEq/L (ref 135–145)

## 2013-03-11 MED ORDER — NICOTINE 14 MG/24HR TD PT24: 14.0000 mg | MEDICATED_PATCH | Freq: Every day | TRANSDERMAL | Status: DC

## 2013-03-11 NOTE — Assessment & Plan Note (Signed)
  Assessment: Progress toward smoking cessation:  smoking the same amount Barriers to progress toward smoking cessation:    Comments:   Plan: Instruction/counseling given:  I counseled patient on the dangers of tobacco use, advised patient to stop smoking, and reviewed strategies to maximize success. Educational resources provided:  [employer]) brochure Self management tools provided:    Medications to assist with smoking cessation:  Nicotine Patch Patient agreed to the following self-care plans for smoking cessation:    Other plans: Patient wants to cut down on smoking. She was encouraged to do so. Will give prescription of nicotine patch

## 2013-03-11 NOTE — Assessment & Plan Note (Signed)
BP Readings from Last 3 Encounters:  03/11/13 124/73  02/20/13 164/76  02/07/13 121/89    Lab Results  Component Value Date   NA 135 10/09/2012   K 5.1 10/09/2012   CREATININE 0.65 10/09/2012    Assessment: Blood pressure control: controlled Progress toward BP goal:  at goal Comments:   Plan: Medications:  continue current medications Educational resources provided: brochure Self management tools provided:   Other plans: Patient's blood pressure is 124/73. Will continue current regimen, lisinopril 20 mg daily. Patient may not be compliant to this dose, because she wants to go back to 10 mg lisinopril daily. Patient was instructed to continue this medication if her blood pressure is consistently higher than 140/90 and she may decrease lisinopril dose to 10 mg daily if her blood pressure is consistently lower than 120/80. She agreed to do so. She will come back in 3 months for reevaluation. Will check her BMP today.

## 2013-03-11 NOTE — Progress Notes (Signed)
Patient ID: April Mathis, female   DOB: 11-18-1950, 63 y.o.   MRN: [id number] Subjective:   Patient ID: April Mathis female   DOB: 03-Aug-1950 63 y.o.   MRN: [id number]  CC:   Follow up visit.   HPI:  [name] is a 63 y.o. lady with past medical history as outlined below, who presents for a followup visit today  1. HTN: Patient's blood pressure was elevated in previous visit on 02/20/13. His blood pressure medications were adjusted. She used to take lisinopril 10 mg daily. Hydrochlorothiazide 12.5 mg daily was added to her regimen in that visit. She was supposed to take Prinzide combined pill,10 mg-12.5 mg daily. After started taking the new medication, she urinates a lot which she did not like it. Then she changed her regimen to lisinopril 20 mg daily by herself. Her blood pressure is 124/73 today. She does not have chest pain, shortness of breath, leg edema.  2. Tobacco abuse: Patient has moked one pack per day for approximately 20 years. She wants to cut down on smoking. She asks for Nicotine patch prescription today.   ROS:  Denies fever, chills, fatigue, headaches, cough, chest pain, SOB,  abdominal pain, diarrhea, constipation, dysuria, urgency, frequency, hematuria or leg swelling.   Current Outpatient Prescriptions  Medication Sig Dispense Refill  . ibuprofen (ADVIL,MOTRIN) 200 MG tablet Take 600 mg by mouth every 6 (six) hours as needed. For foot pain      . lisinopril-hydrochlorothiazide (PRINZIDE,ZESTORETIC) 10-12.5 MG per tablet Take 1 tablet by mouth daily.  30 tablet  2   No current facility-administered medications for this visit.   Family History  Problem Relation Age of Onset  . Anesthesia problems Neg Hx   . Cancer Mother     LUNG   History   Social History  . Marital Status: Divorced    Spouse Name: N/A    Number of Children: N/A  . Years of Education: N/A   Social History Main Topics  . Smoking status: Current Every Day Smoker -- 1.00 packs/day for 20  years    Types: Cigarettes  . Smokeless tobacco: Never Used  . Alcohol Use: No  . Drug Use: No  . Sexual Activity: Yes    Birth Control/ Protection: None   Other Topics Concern  . None   Social History Narrative  . None    Review of Systems: Full 14-point review of systems otherwise negative. See HPI.   Objective:  Physical Exam: Filed Vitals:   03/11/13 1024  BP: 124/73  Pulse: 79  Temp: 98.8 F (37.1 C)  TempSrc: Oral  Weight: 187 lb 8 oz (85.049 kg)  SpO2: 96%   Constitutional: Vital signs reviewed.  Patient is a well-developed and well-nourished, in no acute distress and cooperative with exam.   HEENT:  Head: Normocephalic and atraumatic Mouth: no erythema or exudates, MMM Eyes: PERRL, EOMI, conjunctivae normal, No scleral icterus.  Neck: Supple, Trachea midline normal ROM, No JVD  Cardiovascular: RRR, S1 normal, S2 normal, no MRG, pulses symmetric and intact bilaterally Pulmonary/Chest: CTAB, no wheezes, rales, or rhonchi Abdominal: Soft. Non-tender, non-distended, bowel sounds are normal, no masses, organomegaly, or guarding present.  GU: no CVA tenderness Musculoskeletal: No joint deformities, erythema, or stiffness, ROM full and non-tender Extremities: No leg edema Hematology: no cervical, inginal, or axillary adenopathy.  Neurological: A&O x3, Strength is normal and symmetric bilaterally, cranial nerve II-XII are grossly intact, no focal motor deficit, sensory intact to light touch bilaterally.  Skin: Warm, dry and intact. No rash, cyanosis, or clubbing.  Psychiatric: Normal mood and affect. No suicidal or homicidal ideation.  Assessment & Plan:

## 2013-03-11 NOTE — Assessment & Plan Note (Signed)
-  Patient refused flu shot today, will postpone. Patient refused colonoscopy. She was supposed to be given hemoccult cards in previous visit, but she did not receive it. Will give her hemoccult cards today.

## 2013-03-11 NOTE — Patient Instructions (Signed)
1. Please take lisinopril 20 mg daily.  2. If you have worsening of your symptoms or new symptoms arise, please call the clinic [phone]), or go to the ER immediately if symptoms are severe.  Please bring in all your medication bottles with you in next visit.

## 2013-03-12 NOTE — Telephone Encounter (Signed)
Pt was seen in clinic 02/20/13 and 03/11/13.

## 2013-03-13 NOTE — Progress Notes (Signed)
Case discussed with Dr. Blaine Hamper at the time of the visit.  We reviewed the resident's history and exam and pertinent patient test results.  I agree with the assessment, diagnosis, and plan of care documented in the resident's note.

## 2013-03-22 ENCOUNTER — Other Ambulatory Visit: Payer: Self-pay | Admitting: *Deleted

## 2013-03-22 ENCOUNTER — Telehealth: Payer: Self-pay | Admitting: *Deleted

## 2013-03-22 DIAGNOSIS — I10 Essential (primary) hypertension: Secondary | ICD-10-CM

## 2013-03-22 NOTE — Telephone Encounter (Signed)
Ms. April Mathis EMR from last visit, pt indicated she was independent with ADL's.  Chart does not show record that we have requested PCS.  Dr. Redmond Pulling will need to document pt is in need of a 24hr caregiver in the letter.  As I can not determine from pt's last office visit.  I can notify pt of determination, please advise.

## 2013-03-22 NOTE — Telephone Encounter (Signed)
Pt calls and states she is moving to another apt and wants a 2 bedroom when her physical condition requires that she have someone with her, she would like a letter stating this. i will send this to shanag. As well as to dr Redmond Pulling

## 2013-03-23 ENCOUNTER — Other Ambulatory Visit: Payer: Self-pay | Admitting: Internal Medicine

## 2013-03-23 MED ORDER — LISINOPRIL 20 MG PO TABS: 20.0000 mg | ORAL_TABLET | Freq: Every day | ORAL | Status: DC

## 2013-03-28 ENCOUNTER — Encounter: Payer: Self-pay | Admitting: Internal Medicine

## 2013-03-28 ENCOUNTER — Ambulatory Visit (INDEPENDENT_AMBULATORY_CARE_PROVIDER_SITE_OTHER): Payer: Medicare Other | Admitting: Internal Medicine

## 2013-03-28 VITALS — BP 136/73 | HR 83 | Temp 97.3°F | Ht 62.0 in | Wt 184.1 lb

## 2013-03-28 DIAGNOSIS — I10 Essential (primary) hypertension: Secondary | ICD-10-CM

## 2013-03-28 DIAGNOSIS — M1990 Unspecified osteoarthritis, unspecified site: Secondary | ICD-10-CM

## 2013-03-28 DIAGNOSIS — Z0000 Encounter for general adult medical examination without abnormal findings: Secondary | ICD-10-CM

## 2013-03-28 DIAGNOSIS — F17200 Nicotine dependence, unspecified, uncomplicated: Secondary | ICD-10-CM

## 2013-03-28 MED ORDER — LISINOPRIL 20 MG PO TABS: 20.0000 mg | ORAL_TABLET | Freq: Every day | ORAL | Status: DC

## 2013-03-28 NOTE — Assessment & Plan Note (Signed)
BP Readings from Last 3 Encounters:  03/28/13 136/73  03/11/13 124/73  02/20/13 164/76    Lab Results  Component Value Date   NA 140 03/11/2013   K 4.2 03/11/2013   CREATININE 0.54 03/11/2013    Assessment: Blood pressure control: controlled Progress toward BP goal:  at goal Comments: none  Plan: Medications:  continue current medications Educational resources provided: brochure Self management tools provided: home blood pressure logbook;other (see comments) Other plans: Rx refill Lisinopril 20 mg daily

## 2013-03-28 NOTE — Assessment & Plan Note (Addendum)
  Assessment: Progress toward smoking cessation:  smoking the same amount Barriers to progress toward smoking cessation:  stress Comments: none  Plan: Instruction/counseling given:  I counseled patient on the dangers of tobacco use, advised patient to stop smoking, and reviewed strategies to maximize success. Educational resources provided:  [employer] account [employer]) brochure Self management tools provided:  other (see comments) Medications to assist with smoking cessation:  Nicotine Patch Patient agreed to the following self-care plans for smoking cessation:    Other plans: continue smoking cessation and patch use though pt has not started using yet and prefers gum or pill.  She cant afford the gum and can think of the name of the pill that her daughter took and helped with her smoking.  She does not want to try Chantix

## 2013-03-28 NOTE — Assessment & Plan Note (Signed)
Noted on previous MRI Rx for cane and walker  Patient follows with Dr. Sharol Given and just had steroid injection 02/21/13.  Will try to obtain records  Pain is controlled today but at times she has limited mobility and pain Advised Tylenol prn  Referral to social worker for personal care services if patient qualifies

## 2013-03-28 NOTE — Progress Notes (Signed)
Case discussed with Dr. McLean at the time of the visit.  We reviewed the resident's history and exam and pertinent patient test results.  I agree with the assessment, diagnosis, and plan of care documented in the resident's note.     

## 2013-03-28 NOTE — Assessment & Plan Note (Signed)
Eye MD appt 06/2013  Obtain stool cards today refused colonoscopy and flu shot

## 2013-03-28 NOTE — Progress Notes (Signed)
Subjective:    Patient ID: April Mathis, female    DOB: 08/21/1950, 63 y.o.   MRN: [id number]  HPI Comments: 63 y.o Past Medical History Hypertension (BP 136/73), Bilateral leg edema, Obesity, Smoking, Knee pain with left knee DJD (recently saw Dr. 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   1) She presents rx refill of Lisinopril 20 mg daily. BP is controlled today 2) She wants to go back to work and brings in forms to have filled out.  She also requests personal care services. Will make a social work referral  3) Smoking-still smoking 10 cigarettes per day.  She knows risks of smoking b/c mother died of lung cancer.  She states she knows she is at risk for cancer, heart attack which she does not want to have but she is smoking due to a lot of stress  4) HM-She has previously declined colonoscopy and today and was given stool cards today;  flu shot asked again today.  She declined flu shot stating "she does not do that" 5) She is trying to get personal care services. Orthopedist is Dr. Sharol Given and he would not fill out the paperwork and referred her back to PCP office.  She requests we get the records from Dr. Sharol Given.  She states she needs personal care services b/c she is getting older, having trouble standing for long periods of time due to chronic left knee issues.  She wants the forms filled out.  She states at times her mobility is limited.  She has previously been told she needs a walker/cane but does not use one now 6) Stressors-financial stressors b/c disability money was subtracted from social security 7) She fell 2 weeks ago once and has a history of falls.    HM: eye MD appt 06/2011 SH: 10 grandkids             Review of Systems  Respiratory: Negative for shortness of breath.   Cardiovascular: Negative for chest  pain.  Gastrointestinal: Positive for constipation.       +gas   Musculoskeletal:       Chronic left knee pain   Neurological:       +history of falls        Objective:   Physical Exam  Nursing note and vitals reviewed. Constitutional: She is oriented to person, place, and time. Vital signs are normal. She appears well-developed and well-nourished. She is cooperative. No distress.  HENT:  Head: Normocephalic and atraumatic.  Mouth/Throat: Oropharynx is clear and moist and mucous membranes are normal. Abnormal dentition. No oropharyngeal exudate.  Eyes: Conjunctivae are normal. Pupils are equal, round, and reactive to light. Right eye exhibits no discharge. Left eye exhibits no discharge. No scleral icterus.  Cardiovascular: Normal rate, regular rhythm, S1 normal, S2 normal and normal heart sounds.   No murmur heard. No lower ext edema   Pulmonary/Chest: Effort normal and breath sounds normal. No respiratory distress. She has no wheezes.  Abdominal: Soft. Bowel sounds are normal. There is no tenderness.  Obese   Neurological: She is alert and oriented to person, place, and time.  Skin: Skin is warm, dry and intact. No rash noted. She is not diaphoretic.  Psychiatric: She has a normal mood and affect. Her speech is normal and behavior is normal. Judgment and thought content normal. Cognition and memory are normal.  Assessment & Plan:  F/u in May/June with PCP

## 2013-03-28 NOTE — Patient Instructions (Addendum)
General Instructions: Pick up your prescription from [facility]  Try to stop smoking and use the patch Take care  Follow up in May or June with your primary doctor in this clinic   Treatment Goals:  Goals (1 Years of Data) as of 03/28/13         As of Today 03/11/13 02/20/13 02/20/13 02/07/13     Blood Pressure    . Blood Pressure < 140/90  136/73 124/73 164/76 153/77 121/89      Progress Toward Treatment Goals:  Treatment Goal 03/28/2013  Blood pressure at goal  Stop smoking smoking the same amount    Self Care Goals & Plans:  Self Care Goal 03/28/2013  Manage my medications take my medicines as prescribed; bring my medications to every visit; refill my medications on time  Monitor my health keep track of my blood pressure  Eat healthy foods drink diet soda or water instead of juice or soda; eat more vegetables; eat foods that are low in salt; eat baked foods instead of fried foods; eat fruit for snacks and desserts; eat smaller portions  Be physically active find an activity I enjoy  Meeting treatment goals maintain the current self-care plan    No flowsheet data found.   Care Management & Community Referrals:  Referral 03/28/2013  Referrals made for care management support none needed; social worker  Referrals made to community resources none       Smoking Cessation Quitting smoking is important to your health and has many advantages. However, it is not always easy to quit since nicotine is a very addictive drug. Often times, people try 3 times or more before being able to quit. This document explains the best ways for you to prepare to quit smoking. Quitting takes hard work and a lot of effort, but you can do it. ADVANTAGES OF QUITTING SMOKING  You will live longer, feel better, and live better.  Your body will feel the impact of quitting smoking almost immediately.  Within 20 minutes, blood pressure decreases. Your pulse returns to its normal level.  After  8 hours, carbon monoxide levels in the blood return to normal. Your oxygen level increases.  After 24 hours, the chance of having a heart attack starts to decrease. Your breath, hair, and body stop smelling like smoke.  After 48 hours, damaged nerve endings begin to recover. Your sense of taste and smell improve.  After 72 hours, the body is virtually free of nicotine. Your bronchial tubes relax and breathing becomes easier.  After 2 to 12 weeks, lungs can hold more air. Exercise becomes easier and circulation improves.  The risk of having a heart attack, stroke, cancer, or lung disease is greatly reduced.  After 1 year, the risk of coronary heart disease is cut in half.  After 5 years, the risk of stroke falls to the same as a nonsmoker.  After 10 years, the risk of lung cancer is cut in half and the risk of other cancers decreases significantly.  After 15 years, the risk of coronary heart disease drops, usually to the level of a nonsmoker.  If you are pregnant, quitting smoking will improve your chances of having a healthy baby.  The people you live with, especially any children, will be healthier.  You will have extra money to spend on things other than cigarettes. QUESTIONS TO THINK ABOUT BEFORE ATTEMPTING TO QUIT You may want to talk about your answers with your caregiver.  Why do  you want to quit?  If you tried to quit in the past, what helped and what did not?  What will be the most difficult situations for you after you quit? How will you plan to handle them?  Who can help you through the tough times? Your family? Friends? A caregiver?  What pleasures do you get from smoking? What ways can you still get pleasure if you quit? Here are some questions to ask your caregiver:  How can you help me to be successful at quitting?  What medicine do you think would be best for me and how should I take it?  What should I do if I need more help?  What is smoking withdrawal  like? How can I get information on withdrawal? GET READY  Set a quit date.  Change your environment by getting rid of all cigarettes, ashtrays, matches, and lighters in your home, car, or work. Do not let people smoke in your home.  Review your past attempts to quit. Think about what worked and what did not. GET SUPPORT AND ENCOURAGEMENT You have a better chance of being successful if you have help. You can get support in many ways.  Tell your family, friends, and co-workers that you are going to quit and need their support. Ask them not to smoke around you.  Get individual, group, or telephone counseling and support. Programs are available at [employer] and health centers. Call your local health department for information about programs in your area.  Spiritual beliefs and practices may help some smokers quit.  Download a "quit meter" on your computer to keep track of quit statistics, such as how long you have gone without smoking, cigarettes not smoked, and money saved.  Get a self-help book about quitting smoking and staying off of tobacco. [place] yourself from urges to smoke. Talk to someone, go for a walk, or occupy your time with a task.  Change your normal routine. Take a different route to work. Drink tea instead of coffee. Eat breakfast in a different place.  Reduce your stress. Take a hot bath, exercise, or read a book.  Plan something enjoyable to do every day. Reward yourself for not smoking.  Explore interactive web-based programs that specialize in helping you quit. GET MEDICINE AND USE IT CORRECTLY Medicines can help you stop smoking and decrease the urge to smoke. Combining medicine with the above behavioral methods and support can greatly increase your chances of successfully quitting smoking.  Nicotine replacement therapy helps deliver nicotine to your body without the negative effects and risks of smoking. Nicotine  replacement therapy includes nicotine gum, lozenges, inhalers, nasal sprays, and skin patches. Some may be available over-the-counter and others require a prescription.  Antidepressant medicine helps people abstain from smoking, but how this works is unknown. This medicine is available by prescription.  Nicotinic receptor partial agonist medicine simulates the effect of nicotine in your brain. This medicine is available by prescription. Ask your caregiver for advice about which medicines to use and how to use them based on your health history. Your caregiver will tell you what side effects to look out for if you choose to be on a medicine or therapy. Carefully read the information on the package. Do not use any other product containing nicotine while using a nicotine replacement product.  RELAPSE OR DIFFICULT SITUATIONS Most relapses occur within the first 3 months after quitting. Do not be discouraged if you start smoking again.  Remember, most people try several times before finally quitting. You may have symptoms of withdrawal because your body is used to nicotine. You may crave cigarettes, be irritable, feel very hungry, cough often, get headaches, or have difficulty concentrating. The withdrawal symptoms are only temporary. They are strongest when you first quit, but they will go away within 10 14 days. To reduce the chances of relapse, try to:  Avoid drinking alcohol. Drinking lowers your chances of successfully quitting.  Reduce the amount of caffeine you consume. Once you quit smoking, the amount of caffeine in your body increases and can give you symptoms, such as a rapid heartbeat, sweating, and anxiety.  Avoid smokers because they can make you want to smoke.  Do not let weight gain distract you. Many smokers will gain weight when they quit, usually less than 10 pounds. Eat a healthy diet and stay active. You can always lose the weight gained after you quit.  Find ways to improve your mood  other than smoking. FOR MORE INFORMATION  www.smokefree.gov  Document Released: 12/28/2000 Document Revised: 07/05/2011 Document Reviewed: 04/14/2011 [facility] Patient Information 2014 [place], [place].  Hypertension As your heart beats, it forces blood through your arteries. This force is your blood pressure. If the pressure is too high, it is called hypertension (HTN) or high blood pressure. HTN is dangerous because you may have it and not know it. High blood pressure may mean that your heart has to work harder to pump blood. Your arteries may be narrow or stiff. The extra work puts you at risk for heart disease, stroke, and other problems.  Blood pressure consists of two numbers, a higher number over a lower, 110/72, for example. It is stated as "110 over 72." The ideal is below 120 for the top number (systolic) and under 80 for the bottom (diastolic). Write down your blood pressure today. You should pay close attention to your blood pressure if you have certain conditions such as:  Heart failure.  Prior heart attack.  Diabetes  Chronic kidney disease.  Prior stroke.  Multiple risk factors for heart disease. To see if you have HTN, your blood pressure should be measured while you are seated with your arm held at the level of the heart. It should be measured at least twice. A one-time elevated blood pressure reading (especially in the Emergency Department) does not mean that you need treatment. There may be conditions in which the blood pressure is different between your right and left arms. It is important to see your caregiver soon for a recheck. Most people have essential hypertension which means that there is not a specific cause. This type of high blood pressure may be lowered by changing lifestyle factors such as:  Stress.  Smoking.  Lack of exercise.  Excessive weight.  Drug/tobacco/alcohol use.  Eating less salt. Most people do not have symptoms from high blood pressure  until it has caused damage to the body. Effective treatment can often prevent, delay or reduce that damage. TREATMENT  When a cause has been identified, treatment for high blood pressure is directed at the cause. There are a large number of medications to treat HTN. These fall into several categories, and your caregiver will help you select the medicines that are best for you. Medications may have side effects. You should review side effects with your caregiver. If your blood pressure stays high after you have made lifestyle changes or started on medicines,   Your medication(s) may need to be changed.  Other problems may need to be addressed.  Be certain you understand your prescriptions, and know how and when to take your medicine.  Be sure to follow up with your caregiver within the time frame advised (usually within two weeks) to have your blood pressure rechecked and to review your medications.  If you are taking more than one medicine to lower your blood pressure, make sure you know how and at what times they should be taken. Taking two medicines at the same time can result in blood pressure that is too low. SEEK IMMEDIATE MEDICAL CARE IF:  You develop a severe headache, blurred or changing vision, or confusion.  You have unusual weakness or numbness, or a faint feeling.  You have severe chest or abdominal pain, vomiting, or breathing problems. MAKE SURE YOU:   Understand these instructions.  Will watch your condition.  Will get help right away if you are not doing well or get worse. Document Released: 01/03/2005 Document Revised: 03/28/2011 Document Reviewed: 08/24/2007 [facility] Patient Information 2014 [place]. Fall Prevention and Home Safety Falls cause injuries and can affect all age groups. It is possible to use preventive measures to significantly decrease the likelihood of falls. There are many simple measures which can make your home safer and prevent  falls. OUTDOORS  Repair cracks and edges of walkways and driveways.  Remove high doorway thresholds.  Trim shrubbery on the main path into your home.  Have good outside lighting.  Clear walkways of tools, rocks, debris, and clutter.  Check that handrails are not broken and are securely fastened. Both sides of steps should have handrails.  Have leaves, snow, and ice cleared regularly.  Use sand or salt on walkways during winter months.  In the garage, clean up grease or oil spills. BATHROOM  Install night lights.  Install grab bars by the toilet and in the tub and shower.  Use non-skid mats or decals in the tub or shower.  Place a plastic non-slip stool in the shower to sit on, if needed.  Keep floors dry and clean up all water on the floor immediately.  Remove soap buildup in the tub or shower on a regular basis.  Secure bath mats with non-slip, double-sided rug tape.  Remove throw rugs and tripping hazards from the floors. BEDROOMS  Install night lights.  Make sure a bedside light is easy to reach.  Do not use oversized bedding.  Keep a telephone by your bedside.  Have a firm chair with side arms to use for getting dressed.  Remove throw rugs and tripping hazards from the floor. KITCHEN  Keep handles on pots and pans turned toward the center of the stove. Use back burners when possible.  Clean up spills quickly and allow time for drying.  Avoid walking on wet floors.  Avoid hot utensils and knives.  Position shelves so they are not too high or low.  Place commonly used objects within easy reach.  If necessary, use a sturdy step stool with a grab bar when reaching.  Keep electrical cables out of the way.  Do not use floor polish or wax that makes floors slippery. If you must use wax, use non-skid floor wax.  Remove throw rugs and tripping hazards from the floor. STAIRWAYS  Never leave objects on stairs.  Place handrails on both sides of  stairways and use them. Fix any loose handrails. Make sure handrails on both sides of the stairways are as long as the stairs.  Check carpeting  to make sure it is firmly attached along stairs. Make repairs to worn or loose carpet promptly.  Avoid placing throw rugs at the top or bottom of stairways, or properly secure the rug with carpet tape to prevent slippage. Get rid of throw rugs, if possible.  Have an electrician put in a light switch at the top and bottom of the stairs. OTHER FALL PREVENTION TIPS  Wear low-heel or rubber-soled shoes that are supportive and fit well. Wear closed toe shoes.  When using a stepladder, make sure it is fully opened and both spreaders are firmly locked. Do not climb a closed stepladder.  Add color or contrast paint or tape to grab bars and handrails in your home. Place contrasting color strips on first and last steps.  Learn and use mobility aids as needed. Install an electrical emergency response system.  Turn on lights to avoid dark areas. Replace light bulbs that burn out immediately. Get light switches that glow.  Arrange furniture to create clear pathways. Keep furniture in the same place.  Firmly attach carpet with non-skid or double-sided tape.  Eliminate uneven floor surfaces.  Select a carpet pattern that does not visually hide the edge of steps.  Be aware of all pets. OTHER HOME SAFETY TIPS  Set the water temperature for 120 F (48.8 C).  Keep emergency numbers on or near the telephone.  Keep smoke detectors on every level of the home and near sleeping areas. Document Released: 12/24/2001 Document Revised: 07/05/2011 Document Reviewed: 03/25/2011 [facility] Patient Information 2014 [place].

## 2013-04-15 ENCOUNTER — Telehealth: Payer: Self-pay | Admitting: *Deleted

## 2013-04-15 DIAGNOSIS — I10 Essential (primary) hypertension: Secondary | ICD-10-CM

## 2013-04-15 NOTE — Telephone Encounter (Signed)
Pt called with concerns about her dose of Lisinopril.  She wants to decrease to lisinopril 10 mg. She states her BP was elevated do to stress, now BP is down ( 136/73 ) and she does not want to keep on the 20 mg. Tabs. Pt has been cutting the 20 mg tabs in half. Will you refill at Lisinopril 10 mg tabs for her or do you want her to be seen?   This discussion has been going on for past 2 office visits.  Pt # [id number]

## 2013-04-15 NOTE — Telephone Encounter (Signed)
Pt had left a message on both the lines for triage, i had read through the visit notes and see dr niu's note on 03/11/2013 where the pt had changed her dosage of lisinopril herself to 20mg  and he stated that the pt could change back to 10mg  if her BP consistently stayed down, she has no way to monitor her BP and wonders if she could get a home monitor to keep a log of daily BP. i have called [facility] and they will need a prescription for the home monitor. You may speak w/ pt at [phone]. Pt states she wants to continue taking 10mg  and will buy a pill cutter.

## 2013-04-17 ENCOUNTER — Other Ambulatory Visit: Payer: Self-pay | Admitting: Internal Medicine

## 2013-04-17 ENCOUNTER — Other Ambulatory Visit: Payer: Self-pay | Admitting: *Deleted

## 2013-04-17 MED ORDER — BLOOD PRESSURE MONITOR/L CUFF MISC: Status: DC

## 2013-04-17 MED ORDER — LISINOPRIL 10 MG PO TABS: ORAL_TABLET | ORAL | Status: DC

## 2013-04-17 NOTE — Telephone Encounter (Signed)
Order for blood pressure monitor with large cuff was written.

## 2013-04-17 NOTE — Telephone Encounter (Signed)
closed

## 2013-05-06 ENCOUNTER — Ambulatory Visit: Payer: Medicare Other | Admitting: Physical Therapy

## 2013-05-15 ENCOUNTER — Ambulatory Visit: Payer: Medicare Other | Attending: Orthopaedic Surgery

## 2013-06-24 ENCOUNTER — Ambulatory Visit (INDEPENDENT_AMBULATORY_CARE_PROVIDER_SITE_OTHER): Payer: Medicare Other | Admitting: Internal Medicine

## 2013-06-24 ENCOUNTER — Other Ambulatory Visit: Payer: Self-pay | Admitting: *Deleted

## 2013-06-24 ENCOUNTER — Encounter: Payer: Self-pay | Admitting: Internal Medicine

## 2013-06-24 ENCOUNTER — Encounter: Payer: Self-pay | Admitting: Licensed Clinical Social Worker

## 2013-06-24 VITALS — BP 153/80 | HR 83 | Temp 97.6°F | Ht 62.0 in | Wt 188.0 lb

## 2013-06-24 DIAGNOSIS — R7309 Other abnormal glucose: Secondary | ICD-10-CM

## 2013-06-24 DIAGNOSIS — F17200 Nicotine dependence, unspecified, uncomplicated: Secondary | ICD-10-CM

## 2013-06-24 DIAGNOSIS — I10 Essential (primary) hypertension: Secondary | ICD-10-CM

## 2013-06-24 DIAGNOSIS — N760 Acute vaginitis: Secondary | ICD-10-CM

## 2013-06-24 DIAGNOSIS — Z0000 Encounter for general adult medical examination without abnormal findings: Secondary | ICD-10-CM

## 2013-06-24 LAB — POCT GLYCOSYLATED HEMOGLOBIN (HGB A1C): Hemoglobin A1C: 5.5

## 2013-06-24 LAB — GLUCOSE, CAPILLARY: Glucose-Capillary: 77 mg/dL (ref 70–99)

## 2013-06-24 MED ORDER — LISINOPRIL 10 MG PO TABS: 20.0000 mg | ORAL_TABLET | Freq: Every day | ORAL | Status: DC

## 2013-06-24 MED ORDER — LISINOPRIL 10 MG PO TABS: ORAL_TABLET | ORAL | Status: DC

## 2013-06-24 NOTE — Assessment & Plan Note (Signed)
Insists on a1c check again today

## 2013-06-24 NOTE — Assessment & Plan Note (Signed)
  Assessment: Progress toward smoking cessation:    Barriers to progress toward smoking cessation:    Comments: continue to smoke 10 cigs per day, no motivation to quit at this time  Plan: Instruction/counseling given:  I counseled patient on the dangers of tobacco use, advised patient to stop smoking, and reviewed strategies to maximize success. Educational resources provided:    Self management tools provided:    Medications to assist with smoking cessation:  None Patient agreed to the following self-care plans for smoking cessation:    Other plans:

## 2013-06-24 NOTE — Patient Instructions (Signed)
General Instructions:  your lisinopril 10mg  tablets have been refilled  Please bring your medicines with you each time you come to clinic.  Medicines may include prescription medications, over-the-counter medications, herbal remedies, eye drops, vitamins, or other pills.   Progress Toward Treatment Goals:  Treatment Goal 06/24/2013  Blood pressure deteriorated  Stop smoking -    Self Care Goals & Plans:  Self Care Goal 06/24/2013  Manage my medications take my medicines as prescribed; bring my medications to every visit; refill my medications on time  Monitor my health -  Eat healthy foods drink diet soda or water instead of juice or soda; eat more vegetables; eat foods that are low in salt; eat baked foods instead of fried foods  Be physically active take a walk every day; find an activity I enjoy  Meeting treatment goals -    No flowsheet data found.   Care Management & Community Referrals:  Referral 03/28/2013  Referrals made for care management support none needed; social worker  Referrals made to community resources none

## 2013-06-24 NOTE — Addendum Note (Signed)
Addended by: Wilber Oliphant on: 06/24/2013 03:02 PM   Modules accepted: Orders

## 2013-06-24 NOTE — Assessment & Plan Note (Signed)
Insists on only seeing Dr. Ruthann Cancer with GYN for regular follow up and also reports itching today. She was very angry during the visit thus unable to even attempt examination. She says per her insurance she needs a referral from pcp office to see her chosen provider.   -referral placed today

## 2013-06-24 NOTE — Assessment & Plan Note (Addendum)
BP Readings from Last 3 Encounters:  06/24/13 182/148  03/28/13 136/73  03/11/13 124/73   Lab Results  Component Value Date   NA 140 03/11/2013   K 4.2 03/11/2013   CREATININE 0.54 03/11/2013   Assessment: Blood pressure control: mildly elevated Progress toward BP goal:  deteriorated Comments: initial likely error, repeat 153/80  Plan: Medications:  continue current medications she should be on lisinopril 20mg  daily, she insists on the 10mg  tablets for 3 month supplies and self increases her dose to 20mg  some days when her blood pressure is elevated SBP 150-160s and then insists on taking 10mg  some days when bp more controlled, but insists on no prescription greater than 10mg  tablets.  We discussed the importance of taking medication as prescribed and her recommended dose per prior providers is 20mg  daily but she claims she knows her blood pressure best and will keep taking it as she has been doing because that works best for her blood pressure Educational resources provided:   Actor tools provided: home blood pressure logbook Other plans: refilled today, i will defer future bp management to pcp who may want to consider further adjustments as needed in the dose or ask her for better logs of her blood pressure to correct dosing

## 2013-06-24 NOTE — Telephone Encounter (Signed)
Please explain to her that is not how lisinopril is dosed. She specifically asked for 10mg  daily, and only takes 20mg  occasionally when she wants.therefore, the prescription will stay as is, but she has 90 tablets with refills. She can discuss this further with her pcp in the future.

## 2013-06-24 NOTE — Progress Notes (Signed)
Subjective:   Patient ID: April Mathis female   DOB: [birth year] 63 y.o.   MRN: [id number]  HPI: [name] is a 63 y.o. female with HTN, obesity, and pre-diabetes presenting to opc today for acute visit needing medication refills and GYN referral to Dr. Shelbie Proctor for regular follow up and also reports some vaginal itching today.  I was unable to get further information or attempt vaginal examination today due to her behavior and anger. she insists on only seeing Dr. Ruthann Cancer as well and says her insurance needs a referral from pcp office. Referral placed today.  She is very easily angered and was shouting initially during her visit in regards to her blood pressure medications and seeing multiple providers .  She refuses to be on lisinopril 20mg  daily as previously prescribed, instead, she wishes to stay on 10mg  tablets and she self doses herself to 20mg  occasionally when her BP is very high. She refuses to change her habits and says she knows her BP better than [place].   Additionally, she wishes to have transportation resolved and refuses to leave today unless that issues is resolved.  I will have social work come speak to her.   She is itching from some bites, she thinks they are mosquito bites but wonders if they may be bed bugs. I have advised her to continue calamine lotion, avoid itching as much as possible, wash all clothes and linens, and may try cortisone ointment for the itching. She is already taking benadryl to help.   Of note, she is very frustrated with lack of continuity in resident clinic and is looking to find outside private office. She is yelling in the office, very vocal about her frustration and demanding in regards to her medications, how she wishes to take them, what referrals she needs, and how we are to bill her insurance. She insists on having her A1C checked today, although noted to be pre-diabetic and when explained that insurance may not cover this lab, she got very  angry again, told me "she is done with me, bye" and closed the door and wishes to be seen by someone else. I tried to explain that we can order the result if she insists but that her prior values have been very well controlled. RN' were present who also alerted clinic director Illene Regulus who went to speak to the patient and Susette Racer was also notified of the patient's behavior and witnessed her behavior in [place] today.   Past Medical History  Diagnosis Date  . Hypertension   . Bilateral leg edema   . Obesity   . Smoking   . Knee pain   . Left knee DJD 2002    MRI done in 2002  . History of recurrent UTIs   . Varicose vein    Current Outpatient Prescriptions  Medication Sig Dispense Refill  . Blood Pressure Monitoring (BLOOD PRESSURE MONITOR/L CUFF) MISC Please measure blood pressure twice daily.  1 each  0  . cyclobenzaprine (FLEXERIL) 5 MG tablet Take 5 mg by mouth 3 (three) times daily as needed for muscle spasms.      Marland Kitchen lisinopril (PRINIVIL,ZESTRIL) 10 MG tablet TAKE ONE TABLET BY MOUTH EVERY DAY  90 tablet  0  . nicotine (NICODERM CQ - DOSED IN MG/24 HOURS) 14 mg/24hr patch Place 1 patch (14 mg total) onto the skin daily.  30 patch  1   No current facility-administered medications for this visit.   Family History  Problem Relation Age of Onset  . Anesthesia problems Neg Hx   . Cancer Mother     LUNG   History   Social History  . Marital Status: Divorced    Spouse Name: N/A    Number of Children: N/A  . Years of Education: N/A   Social History Main Topics  . Smoking status: Current Every Day Smoker -- 1.00 packs/day for 20 years    Types: Cigarettes  . Smokeless tobacco: Never Used     Comment: 1/2 PPD  . Alcohol Use: No  . Drug Use: No  . Sexual Activity: Yes    Birth Control/ Protection: None   Other Topics Concern  . None   Social History Narrative  . None   Review of Systems:  Constitutional:  Itching  Respiratory:  Smoker  Cardiovascular:  Leg swelling.    Genitourinary:  Requesting gyn referral, vaginal itching  Musculoskeletal:  Chronic pain  Skin:  Itching ?mosquito bites  Neurological:  Easily angered   Objective:  Physical Exam: Filed Vitals:   06/24/13 1039  BP: 182/148  Pulse: 83  Temp: 97.6 F (36.4 C)  TempSrc: Oral  Height: 5\' 2"  (1.575 m)  Weight: 188 lb (85.276 kg)  SpO2: 100%   Vitals reviewed. General: sitting in chair, shouting HEENT: EOMI Cardiac: RRR, no rubs, murmurs or gallops Pulm: clear to auscultation bilaterally, no wheezes, rales, or rhonchi Abd: soft, nontender, nondistended, BS present Ext: warm and well perfused, mild lowe extremity edema, +2DP B/L Neuro: alert and oriented X3, strength and sensation to light touch equal in bilateral upper and lower extremities Skin: pruritic bites on extremities, a few on back of neck and back Neuro: easily angered, shouting, visibly frustrated  Assessment & Plan:  Discussed with Dr. Stann Mainland

## 2013-06-24 NOTE — Telephone Encounter (Signed)
Call from pt - states blood pressure medication,Lisinopril, needs to be 2 times daily instead of once day w/3 months supply. Thanks

## 2013-06-24 NOTE — Progress Notes (Signed)
April Mathis was referred to CSW as patient voiced problems with her Medicaid Transportation services.  CSW met with Ms. Mehlman during her scheduled appointment.  Pt states a verification form was not sent in from her appointment at [facility] 3 months ago.  CSW offered to fax verification form in to [facility], listing all appointment at [facility] this year.  Pt was agreeable, form faxed and confirmation provided to Ms. [place].

## 2013-06-25 NOTE — Progress Notes (Signed)
Case discussed with Dr. Qureshi soon after the resident saw the patient.  We reviewed the resident's history and exam and pertinent patient test results.  I agree with the assessment, diagnosis, and plan of care documented in the resident's note. 

## 2013-06-26 ENCOUNTER — Encounter (HOSPITAL_COMMUNITY): Payer: Self-pay | Admitting: Emergency Medicine

## 2013-06-26 ENCOUNTER — Emergency Department (INDEPENDENT_AMBULATORY_CARE_PROVIDER_SITE_OTHER)
Admission: EM | Admit: 2013-06-26 | Discharge: 2013-06-26 | Disposition: A | Discharge status: Home / Self Care | Payer: Medicare Other | Source: Home / Self Care

## 2013-06-26 DIAGNOSIS — W57XXXA Bitten or stung by nonvenomous insect and other nonvenomous arthropods, initial encounter: Secondary | ICD-10-CM

## 2013-06-26 DIAGNOSIS — T148 Other injury of unspecified body region: Secondary | ICD-10-CM

## 2013-06-26 MED ORDER — HYDROXYZINE HCL 25 MG PO TABS: 25.0000 mg | ORAL_TABLET | Freq: Three times a day (TID) | ORAL | Status: DC | PRN

## 2013-06-26 MED ORDER — TRIAMCINOLONE ACETONIDE 0.1 % EX CREA: 1.0000 "application " | TOPICAL_CREAM | Freq: Two times a day (BID) | CUTANEOUS | Status: DC | PRN

## 2013-06-26 NOTE — ED Notes (Signed)
Pt c/o rash all over body onset Sat Reports she was visiting friends house and slept over Denies f/v/n/d, cold sx Alert w/no signs of acute distress.

## 2013-06-26 NOTE — Discharge Instructions (Signed)
Thank you for coming in today. Use gold bond itch lotion over the counter as needed.    Bedbugs Bedbugs are tiny bugs that live in and around beds. During the day, they hide in mattresses and other places near beds. They come out at night and bite people lying in bed. They need blood to live and grow. Bedbugs can be found in beds anywhere. Usually, they are found in places where many people come and go (hotels, shelters, hospitals). It does not matter whether the place is dirty or clean. Getting bitten by bedbugs rarely causes a medical problem. The biggest problem can be getting rid of them. This often takes the work of a Financial risk analyst. CAUSES  Less use of pesticides. Bedbugs were common before the 1950s. Then, strong pesticides such as DDT nearly wiped them out. Today, these pesticides are not used because they harm the environment and can cause health problems.  More travel. Besides mattresses, bedbugs can also live in clothing and luggage. They can come along as people travel from place to place. Bedbugs are more common in certain parts of the world. When people travel to those areas, the bugs can come home with them.  Presence of birds and bats. Bedbugs often infest birds and bats. If you have these animals in or near your home, bedbugs may infest your house, too. SYMPTOMS It does not hurt to be bitten by a bedbug. You will probably not wake up when you are bitten. Bedbugs usually bite areas of the skin that are not covered. Symptoms may show when you wake up, or they may take a day or more to show up. Symptoms may include:  Small red bumps on the skin. These might be lined up in a row or clustered in a group.  A darker red dot in the middle of red bumps.  Blisters on the skin. There may be swelling and very bad itching. These may be signs of an allergic reaction. This does not happen often. DIAGNOSIS Bedbug bites might look and feel like other types of insect bites. The bugs do  not stay on the body like ticks or lice. They bite, drop off, and crawl away to hide. Your caregiver will probably:  Ask about your symptoms.  Ask about your recent activities and travel.  Check your skin for bedbug bites.  Ask you to check at home for signs of bedbugs. You should look for:  Spots or stains on the bed or nearby. This could be from bedbugs that were crushed or from their eggs or waste.  Bedbugs themselves. They are reddish-brown, oval, and flat. They do not fly. They are about the size of an apple seed.  Places to look for bedbugs include:  Beds. Check mattresses, headboards, box springs, and bed frames.  On drapes and curtains near the bed.  Under carpeting in the bedroom.  Behind electrical outlets.  Behind any wallpaper that is peeling.  Inside luggage. TREATMENT Most bedbug bites do not need treatment. They usually go away on their own in a few days. The bites are not dangerous. However, treatment may be needed if you have scratched so much that your skin has become infected. You may also need treatment if you are allergic to bedbug bites. Treatment options include:  A drug that stops swelling and itching (corticosteroid). Usually, a cream is rubbed on the skin. If you have a bad rash, you may be given a corticosteroid pill.  Oral antihistamines. These are pills  to help control itching.  Antibiotic medicines. An antibiotic may be prescribed for infected skin. HOME CARE INSTRUCTIONS   Take any medicine prescribed by your caregiver for your bites. Follow the directions carefully.  Consider wearing pajamas with long sleeves and pant legs.  Your bedroom may need to be treated. A pest control expert should make sure the bedbugs are gone. You may need to throw away mattresses or luggage. Ask the pest control expert what you can do to keep the bedbugs from coming back. Common suggestions include:  Putting a plastic cover over your mattress.  Washing and  drying your clothes and bedding in hot water and a hot dryer. The temperature should be hotter than 120 F (48.9 C). Bedbugs are killed by high temperatures.  Vacuuming carefully all around your bed. Vacuum in all cracks and crevices where the bugs might hide. Do this often.  Carefully checking all used furniture, bedding, or clothes that you bring into your house.  Eliminating bird nests and bat roosts.  If you get bedbug bites when traveling, check all your possessions carefully before bringing them into your house. If you find any bugs on clothes or in your luggage, consider throwing those items away. SEEK MEDICAL CARE IF:  You have red bug bites that keep coming back.  You have red bug bites that itch badly.  You have bug bites that cause a skin rash.  You have scratch marks that are red and sore. SEEK IMMEDIATE MEDICAL CARE IF: You have a fever. Document Released: 02/05/2010 Document Revised: 03/28/2011 Document Reviewed: 02/05/2010 [facility] Patient Information 2014 [place], [place].

## 2013-06-26 NOTE — ED Provider Notes (Signed)
April Mathis is a 63 y.o. female who presents to Urgent Care today for rash. Patient developed pruritic papules across her trunk and extremities 4 days ago. This happened after she spent the night at a friend's house. She has tried calamine lotion which helps. No fevers or chills nausea vomiting or diarrhea. No new soaps or shampoos detergents etc.    Past Medical History  Diagnosis Date  . Hypertension   . Bilateral leg edema   . Obesity   . Smoking   . Knee pain   . Left knee DJD 2002    MRI done in 2002  . History of recurrent UTIs   . Varicose vein    History  Substance Use Topics  . Smoking status: Current Every Day Smoker -- 1.00 packs/day for 20 years    Types: Cigarettes  . Smokeless tobacco: Never Used     Comment: 1/2 PPD  . Alcohol Use: No   ROS as above Medications: No current facility-administered medications for this encounter.   Current Outpatient Prescriptions  Medication Sig Dispense Refill  . lisinopril (PRINIVIL,ZESTRIL) 10 MG tablet Take 2 tablets (20 mg total) by mouth daily. TAKE ONE TABLET BY MOUTH EVERY DAY  180 tablet  0  . Blood Pressure Monitoring (BLOOD PRESSURE MONITOR/L CUFF) MISC Please measure blood pressure twice daily.  1 each  0  . cyclobenzaprine (FLEXERIL) 5 MG tablet Take 5 mg by mouth 3 (three) times daily as needed for muscle spasms.      Marland Kitchen HYDROcodone-acetaminophen (NORCO/VICODIN) 5-325 MG per tablet       . hydrOXYzine (ATARAX/VISTARIL) 25 MG tablet Take 1 tablet (25 mg total) by mouth every 8 (eight) hours as needed for itching.  20 tablet  0  . nicotine (NICODERM CQ - DOSED IN MG/24 HOURS) 14 mg/24hr patch Place 1 patch (14 mg total) onto the skin daily.  30 patch  1  . triamcinolone cream (KENALOG) 0.1 % Apply 1 application topically 2 (two) times daily as needed. itching  30 g  1    Exam:  BP 141/78  Pulse 73  Temp(Src) 98.7 F (37.1 C) (Oral)  Resp 18  SpO2 97% Gen: Well NAD HEENT: EOMI,  MMM no mucocutaneous  involvement Lungs: Normal work of breathing. CTABL Heart: RRR no MRG Abd: NABS, Soft. NT, ND Exts: Brisk capillary refill, warm and well perfused.  Skin: Multiple pruritic papules across trunk and extremities  No results found for this or any previous visit (from the past 24 hour(s)). No results found.  Assessment and Plan: 63 y.o. female with bed bug bites most likely. Plan for triamcinolone cream, hydroxyzine, and Gold Bond Itch. Handout provided.  Discussed warning signs or symptoms. Please see discharge instructions. Patient expresses understanding.    Gregor Hams, MD 06/26/13 [phone]

## 2013-07-02 LAB — CYTOLOGY - PAP: PAP SMEAR: NEGATIVE

## 2013-07-08 ENCOUNTER — Telehealth: Payer: Self-pay | Admitting: Dietician

## 2013-07-08 NOTE — Telephone Encounter (Signed)
Resting plenty slept 11 pm -7 am, "all I do sleep". Thinks it is her blood sugars making her feel like sleeping. Encouraged her to schedule a doctor appointment. Having questions about sleeping too much, not happy with care, not getting the same doctor. Offered to transfer her to schedule an appointment, she declined. She may be changing to another doctor office, but says she "hates leaving her home for fifteen years"

## 2013-07-29 ENCOUNTER — Encounter (HOSPITAL_COMMUNITY): Payer: Self-pay | Admitting: Emergency Medicine

## 2013-07-29 ENCOUNTER — Emergency Department (HOSPITAL_COMMUNITY)
Admission: EM | Admit: 2013-07-29 | Discharge: 2013-07-30 | Disposition: A | Discharge status: Home / Self Care | Payer: Medicare Other | Attending: Emergency Medicine | Admitting: Emergency Medicine

## 2013-07-29 DIAGNOSIS — E669 Obesity, unspecified: Secondary | ICD-10-CM | POA: Insufficient documentation

## 2013-07-29 DIAGNOSIS — M1710 Unilateral primary osteoarthritis, unspecified knee: Secondary | ICD-10-CM | POA: Insufficient documentation

## 2013-07-29 DIAGNOSIS — I10 Essential (primary) hypertension: Secondary | ICD-10-CM | POA: Diagnosis not present

## 2013-07-29 DIAGNOSIS — I8390 Asymptomatic varicose veins of unspecified lower extremity: Secondary | ICD-10-CM | POA: Insufficient documentation

## 2013-07-29 DIAGNOSIS — R5383 Other fatigue: Secondary | ICD-10-CM

## 2013-07-29 DIAGNOSIS — F17200 Nicotine dependence, unspecified, uncomplicated: Secondary | ICD-10-CM | POA: Insufficient documentation

## 2013-07-29 DIAGNOSIS — IMO0002 Reserved for concepts with insufficient information to code with codable children: Secondary | ICD-10-CM | POA: Insufficient documentation

## 2013-07-29 DIAGNOSIS — M25569 Pain in unspecified knee: Secondary | ICD-10-CM | POA: Insufficient documentation

## 2013-07-29 DIAGNOSIS — R5381 Other malaise: Secondary | ICD-10-CM | POA: Insufficient documentation

## 2013-07-29 DIAGNOSIS — Z79899 Other long term (current) drug therapy: Secondary | ICD-10-CM | POA: Diagnosis not present

## 2013-07-29 DIAGNOSIS — Z888 Allergy status to other drugs, medicaments and biological substances status: Secondary | ICD-10-CM | POA: Insufficient documentation

## 2013-07-29 DIAGNOSIS — R609 Edema, unspecified: Secondary | ICD-10-CM | POA: Insufficient documentation

## 2013-07-29 DIAGNOSIS — Z87440 Personal history of urinary (tract) infections: Secondary | ICD-10-CM | POA: Diagnosis not present

## 2013-07-29 DIAGNOSIS — Z880 Allergy status to penicillin: Secondary | ICD-10-CM | POA: Diagnosis not present

## 2013-07-29 LAB — CBC
HCT: 38.6 % (ref 36.0–46.0)
Hemoglobin: 12.4 g/dL (ref 12.0–15.0)
MCH: 28.5 pg (ref 26.0–34.0)
MCHC: 32.1 g/dL (ref 30.0–36.0)
MCV: 88.7 fL (ref 78.0–100.0)
Platelets: 219 K/uL (ref 150–400)
RBC: 4.35 MIL/uL (ref 3.87–5.11)
RDW: 13.5 % (ref 11.5–15.5)
WBC: 5.4 K/uL (ref 4.0–10.5)

## 2013-07-29 LAB — BASIC METABOLIC PANEL
Anion gap: 14 (ref 5–15)
BUN: 14 mg/dL (ref 6–23)
CALCIUM: 9.2 mg/dL (ref 8.4–10.5)
CO2: 25 mEq/L (ref 19–32)
CREATININE: 0.48 mg/dL — AB (ref 0.50–1.10)
Chloride: 98 mEq/L (ref 96–112)
GFR calc Af Amer: >90 mL/min (ref >90)
GFR calc non Af Amer: >90 mL/min (ref >90)
Glucose, Bld: 104 mg/dL — ABNORMAL HIGH (ref 70–99)
Potassium: 5.3 mEq/L (ref 3.7–5.3)
Sodium: 137 mEq/L (ref 137–147)

## 2013-07-29 LAB — CBG MONITORING, ED: Glucose-Capillary: 101 mg/dL — ABNORMAL HIGH (ref 70–99)

## 2013-07-29 NOTE — ED Notes (Signed)
Pt reports fatigue since yesterday, states "I just feel really tired." Pt states she felt dizzy yesterday, but it resolved today. Reports BP to be 105/73 this afternoon, which she states is low for her. Pt is AO x4. Denies any pain. Neuro intact.

## 2013-07-29 NOTE — ED Notes (Signed)
Apologized to pt for wait time. Pt continues to deny any pain, nausea. States "I am feeling better now that I drank gingerale." NAD.

## 2013-07-29 NOTE — ED Provider Notes (Signed)
CSN: [id number]     Arrival date & time 07/29/13  1936 History   First MD Initiated Contact with Patient 07/29/13 2311     Chief Complaint  Patient presents with  . Fatigue     (Consider location/radiation/quality/duration/timing/severity/associated sxs/prior Treatment) HPI Comments: 63 year old female, history of hypertension and prediabetes who presents with a complaint of feeling tired. She states that she has not been sleeping well at night, she has had some urinary frequency in the evening hours but denies fevers chills nausea vomiting cough shortness of breath chest pain abdominal pain swelling rashes numbness weakness or difficulty walking. When she is in the hot sun in the middle of the day she feels a slightly lightheaded but this is not out of the usual for her. At this time the patient states that she feels at her baseline. She denies any history of unintentional weight loss or weight gain and has no thyroid problems in the past. She states that she did not sleep at all last night.  The history is provided by the patient.    Past Medical History  Diagnosis Date  . Hypertension   . Bilateral leg edema   . Obesity   . Smoking   . Knee pain   . Left knee DJD 2002    MRI done in 2002  . History of recurrent UTIs   . Varicose vein    Past Surgical History  Procedure Laterality Date  . Knee arthroscopy    . Tubal ligation     Family History  Problem Relation Age of Onset  . Anesthesia problems Neg Hx   . Cancer Mother     LUNG   History  Substance Use Topics  . Smoking status: Current Every Day Smoker -- 1.00 packs/day for 20 years    Types: Cigarettes  . Smokeless tobacco: Never Used     Comment: 1/2 PPD  . Alcohol Use: No   OB History   Grav Para Term Preterm Abortions TAB SAB Ect Mult Living   6 4 4  2 1 1   4      Review of Systems  All other systems reviewed and are negative.     Allergies  Hctz and Penicillins  Home Medications   Prior to  Admission medications   Medication Sig Start Date End Date Taking? Authorizing Provider  diphenhydrAMINE (BENADRYL) 25 MG tablet Take 50 mg by mouth every 6 (six) hours as needed for itching or allergies.   Yes Historical Provider, MD  hydrOXYzine (ATARAX/VISTARIL) 25 MG tablet Take 1 tablet (25 mg total) by mouth every 8 (eight) hours as needed for itching. 06/26/13  Yes Gregor Hams, MD  lisinopril (PRINIVIL,ZESTRIL) 10 MG tablet Take 2 tablets (20 mg total) by mouth daily. TAKE ONE TABLET BY MOUTH EVERY DAY 06/24/13  Yes Jerene Pitch, MD   BP 131/68  Pulse 69  Temp(Src) 97.5 F (36.4 C) (Oral)  Resp 15  SpO2 100% Physical Exam  Nursing note and vitals reviewed. Constitutional: She appears well-developed and well-nourished. No distress.  HENT:  Head: Normocephalic and atraumatic.  Mouth/Throat: Oropharynx is clear and moist. No oropharyngeal exudate.  Eyes: Conjunctivae and EOM are normal. Pupils are equal, round, and reactive to light. Right eye exhibits no discharge. Left eye exhibits no discharge. No scleral icterus.  Neck: Normal range of motion. Neck supple. No JVD present. No thyromegaly present.  Cardiovascular: Normal rate, regular rhythm, normal heart sounds and intact distal pulses.  Exam reveals no gallop  and no friction rub.   No murmur heard. Pulmonary/Chest: Effort normal and breath sounds normal. No respiratory distress. She has no wheezes. She has no rales.  Abdominal: Soft. Bowel sounds are normal. She exhibits no distension and no mass. There is no tenderness.  Musculoskeletal: Normal range of motion. She exhibits no edema and no tenderness.  Lymphadenopathy:    She has no cervical adenopathy.  Neurological: She is alert. Coordination normal.  Speech is clear, cranial nerves III through XII are intact, memory is intact, strength is normal in all 4 extremities including grips, sensation is intact to light touch and pinprick in all 4 extremities. Coordination as tested by  finger-nose-finger is normal, no limb ataxia. Normal gait, normal reflexes at the patellar tendons bilaterally  Skin: Skin is warm and dry. No rash noted. No erythema.  Psychiatric: She has a normal mood and affect. Her behavior is normal.    ED Course  Procedures (including critical care time) Labs Review Labs Reviewed  BASIC METABOLIC PANEL - Abnormal; Notable for the following:    Glucose, Bld 104 (*)    Creatinine, Ser 0.48 (*)    All other components within normal limits  CBG MONITORING, ED - Abnormal; Notable for the following:    Glucose-Capillary 101 (*)    All other components within normal limits  CBC  URINALYSIS, ROUTINE W REFLEX MICROSCOPIC    Imaging Review No results found.   EKG Interpretation   Date/Time:  Monday July 29 2013 19:47:17 EDT Ventricular Rate:  74 PR Interval:  158 QRS Duration: 72 QT Interval:  364 QTC Calculation: 404 R Axis:   10 Text Interpretation:  Normal sinus rhythm Normal ECG since last tracing no  significant change Confirmed by Barby Colvard  MD, Adamae Ricklefs ([id number]) on 07/29/2013  11:11:14 PM      MDM   Final diagnoses:  Other fatigue    The patient has normal vital signs, she was concerned because of a transient blood pressure of 856 systolic while she was at the pharmacy today, at this time blood pressure is normal range if not slightly elevated, no fever tachycardia hypoxia or other concerns. Check urinalysis, labs reviewed including CBC and basic metabolic panel without acute findings. EKG without acute findings.  Labs negative, urinalysis normal, vital signs reassuring, patient reassurance stable for discharge.  Johnna Acosta, MD 07/30/13 [phone]

## 2013-07-30 DIAGNOSIS — R5381 Other malaise: Secondary | ICD-10-CM | POA: Diagnosis not present

## 2013-07-30 DIAGNOSIS — R5383 Other fatigue: Secondary | ICD-10-CM | POA: Diagnosis not present

## 2013-07-30 LAB — URINALYSIS, ROUTINE W REFLEX MICROSCOPIC
Bilirubin Urine: NEGATIVE
Glucose, UA: NEGATIVE mg/dL
HGB URINE DIPSTICK: NEGATIVE
Ketones, ur: NEGATIVE mg/dL
Leukocytes, UA: NEGATIVE
Nitrite: NEGATIVE
PROTEIN: NEGATIVE mg/dL
Specific Gravity, Urine: 1.007 (ref 1.005–1.030)
UROBILINOGEN UA: 0.2 mg/dL (ref 0.0–1.0)
pH: 6 (ref 5.0–8.0)

## 2013-07-30 NOTE — Discharge Instructions (Signed)
Your testing has all been normal, followup with your doctor in the next week, return if symptoms worsen including chest pain, difficulty breathing or focal weakness such as weakness in her arms or legs, numbness, change in vision or difficulty walking.  If you cannot have a Dr. see below.   Emergency Department Resource Guide 1) Find a Doctor and Pay Out of Pocket Although you won't have to find out who is covered by your insurance plan, it is a good idea to ask around and get recommendations. You will then need to call the office and see if the doctor you have chosen will accept you as a new patient and what types of options they offer for patients who are self-pay. Some doctors offer discounts or will set up payment plans for their patients who do not have insurance, but you will need to ask so you aren't surprised when you get to your appointment.  2) Contact Your Local Health Department Not all health departments have doctors that can see patients for sick visits, but many do, so it is worth a call to see if yours does. If you don't know where your local health department is, you can check in your phone book. The CDC also has a tool to help you locate your state's health department, and many state websites also have listings of all of their local health departments.  3) Find a [facility] If your illness is not likely to be very severe or complicated, you may want to try a walk in clinic. These are popping up all over the country in pharmacies, drugstores, and shopping centers. They're usually staffed by nurse practitioners or physician assistants that have been trained to treat common illnesses and complaints. They're usually fairly quick and inexpensive. However, if you have serious medical issues or chronic medical problems, these are probably not your best option.  No Primary Care Doctor: - Call Health Connect at  [phone] - they can help you locate a primary care doctor that  accepts your  insurance, provides certain services, etc. - Physician Referral Service- [phone]  Chronic Pain Problems: Organization         Address  Phone   Notes  [facility]  [phone] Patients need to be referred by their primary care doctor.   Medication Assistance: Organization         Address  Phone   Notes  [facility] Medication [facility] [address] [place], [place] [phone] --Must be a resident of [facility] -- Must have NO insurance coverage whatsoever (no Medicaid/ Medicare, etc.) -- The pt. MUST have a primary care doctor that directs their care regularly and follows them in the community   MedAssist  [phone]   [employer]  [phone]    Agencies that provide inexpensive medical care: Organization         Address  Phone   Notes  [place]  [phone]   [facility]    [phone]   [facility] [place], [place] [address] [phone]   [place] [address] [place] [phone]   Planned Parenthood    [phone]   [facility]    [phone]   [place] and [place] [address], [place] Phone:  [phone], Fax:  [phone] Hours of Operation:  9  am - 6 pm, M-F.  Also accepts Medicaid/Medicare and self-pay.  [facility] for [place] [address], [address], [place] Phone: [phone], Fax: [phone]. Hours of Operation:  8:30 am - 5:30 pm, M-F.  Also accepts Medicaid and self-pay.  [facility] [place] [address], [place] Phone: [phone]   [place], [place], [place] [phone], Ext. [phone] Mondays & Thursdays: 7-9 AM.  First 15 patients are seen on a first come, first serve basis.    [place] Providers:  Organization          Address  Phone   Notes  [facility] [address], [place] [phone] Also accepts self-pay patients.  [facility] [address], [place]  [phone]   [place], [address], [place] [phone]   [facility] [address], [place] [phone]   Lucianne Lei [address], [place]   [phone] Only accepts [place] Access [place] patients after they have their name applied to their card.   Self-Pay (no insurance) in [facility]:  Organization         Address  Phone   Notes  Sickle Cell Patients, [facility] [place] [phone]   [facility] [place] [phone]   [facility] [place]  [place], [place], [place] [phone]   [facility]/Dr. Osei-Bonsu  [address], [place] or [address], [place] [phone] Phone number for both [place] and [place] locations is the same.  [facility] [address], [place] [phone]   [facility] [address], [place] or [address] [phone]   [facility] [address], [place] [phone], phone; [phone], fax Sees patients 1st and 3rd Saturday of every month.  Must not qualify for public or private insurance (i.e. Medicaid, Medicare, [place] Health Choice, Veterans' Benefits)  Household income should be no more than 200% of the poverty level The clinic cannot treat you if you are pregnant or think you are pregnant  Sexually transmitted diseases are not treated at the clinic.    Dental Care: Organization         Address  Phone  Notes  [facility] of [facility] [place] [phone] Accepts children up to age 52 who are enrolled in [place] or [place]; pregnant women with a Medicaid card; and children who have applied for Medicaid or [place] Health Choice, but were declined, whose parents can pay a reduced fee at time of service.  [facility] Department of [facility]  [address], [place] [phone] Accepts children up to age 78 who are enrolled in [place] or [place]; pregnant women with a Medicaid card; and children who have applied for Medicaid or  Health Choice, but were declined, whose parents can pay a reduced fee at time of service.  [place] Adult [facility] Access PROGRAM  [place] [phone] Patients are seen by appointment only. Walk-ins are not accepted. [place] will see patients 18 years of  age and older. Monday - Tuesday (8am-5pm) Most Wednesdays (8:30-5pm) $30 per visit, cash only  [facility] Adult Dental Access PROGRAM  [address], [facility] [phone] Patients are seen by appointment only. Walk-ins are not accepted. [place] will see patients 57 years of age and older. One Wednesday Evening (Monthly: Volunteer Based).  $30 per visit, cash only  [place]  [phone] for adults; Children under age 78, call Graduate Pediatric Dentistry at [phone]. Children aged 43-14, please call [phone] to request a pediatric application.  Dental services are provided in all areas of dental care including fillings, crowns and bridges, complete and partial dentures, implants, gum treatment, root canals, and extractions. Preventive care is also provided. Treatment is provided to both adults and children. Patients are selected via a lottery and there is often a waiting list.   [facility] [address] [place]  [phone] [email or web address]   [facility] [address] [place], [place]  [phone], Ext. [phone] Second and Fourth Thursday of each month, opens at 6:30 AM; Clinic ends at 9 AM.  Patients are seen on a first-come first-served basis, and a limited number are seen during each clinic.   [facility]  [address] Hillard Danker [place], [place] [phone]   Eligibility Requirements You must have lived in [place], [place], or [place] counties for at least the last three months.   You cannot be eligible for state or federal sponsored [employer], including [employer], [place], or [employer].   You generally cannot be eligible for healthcare insurance through your employer.    How to apply: Eligibility screenings are held every Tuesday and Wednesday afternoon from 1:00 pm until 4:00 pm. You do not need an appointment for the interview!  [facility] [address] [place], [place]   [place]  [place] Department  [place]  [phone]    Behavioral Health Resources in the Community: Intensive Outpatient Programs Organization         Address  Phone  Notes  [place]. [address], [place], [place] [phone]   [facility] Outpatient [address], [place], [place]   ADS: Alcohol & Drug Svcs [address] [place], [place]   [place] [address],  [place], [place] or [phone]   Substance Abuse Resources Organization         Address  Phone  Notes  Alcohol and Drug Services  [phone]   [place]  [phone]   [facility] [place]   Chinita Pester  [phone]   Residential & Outpatient Substance Abuse Program  [phone]   Psychological Services Organization         Address  Phone  Notes  [facility] [place]  [place]  [phone]    [facility] [address] [place] or [phone]    Mobile Crisis Teams Organization         Address  Phone  Notes  [facility], Mobile Crisis Care Unit  [phone]   [facility]  [address] [place], [place]   [facility] [address] [place] [phone]    Self-Help/Support Groups Organization  Address  Phone             Notes  [place]. of [place] - variety of support groups  [place] Call for more information  Narcotics Anonymous (NA), [facility] [address], [employer] [place]  2 meetings at this location   Special educational needs teacher         Address  Phone  Notes  [facility] [place],    [place]  [phone]   [facility]  [address] [place] [address], [place], [place]   [place], [place] [phone] Admissions: 8am-3pm M-F  Incentives Substance [place] [address],    [place], [place] [phone]   [facility] [address] [place], [place], [place]   [facility] [address]  [place], [place]   [facility] Programs - Intensive Outpatient [address] Krist[address], [place], [place]   [facility] ([place].,  [place], [place] [phone] or [phone]   [facility] Services (RTS) [address] [place], [place] Accepts Medicaid  [facility] [place] [address]  [place] [phone] Substance Abuse/Addiction Treatment   [facility] Organization         Address  Phone  Notes  [facility]  [phone]   Domenic Schwab, PhD [address] [place]lis [place], [place]   [phone] or [phone]   [place]  [place], [place] [phone]   [facility] [address] [place], [place] [phone] Insurance/Medicaid/sponsorship through [facility] and Families [address] [place]                                    [place], [place] [phone] [place] [address], [place] [phone]    Dr. Adele Schilder  [phone]   [facility] [place] Dept. 1) [address], [place] 2) [place] 3)  [place] [address], [place] [phone]  [phone]   [place] [phone] or [phone] (After Hours)

## 2013-07-30 NOTE — ED Notes (Signed)
Called Agricultural consultant for transportation.  He comes to room. Patient leaving with taxi voucher.

## 2013-07-30 NOTE — ED Notes (Signed)
Discussed need to void for urine sample 

## 2013-07-30 NOTE — ED Notes (Signed)
Patient request trash to be pulled. Pulled for as patient requsted.

## 2013-11-18 ENCOUNTER — Encounter (HOSPITAL_COMMUNITY): Payer: Self-pay | Admitting: Emergency Medicine

## 2014-01-11 ENCOUNTER — Encounter (HOSPITAL_COMMUNITY): Payer: Self-pay | Admitting: *Deleted

## 2014-01-11 ENCOUNTER — Emergency Department (HOSPITAL_COMMUNITY)
Admission: EM | Admit: 2014-01-11 | Discharge: 2014-01-11 | Disposition: A | Discharge status: Home / Self Care | Payer: Medicare Other | Attending: Emergency Medicine | Admitting: Emergency Medicine

## 2014-01-11 DIAGNOSIS — S80861A Insect bite (nonvenomous), right lower leg, initial encounter: Secondary | ICD-10-CM | POA: Insufficient documentation

## 2014-01-11 DIAGNOSIS — E669 Obesity, unspecified: Secondary | ICD-10-CM | POA: Diagnosis not present

## 2014-01-11 DIAGNOSIS — Z79899 Other long term (current) drug therapy: Secondary | ICD-10-CM | POA: Diagnosis not present

## 2014-01-11 DIAGNOSIS — S1086XA Insect bite of other specified part of neck, initial encounter: Secondary | ICD-10-CM | POA: Diagnosis present

## 2014-01-11 DIAGNOSIS — Z87440 Personal history of urinary (tract) infections: Secondary | ICD-10-CM | POA: Insufficient documentation

## 2014-01-11 DIAGNOSIS — W57XXXA Bitten or stung by nonvenomous insect and other nonvenomous arthropods, initial encounter: Secondary | ICD-10-CM | POA: Diagnosis not present

## 2014-01-11 DIAGNOSIS — Y998 Other external cause status: Secondary | ICD-10-CM | POA: Diagnosis not present

## 2014-01-11 DIAGNOSIS — S40261A Insect bite (nonvenomous) of right shoulder, initial encounter: Secondary | ICD-10-CM | POA: Insufficient documentation

## 2014-01-11 DIAGNOSIS — I10 Essential (primary) hypertension: Secondary | ICD-10-CM | POA: Insufficient documentation

## 2014-01-11 DIAGNOSIS — Z880 Allergy status to penicillin: Secondary | ICD-10-CM | POA: Diagnosis not present

## 2014-01-11 DIAGNOSIS — Y9389 Activity, other specified: Secondary | ICD-10-CM | POA: Insufficient documentation

## 2014-01-11 DIAGNOSIS — Z720 Tobacco use: Secondary | ICD-10-CM | POA: Insufficient documentation

## 2014-01-11 DIAGNOSIS — S80862A Insect bite (nonvenomous), left lower leg, initial encounter: Secondary | ICD-10-CM | POA: Insufficient documentation

## 2014-01-11 DIAGNOSIS — Y92009 Unspecified place in unspecified non-institutional (private) residence as the place of occurrence of the external cause: Secondary | ICD-10-CM | POA: Insufficient documentation

## 2014-01-11 MED ORDER — TRIAMCINOLONE ACETONIDE 0.1 % EX CREA: 1.0000 "application " | TOPICAL_CREAM | Freq: Two times a day (BID) | CUTANEOUS | Status: DC

## 2014-01-11 MED ORDER — DIPHENHYDRAMINE HCL 25 MG PO TABS: 25.0000 mg | ORAL_TABLET | Freq: Four times a day (QID) | ORAL | Status: DC

## 2014-01-11 NOTE — ED Notes (Signed)
Patient here with red bites for the last 2 days after staying at friends house.  Bites to neck, right collar, and legs

## 2014-01-11 NOTE — Discharge Instructions (Signed)
Bedbugs °Bedbugs are tiny bugs that live in and around beds. During the day, they hide in mattresses and other places near beds. They come out at night and bite people lying in bed. They need blood to live and grow. Bedbugs can be found in beds anywhere. Usually, they are found in places where many people come and go (hotels, shelters, hospitals). It does not matter whether the place is dirty or clean. °Getting bitten by bedbugs rarely causes a medical problem. The biggest problem can be getting rid of them.  This often takes the work of a pest control expert. °CAUSES °· Less use of pesticides. Bedbugs were common before the 1950s. Then, strong pesticides such as DDT nearly wiped them out. Today, these pesticides are not used because they harm the environment and can cause health problems. °· More travel. Besides mattresses, bedbugs can also live in clothing and luggage. They can come along as people travel from place to place. Bedbugs are more common in certain parts of the world. When people travel to those areas, the bugs can come home with them. °· Presence of birds and bats. Bedbugs often infest birds and bats. If you have these animals in or near your home, bedbugs may infest your house, too. °SYMPTOMS °It does not hurt to be bitten by a bedbug. You will probably not wake up when you are bitten. Bedbugs usually bite areas of the skin that are not covered. Symptoms may show when you wake up, or they may take a day or more to show up. Symptoms may include: °· Small red bumps on the skin. These might be lined up in a row or clustered in a group. °· A darker red dot in the middle of red bumps. °· Blisters on the skin. There may be swelling and very bad itching. These may be signs of an allergic reaction. This does not happen often. °DIAGNOSIS °Bedbug bites might look and feel like other types of insect bites. The bugs do not stay on the body like ticks or lice. They bite, drop off, and crawl away to hide. Your  caregiver will probably: °· Ask about your symptoms. °· Ask about your recent activities and travel. °· Check your skin for bedbug bites. °· Ask you to check at home for signs of bedbugs. You should look for: °¨ Spots or stains on the bed or nearby. This could be from bedbugs that were crushed or from their eggs or waste. °¨ Bedbugs themselves. They are reddish-brown, oval, and flat. They do not fly. They are about the size of an apple seed. °· Places to look for bedbugs include: °¨ Beds. Check mattresses, headboards, box springs, and bed frames. °¨ On drapes and curtains near the bed. °¨ Under carpeting in the bedroom. °¨ Behind electrical outlets. °¨ Behind any wallpaper that is peeling. °¨ Inside luggage. °TREATMENT °Most bedbug bites do not need treatment. They usually go away on their own in a few days. The bites are not dangerous. However, treatment may be needed if you have scratched so much that your skin has become infected. You may also need treatment if you are allergic to bedbug bites. Treatment options include: °· A drug that stops swelling and itching (corticosteroid). Usually, a cream is rubbed on the skin. If you have a bad rash, you may be given a corticosteroid pill. °· Oral antihistamines. These are pills to help control itching. °· Antibiotic medicines. An antibiotic may be prescribed for infected skin. °HOME CARE INSTRUCTIONS  °·   given a corticosteroid pill.  · Oral antihistamines. These are pills to help control itching.  · Antibiotic medicines. An antibiotic may be prescribed for infected skin.  HOME CARE INSTRUCTIONS   · Take any medicine prescribed by your caregiver for your bites. Follow the directions carefully.  · Consider wearing pajamas with long sleeves and pant legs.  · Your bedroom may need to be treated. A pest control expert should make sure the bedbugs are gone. You may need to throw away mattresses or luggage. Ask the pest control expert what you can do to keep the bedbugs from coming back. Common suggestions include:  ¨ Putting a plastic cover over your mattress.  ¨ Washing and drying your clothes and bedding in hot water and a hot dryer. The temperature should be hotter  than 120° F (48.9° C). Bedbugs are killed by high temperatures.  ¨ Vacuuming carefully all around your bed. Vacuum in all cracks and crevices where the bugs might hide. Do this often.  ¨ Carefully checking all used furniture, bedding, or clothes that you bring into your house.  ¨ Eliminating bird nests and bat roosts.  · If you get bedbug bites when traveling, check all your possessions carefully before bringing them into your house. If you find any bugs on clothes or in your luggage, consider throwing those items away.  SEEK MEDICAL CARE IF:  · You have red bug bites that keep coming back.  · You have red bug bites that itch badly.  · You have bug bites that cause a skin rash.  · You have scratch marks that are red and sore.  SEEK IMMEDIATE MEDICAL CARE IF:  You have a fever.  Document Released: 02/05/2010 Document Revised: 03/28/2011 Document Reviewed: 02/05/2010  ExitCare® Patient Information ©2015 [employer]. This information is not intended to replace advice given to you by your health care provider. Make sure you discuss any questions you have with your health care provider.

## 2014-01-11 NOTE — ED Provider Notes (Signed)
CSN: [id number]     Arrival date & time 01/11/14  1015 History  This chart was scribed for non-physician practitioner working with Orpah Greek, by Molli Posey, ED Scribe. This patient was seen in room TR10C/TR10C and the patient's care was started at 10:36 AM.      Chief Complaint  Patient presents with  . Insect Bite   The history is provided by the patient. No language interpreter was used.   HPI Comments: April Mathis is a 63 y.o. female who presents to the Emergency Department complaining of red insect bites for the last 2 days after staying at a friends house. Pt reports she has insect bites to her neck, right collar and legs. She reports associated itchiness and minimal swelling to the affected areas. She states she has taken no medications for her symptoms but has tried rubbing toothpaste on the areas which failed to provide relief. Pt reports no alleviating factors at this time.    Past Medical History  Diagnosis Date  . Hypertension   . Bilateral leg edema   . Obesity   . Smoking   . Knee pain   . Left knee DJD 2002    MRI done in 2002  . History of recurrent UTIs   . Varicose vein    Past Surgical History  Procedure Laterality Date  . Knee arthroscopy    . Tubal ligation     Family History  Problem Relation Age of Onset  . Anesthesia problems Neg Hx   . Cancer Mother     LUNG   History  Substance Use Topics  . Smoking status: Current Every Day Smoker -- 1.00 packs/day for 20 years    Types: Cigarettes  . Smokeless tobacco: Never Used     Comment: 1/2 PPD  . Alcohol Use: No   OB History    Gravida Para Term Preterm AB TAB SAB Ectopic Multiple Living   6 4 4  2 1 1   4      Review of Systems  Skin: Positive for color change and rash.  All other systems reviewed and are negative.   Allergies  Hctz and Penicillins  Home Medications   Prior to Admission medications   Medication Sig Start Date End Date Taking? Authorizing Provider   diphenhydrAMINE (BENADRYL) 25 MG tablet Take 50 mg by mouth every 6 (six) hours as needed for itching or allergies.    Historical Provider, MD  hydrOXYzine (ATARAX/VISTARIL) 25 MG tablet Take 1 tablet (25 mg total) by mouth every 8 (eight) hours as needed for itching. 06/26/13   Gregor Hams, MD  lisinopril (PRINIVIL,ZESTRIL) 10 MG tablet Take 2 tablets (20 mg total) by mouth daily. TAKE ONE TABLET BY MOUTH EVERY DAY 06/24/13   Wilber Oliphant, MD   BP 188/87 mmHg  Pulse 71  Temp(Src) 97.5 F (36.4 C) (Oral)  Resp 18  SpO2 100%   Physical Exam  Constitutional: She is oriented to person, place, and time. She appears well-developed and well-nourished. No distress.  HENT:  Head: Normocephalic and atraumatic.  Eyes: Right eye exhibits no discharge. Left eye exhibits no discharge.  Neck: Neck supple. No tracheal deviation present.  Cardiovascular: Normal rate.   Pulmonary/Chest: Effort normal. No respiratory distress.  Abdominal: She exhibits no distension.  Neurological: She is alert and oriented to person, place, and time.  Skin: Skin is warm and dry.  3 red areas noted on skin. No infection noted to area  Psychiatric: She  has a normal mood and affect. Her behavior is normal.  Nursing note and vitals reviewed.   ED Course  Procedures   DIAGNOSTIC STUDIES: Oxygen Saturation is 100% on RA, normal by my interpretation.    COORDINATION OF CARE: 10:45 AM Discussed treatment plan with pt at bedside and pt agreed to plan.   Labs Review Labs Reviewed - No data to display  Imaging Review No results found.   EKG Interpretation None      MDM   Final diagnoses:  Bed bug bite   Pt give prednisone for symptomatic relief. Discussed spread risk  I personally performed the services described in this documentation, which was scribed in my presence. The recorded information has been reviewed and is accurate.     Glendell Docker, NP 01/11/14 1100  Glendell Docker,  NP 01/11/14 [place], MD 01/11/14 1314

## 2014-04-28 NOTE — Telephone Encounter (Signed)
Entered in error

## 2014-05-31 ENCOUNTER — Encounter (HOSPITAL_COMMUNITY): Payer: Self-pay

## 2014-05-31 ENCOUNTER — Inpatient Hospital Stay (HOSPITAL_COMMUNITY)
Admission: AD | Admit: 2014-05-31 | Discharge: 2014-05-31 | Disposition: A | Discharge status: Home / Self Care | Payer: Medicare Other | Source: Ambulatory Visit | Attending: Obstetrics | Admitting: Obstetrics

## 2014-05-31 DIAGNOSIS — F17210 Nicotine dependence, cigarettes, uncomplicated: Secondary | ICD-10-CM | POA: Diagnosis not present

## 2014-05-31 DIAGNOSIS — N390 Urinary tract infection, site not specified: Secondary | ICD-10-CM | POA: Diagnosis not present

## 2014-05-31 DIAGNOSIS — Z113 Encounter for screening for infections with a predominantly sexual mode of transmission: Secondary | ICD-10-CM

## 2014-05-31 DIAGNOSIS — I10 Essential (primary) hypertension: Secondary | ICD-10-CM | POA: Diagnosis not present

## 2014-05-31 DIAGNOSIS — N23 Unspecified renal colic: Secondary | ICD-10-CM | POA: Diagnosis present

## 2014-05-31 HISTORY — DX: Acute embolism and thrombosis of unspecified deep veins of unspecified lower extremity: I82.409

## 2014-05-31 LAB — URINE MICROSCOPIC-ADD ON

## 2014-05-31 LAB — POCT PREGNANCY, URINE: PREG TEST UR: NEGATIVE

## 2014-05-31 LAB — WET PREP, GENITAL
Clue Cells Wet Prep HPF POC: NONE SEEN
TRICH WET PREP: NONE SEEN
Yeast Wet Prep HPF POC: NONE SEEN

## 2014-05-31 LAB — URINALYSIS, ROUTINE W REFLEX MICROSCOPIC
Bilirubin Urine: NEGATIVE
GLUCOSE, UA: NEGATIVE mg/dL
KETONES UR: NEGATIVE mg/dL
Nitrite: NEGATIVE
Protein, ur: NEGATIVE mg/dL
SPECIFIC GRAVITY, URINE: 1.01 (ref 1.005–1.030)
Urobilinogen, UA: 0.2 mg/dL (ref 0.0–1.0)
pH: 7 (ref 5.0–8.0)

## 2014-05-31 MED ORDER — CIPROFLOXACIN HCL 500 MG PO TABS: 500.0000 mg | ORAL_TABLET | Freq: Two times a day (BID) | ORAL | Status: DC

## 2014-05-31 MED ORDER — ACETAMINOPHEN 325 MG PO TABS
650.0000 mg | ORAL_TABLET | ORAL | Status: AC
  Administered 2014-05-31: 650 mg via ORAL
  Filled 2014-05-31: qty 2

## 2014-05-31 NOTE — Discharge Instructions (Signed)

## 2014-05-31 NOTE — MAU Note (Signed)
Pt states Dr. Ruthann Cancer has given pt rx for BV before. Pt has vaginal irritation and still seeing yellow vaginal discharge. Also has pain when she voids since last pm. Voids small amounts only.

## 2014-05-31 NOTE — MAU Provider Note (Signed)
History     CSN: [id number]  Arrival date and time: 05/31/14 9390   First Provider Initiated Contact with Patient 05/31/14 1034      Chief Complaint  Patient presents with  . Urinary Tract Infection   HPI April Mathis 64 y.o. [id number] nonpregnant female presents for eval for UTI.  She has been having pain with urination since last evening.  She has started drinking more water.  She denies nausea, vomiting, fever, weakness, vaginal discharge.  She has abdominal pain in the lower abdomen and back of her right side.  She is sexually active and does not use condoms.   She was diagnosed with bacterial infection 4-5 months ago and used a cream for this.  She requests STD testing.    She has not yet taken her BP meds today. OB History    Gravida Para Term Preterm AB TAB SAB Ectopic Multiple Living   6 4 4  2 1 1   4       Past Medical History  Diagnosis Date  . Hypertension   . Bilateral leg edema   . Obesity   . Smoking   . Knee pain   . Left knee DJD 2002    MRI done in 2002  . History of recurrent UTIs   . Varicose vein   . DVT of lower extremity (deep venous thrombosis)     Past Surgical History  Procedure Laterality Date  . Knee arthroscopy    . Tubal ligation      Family History  Problem Relation Age of Onset  . Anesthesia problems Neg Hx   . Cancer Mother     LUNG    History  Substance Use Topics  . Smoking status: Current Every Day Smoker -- 1.00 packs/day for 20 years    Types: Cigarettes  . Smokeless tobacco: Never Used     Comment: 1/2 PPD  . Alcohol Use: No    Allergies:  Allergies  Allergen Reactions  . Hctz [Hydrochlorothiazide]     Pt states it makes her BP too low in 2006 & 2007 while hospitalized   . Penicillins Hives    Prescriptions prior to admission  Medication Sig Dispense Refill Last Dose  . aspirin 81 MG tablet Take 81 mg by mouth daily.    Past Week at Unknown time  . diphenhydrAMINE (BENADRYL) 25 MG tablet Take 1 tablet (25 mg  total) by mouth every 6 (six) hours. 10 tablet 0 Past Month at Unknown time  . hydrOXYzine (ATARAX/VISTARIL) 25 MG tablet Take 1 tablet (25 mg total) by mouth every 8 (eight) hours as needed for itching. (Patient not taking: Reported on 05/31/2014) 20 tablet 0 Completed Course at Unknown time  . lisinopril (PRINIVIL,ZESTRIL) 10 MG tablet Take 2 tablets (20 mg total) by mouth daily. TAKE ONE TABLET BY MOUTH EVERY DAY 180 tablet 0 05/31/2014 at Unknown time  . triamcinolone cream (KENALOG) 0.1 % Apply 1 application topically 2 (two) times daily. (Patient not taking: Reported on 05/31/2014) 30 g 0 Not Taking at Unknown time    ROS Pertinent ROS in HPI.  All other systems are negative.   Physical Exam   Blood pressure 151/67, pulse 78, temperature 98.1 F (36.7 C), temperature source Oral, resp. rate 18, height 5\' 2"  (1.575 m), weight 186 lb 6 oz (84.539 kg).  Physical Exam  Constitutional: She is oriented to person, place, and time. She appears well-developed and well-nourished. No distress.  HENT:  Head: Normocephalic and  atraumatic.  Eyes: EOM are normal.  Neck: Normal range of motion.  Cardiovascular: Normal rate and regular rhythm.   Respiratory: Effort normal and breath sounds normal. No respiratory distress.  GI: Soft. Bowel sounds are normal. She exhibits no distension. There is no tenderness. There is no rebound and no guarding.  No CVA tenderness  Genitourinary:  Small amt of thin white discharge in vagina.  Cervix is pink, smooth No CMT.  No adnexal mass or tenderness  Musculoskeletal: Normal range of motion.  Neurological: She is alert and oriented to person, place, and time.  Skin: Skin is warm and dry.  Psychiatric: She has a normal mood and affect.   Results for orders placed or performed during the hospital encounter of 05/31/14 (from the past 24 hour(s))  Urinalysis, Routine w reflex microscopic     Status: Abnormal   Collection Time: 05/31/14  9:38 AM  Result Value Ref  Range   Color, Urine YELLOW YELLOW   APPearance HAZY (A) CLEAR   Specific Gravity, Urine 1.010 1.005 - 1.030   pH 7.0 5.0 - 8.0   Glucose, UA NEGATIVE NEGATIVE mg/dL   Hgb urine dipstick MODERATE (A) NEGATIVE   Bilirubin Urine NEGATIVE NEGATIVE   Ketones, ur NEGATIVE NEGATIVE mg/dL   Protein, ur NEGATIVE NEGATIVE mg/dL   Urobilinogen, UA 0.2 0.0 - 1.0 mg/dL   Nitrite NEGATIVE NEGATIVE   Leukocytes, UA LARGE (A) NEGATIVE  Urine microscopic-add on     Status: Abnormal   Collection Time: 05/31/14  9:38 AM  Result Value Ref Range   Squamous Epithelial / LPF FEW (A) RARE   WBC, UA 21-50 <3 WBC/hpf   RBC / HPF 7-10 <3 RBC/hpf   Bacteria, UA FEW (A) RARE  Pregnancy, urine POC     Status: None   Collection Time: 05/31/14  9:46 AM  Result Value Ref Range   Preg Test, Ur NEGATIVE NEGATIVE  Wet prep, genital     Status: Abnormal   Collection Time: 05/31/14 11:00 AM  Result Value Ref Range   Yeast Wet Prep HPF POC NONE SEEN NONE SEEN   Trich, Wet Prep NONE SEEN NONE SEEN   Clue Cells Wet Prep HPF POC NONE SEEN NONE SEEN   WBC, Wet Prep HPF POC FEW (A) NONE SEEN     MAU Course  Procedures  MDM U/A suggestive of UTI Tylenol given as pt requests something to help her feel better while in MAU.   Assessment and Plan  A: UTI Hypertension  P: Discharge to home Macrobid Increase po water intake Take BP medicine STD tests are pending F/u with PCP  Jaclyn Prime, Collene Leyden 05/31/2014, 10:35 AM

## 2014-06-01 LAB — HIV ANTIBODY (ROUTINE TESTING W REFLEX): HIV Screen 4th Generation wRfx: NONREACTIVE

## 2014-06-01 LAB — RPR: RPR: NONREACTIVE

## 2014-06-02 LAB — GC/CHLAMYDIA PROBE AMP (~~LOC~~) NOT AT ARMC
Chlamydia: NEGATIVE
Neisseria Gonorrhea: NEGATIVE

## 2014-06-03 ENCOUNTER — Emergency Department (HOSPITAL_COMMUNITY)
Admission: EM | Admit: 2014-06-03 | Discharge: 2014-06-03 | Disposition: A | Discharge status: Home / Self Care | Payer: Medicare Other | Attending: Emergency Medicine | Admitting: Emergency Medicine

## 2014-06-03 ENCOUNTER — Encounter (HOSPITAL_COMMUNITY): Payer: Self-pay

## 2014-06-03 DIAGNOSIS — R300 Dysuria: Secondary | ICD-10-CM | POA: Diagnosis present

## 2014-06-03 DIAGNOSIS — Z79899 Other long term (current) drug therapy: Secondary | ICD-10-CM | POA: Insufficient documentation

## 2014-06-03 DIAGNOSIS — Z720 Tobacco use: Secondary | ICD-10-CM | POA: Diagnosis not present

## 2014-06-03 DIAGNOSIS — I10 Essential (primary) hypertension: Secondary | ICD-10-CM | POA: Insufficient documentation

## 2014-06-03 DIAGNOSIS — Z7982 Long term (current) use of aspirin: Secondary | ICD-10-CM | POA: Insufficient documentation

## 2014-06-03 DIAGNOSIS — T50905A Adverse effect of unspecified drugs, medicaments and biological substances, initial encounter: Secondary | ICD-10-CM

## 2014-06-03 DIAGNOSIS — Z86718 Personal history of other venous thrombosis and embolism: Secondary | ICD-10-CM | POA: Diagnosis not present

## 2014-06-03 DIAGNOSIS — R110 Nausea: Secondary | ICD-10-CM | POA: Diagnosis not present

## 2014-06-03 DIAGNOSIS — E669 Obesity, unspecified: Secondary | ICD-10-CM | POA: Diagnosis not present

## 2014-06-03 DIAGNOSIS — Z87440 Personal history of urinary (tract) infections: Secondary | ICD-10-CM | POA: Insufficient documentation

## 2014-06-03 DIAGNOSIS — R109 Unspecified abdominal pain: Secondary | ICD-10-CM | POA: Insufficient documentation

## 2014-06-03 DIAGNOSIS — T368X5A Adverse effect of other systemic antibiotics, initial encounter: Secondary | ICD-10-CM | POA: Insufficient documentation

## 2014-06-03 DIAGNOSIS — Z880 Allergy status to penicillin: Secondary | ICD-10-CM | POA: Diagnosis not present

## 2014-06-03 LAB — CBG MONITORING, ED: Glucose-Capillary: 84 mg/dL (ref 65–99)

## 2014-06-03 LAB — URINE MICROSCOPIC-ADD ON

## 2014-06-03 LAB — URINALYSIS, ROUTINE W REFLEX MICROSCOPIC
Bilirubin Urine: NEGATIVE
GLUCOSE, UA: NEGATIVE mg/dL
Ketones, ur: NEGATIVE mg/dL
Nitrite: NEGATIVE
PH: 6 (ref 5.0–8.0)
Protein, ur: NEGATIVE mg/dL
SPECIFIC GRAVITY, URINE: 1.009 (ref 1.005–1.030)
Urobilinogen, UA: 1 mg/dL (ref 0.0–1.0)

## 2014-06-03 MED ORDER — SULFAMETHOXAZOLE-TRIMETHOPRIM 800-160 MG PO TABS: 1.0000 | ORAL_TABLET | Freq: Two times a day (BID) | ORAL | Status: AC

## 2014-06-03 NOTE — Discharge Instructions (Signed)

## 2014-06-03 NOTE — ED Notes (Signed)
Pt coming in from home with c/o abdominal pain since Saturday, also reports burning with urination. Was seen at [facility] clinic Saturday for same and prescribed her Cipro which she reports she cannot take.

## 2014-06-03 NOTE — ED Provider Notes (Signed)
CSN: [id number]     Arrival date & time 06/03/14  1048 History   First MD Initiated Contact with Patient 06/03/14 1051     Chief Complaint  Patient presents with  . Dysuria     (Consider location/radiation/quality/duration/timing/severity/associated sxs/prior Treatment) HPI Comments: Patient presents to the emergency department with chief complaints of dysuria and abdominal cramping. Patient states that she was seen at [facility] on Saturday for the same. She was prescribed Cipro. She thinks that the Cipro is making her stomach upset. She still complains of some dysuria. She denies any fevers, chills, or vomiting. She does report having some nausea. There are no other aggravating or alleviating factors.  The history is provided by the patient. No language interpreter was used.    Past Medical History  Diagnosis Date  . Hypertension   . Bilateral leg edema   . Obesity   . Smoking   . Knee pain   . Left knee DJD 2002    MRI done in 2002  . History of recurrent UTIs   . Varicose vein   . DVT of lower extremity (deep venous thrombosis)    Past Surgical History  Procedure Laterality Date  . Knee arthroscopy    . Tubal ligation     Family History  Problem Relation Age of Onset  . Anesthesia problems Neg Hx   . Cancer Mother     LUNG   History  Substance Use Topics  . Smoking status: Current Every Day Smoker -- 1.00 packs/day for 20 years    Types: Cigarettes  . Smokeless tobacco: Never Used     Comment: 1/2 PPD  . Alcohol Use: No   OB History    Gravida Para Term Preterm AB TAB SAB Ectopic Multiple Living   6 4 4  2 1 1   4      Review of Systems  Constitutional: Negative for fever and chills.  Respiratory: Negative for shortness of breath.   Cardiovascular: Negative for chest pain.  Gastrointestinal: Positive for nausea and abdominal pain. Negative for vomiting, diarrhea and constipation.  Genitourinary: Positive for dysuria.  All other systems reviewed and are  negative.     Allergies  Hctz and Penicillins  Home Medications   Prior to Admission medications   Medication Sig Start Date End Date Taking? Authorizing Provider  aspirin 81 MG tablet Take 81 mg by mouth daily.     Historical Provider, MD  ciprofloxacin (CIPRO) 500 MG tablet Take 1 tablet (500 mg total) by mouth 2 (two) times daily. Patient not taking: Reported on 06/03/2014 05/31/14   Paticia Stack, PA-C  diphenhydrAMINE (BENADRYL) 25 MG tablet Take 1 tablet (25 mg total) by mouth every 6 (six) hours. 01/11/14   Glendell Docker, NP  lisinopril (PRINIVIL,ZESTRIL) 10 MG tablet Take 2 tablets (20 mg total) by mouth daily. TAKE ONE TABLET BY MOUTH EVERY DAY 06/24/13   Wilber Oliphant, MD   BP 158/76 mmHg  Pulse 82  Temp(Src) 98.3 F (36.8 C) (Oral)  Resp 16  SpO2 100% Physical Exam  Constitutional: She is oriented to person, place, and time. She appears well-developed and well-nourished.  HENT:  Head: Normocephalic and atraumatic.  Eyes: Conjunctivae and EOM are normal. Pupils are equal, round, and reactive to light.  Neck: Normal range of motion. Neck supple.  Cardiovascular: Normal rate and regular rhythm.  Exam reveals no gallop and no friction rub.   No murmur heard. Pulmonary/Chest: Effort normal and breath sounds normal. No  respiratory distress. She has no wheezes. She has no rales. She exhibits no tenderness.  Abdominal: Soft. Bowel sounds are normal. She exhibits no distension and no mass. There is no tenderness. There is no rebound and no guarding.  No focal abdominal tenderness, no RLQ tenderness or pain at McBurney's point, no RUQ tenderness or Murphy's sign, no left-sided abdominal tenderness, no fluid wave, or signs of peritonitis   Musculoskeletal: Normal range of motion. She exhibits no edema or tenderness.  Neurological: She is alert and oriented to person, place, and time.  Skin: Skin is warm and dry.  Psychiatric: She has a normal mood and affect. Her  behavior is normal. Judgment and thought content normal.  Nursing note and vitals reviewed.   ED Course  Procedures (including critical care time) Results for orders placed or performed during the hospital encounter of 06/03/14  Urinalysis, Routine w reflex microscopic  Result Value Ref Range   Color, Urine YELLOW YELLOW   APPearance HAZY (A) CLEAR   Specific Gravity, Urine 1.009 1.005 - 1.030   pH 6.0 5.0 - 8.0   Glucose, UA NEGATIVE NEGATIVE mg/dL   Hgb urine dipstick MODERATE (A) NEGATIVE   Bilirubin Urine NEGATIVE NEGATIVE   Ketones, ur NEGATIVE NEGATIVE mg/dL   Protein, ur NEGATIVE NEGATIVE mg/dL   Urobilinogen, UA 1.0 0.0 - 1.0 mg/dL   Nitrite NEGATIVE NEGATIVE   Leukocytes, UA TRACE (A) NEGATIVE  Urine microscopic-add on  Result Value Ref Range   Squamous Epithelial / LPF FEW (A) RARE   WBC, UA 7-10 <3 WBC/hpf   RBC / HPF 3-6 <3 RBC/hpf   Bacteria, UA FEW (A) RARE   Casts HYALINE CASTS (A) NEGATIVE  CBG monitoring, ED  Result Value Ref Range   Glucose-Capillary 84 65 - 99 mg/dL   Comment 1 Notify RN    Comment 2 Document in Chart    No results found.    EKG Interpretation None      MDM   Final diagnoses:  Dysuria  Medication reaction, initial encounter    Patient with dysuria 3-4 days. [facility]. Having medication intolerance to Cipro.  Anticipate switching this to Keflex. Will check urinalysis and CBG.  Will switch Cipro to Bactrim. Discharge to home.  Recommend PCP follow-up.    Montine Circle, PA-C 06/03/14 1234  Pamella Pert, MD 06/03/14 1537

## 2014-09-15 ENCOUNTER — Encounter: Payer: Self-pay | Admitting: Internal Medicine

## 2015-03-04 ENCOUNTER — Encounter (HOSPITAL_COMMUNITY): Payer: Self-pay | Admitting: Family Medicine

## 2015-03-04 ENCOUNTER — Emergency Department (HOSPITAL_COMMUNITY)
Admission: EM | Admit: 2015-03-04 | Discharge: 2015-03-04 | Disposition: A | Discharge status: Home / Self Care | Payer: Medicare Other | Attending: Emergency Medicine | Admitting: Emergency Medicine

## 2015-03-04 DIAGNOSIS — E669 Obesity, unspecified: Secondary | ICD-10-CM | POA: Diagnosis not present

## 2015-03-04 DIAGNOSIS — Z8739 Personal history of other diseases of the musculoskeletal system and connective tissue: Secondary | ICD-10-CM | POA: Insufficient documentation

## 2015-03-04 DIAGNOSIS — Z87440 Personal history of urinary (tract) infections: Secondary | ICD-10-CM | POA: Insufficient documentation

## 2015-03-04 DIAGNOSIS — Z79899 Other long term (current) drug therapy: Secondary | ICD-10-CM | POA: Insufficient documentation

## 2015-03-04 DIAGNOSIS — Z7982 Long term (current) use of aspirin: Secondary | ICD-10-CM | POA: Insufficient documentation

## 2015-03-04 DIAGNOSIS — F17210 Nicotine dependence, cigarettes, uncomplicated: Secondary | ICD-10-CM | POA: Insufficient documentation

## 2015-03-04 DIAGNOSIS — Z880 Allergy status to penicillin: Secondary | ICD-10-CM | POA: Diagnosis not present

## 2015-03-04 DIAGNOSIS — I10 Essential (primary) hypertension: Secondary | ICD-10-CM | POA: Insufficient documentation

## 2015-03-04 DIAGNOSIS — Z86718 Personal history of other venous thrombosis and embolism: Secondary | ICD-10-CM | POA: Diagnosis not present

## 2015-03-04 DIAGNOSIS — H6011 Cellulitis of right external ear: Secondary | ICD-10-CM | POA: Insufficient documentation

## 2015-03-04 DIAGNOSIS — R51 Headache: Secondary | ICD-10-CM | POA: Diagnosis present

## 2015-03-04 LAB — CBG MONITORING, ED: GLUCOSE-CAPILLARY: 144 mg/dL — AB (ref 65–99)

## 2015-03-04 MED ORDER — CEPHALEXIN 500 MG PO CAPS: 500.0000 mg | ORAL_CAPSULE | Freq: Four times a day (QID) | ORAL | Status: DC

## 2015-03-04 MED ORDER — CEFTRIAXONE SODIUM 1 G IJ SOLR
1.0000 g | Freq: Once | INTRAMUSCULAR | Status: AC
Start: 2015-03-04 — End: 2015-03-04
  Administered 2015-03-04: 1 g via INTRAMUSCULAR
  Filled 2015-03-04: qty 10

## 2015-03-04 MED ORDER — SULFAMETHOXAZOLE-TRIMETHOPRIM 800-160 MG PO TABS: 1.0000 | ORAL_TABLET | Freq: Two times a day (BID) | ORAL | Status: AC

## 2015-03-04 MED ORDER — LIDOCAINE HCL (PF) 1 % IJ SOLN
2.1000 mL | Freq: Once | INTRAMUSCULAR | Status: AC
  Administered 2015-03-04: 2.1 mL

## 2015-03-04 MED ORDER — LIDOCAINE HCL (PF) 1 % IJ SOLN
INTRAMUSCULAR | Status: DC
Start: 2015-03-04 — End: 2015-03-04
  Filled 2015-03-04: qty 5

## 2015-03-04 NOTE — Discharge Instructions (Signed)
Keflex and Bactrim as prescribed.  Return to the emergency department if symptoms significantly worsen or change.   Cellulitis Cellulitis is an infection of the skin and the tissue beneath it. The infected area is usually red and tender. Cellulitis occurs most often in the arms and lower legs.  CAUSES  Cellulitis is caused by bacteria that enter the skin through cracks or cuts in the skin. The most common types of bacteria that cause cellulitis are staphylococci and streptococci. SIGNS AND SYMPTOMS   Redness and warmth.  Swelling.  Tenderness or pain.  Fever. DIAGNOSIS  Your health care provider can usually determine what is wrong based on a physical exam. Blood tests may also be done. TREATMENT  Treatment usually involves taking an antibiotic medicine. HOME CARE INSTRUCTIONS   Take your antibiotic medicine as directed by your health care provider. Finish the antibiotic even if you start to feel better.  Keep the infected arm or leg elevated to reduce swelling.  Apply a warm cloth to the affected area up to 4 times per day to relieve pain.  Take medicines only as directed by your health care provider.  Keep all follow-up visits as directed by your health care provider. SEEK MEDICAL CARE IF:   You notice red streaks coming from the infected area.  Your red area gets larger or turns dark in color.  Your bone or joint underneath the infected area becomes painful after the skin has healed.  Your infection returns in the same area or another area.  You notice a swollen bump in the infected area.  You develop new symptoms.  You have a fever. SEEK IMMEDIATE MEDICAL CARE IF:   You feel very sleepy.  You develop vomiting or diarrhea.  You have a general ill feeling (malaise) with muscle aches and pains.   This information is not intended to replace advice given to you by your health care provider. Make sure you discuss any questions you have with your health care  provider.   Document Released: 10/13/2004 Document Revised: 09/24/2014 Document Reviewed: 03/21/2011 Elsevier Interactive Patient Education [employer].

## 2015-03-04 NOTE — ED Provider Notes (Signed)
CSN: [id number]     Arrival date & time 03/04/15  1006 History   First MD Initiated Contact with Patient 03/04/15 1311     Chief Complaint  Patient presents with  . Insect Bite  . Headache     (Consider location/radiation/quality/duration/timing/severity/associated sxs/prior Treatment) HPI Comments: Patient is a 65 year old female with history of hypertension and prior DVT. She presents for evaluation of swelling of her right year. This started this morning after she believes she was bitten by a spider. This is cost her head to her it makes her vision feel blurry. She denies any fevers or chills. She denies any weakness or numbness.  The history is provided by the patient.    Past Medical History  Diagnosis Date  . Hypertension   . Bilateral leg edema   . Obesity   . Smoking   . Knee pain   . Left knee DJD 2002    MRI done in 2002  . History of recurrent UTIs   . Varicose vein   . DVT of lower extremity (deep venous thrombosis) [facility])    Past Surgical History  Procedure Laterality Date  . Knee arthroscopy    . Tubal ligation     Family History  Problem Relation Age of Onset  . Anesthesia problems Neg Hx   . Cancer Mother     LUNG   Social History  Substance Use Topics  . Smoking status: Current Every Day Smoker -- 1.00 packs/day for 20 years    Types: Cigarettes  . Smokeless tobacco: Never Used     Comment: 1/2 PPD  . Alcohol Use: No   OB History    Gravida Para Term Preterm AB TAB SAB Ectopic Multiple Living   6 4 4  2 1 1   4      Review of Systems  All other systems reviewed and are negative.     Allergies  Hctz and Penicillins  Home Medications   Prior to Admission medications   Medication Sig Start Date End Date Taking? Authorizing Provider  aspirin 81 MG tablet Take 81 mg by mouth daily.     Historical Provider, MD  diphenhydrAMINE (BENADRYL) 25 MG tablet Take 1 tablet (25 mg total) by mouth every 6 (six) hours. Patient not taking: Reported on  06/03/2014 01/11/14   Glendell Docker, NP  lisinopril (PRINIVIL,ZESTRIL) 10 MG tablet Take 2 tablets (20 mg total) by mouth daily. TAKE ONE TABLET BY MOUTH EVERY DAY Patient taking differently: Take 20 mg by mouth daily.  06/24/13   Wilber Oliphant, MD   BP 164/88 mmHg  Pulse 87  Temp(Src) 98.7 F (37.1 C)  Resp 18  SpO2 98% Physical Exam  Constitutional: She is oriented to person, place, and time. She appears well-developed and well-nourished. No distress.  HENT:  Head: Normocephalic and atraumatic.  There is swelling of the right earlobe and tracking up towards the helix. The ear canal appears normal without swelling and the TM is clear.  Neck: Normal range of motion. Neck supple.  Cardiovascular: Normal rate and regular rhythm.  Exam reveals no gallop and no friction rub.   No murmur heard. Pulmonary/Chest: Effort normal and breath sounds normal. No respiratory distress. She has no wheezes.  Abdominal: Soft. Bowel sounds are normal. She exhibits no distension. There is no tenderness.  Musculoskeletal: Normal range of motion.  Neurological: She is alert and oriented to person, place, and time. No cranial nerve deficit. She exhibits normal muscle tone. Coordination normal.  Skin: Skin is warm and dry. She is not diaphoretic.  Nursing note and vitals reviewed.   ED Course  Procedures (including critical care time) Labs Review Labs Reviewed - No data to display  Imaging Review No results found. I have personally reviewed and evaluated these images and lab results as part of my medical decision-making.   EKG Interpretation None      MDM   Final diagnoses:  None    This appears to be cellulitis of the right earlobe. This will be treated with ceftriaxone and she will be discharged with Keflex and Bactrim.    Veryl Speak, MD 03/04/15 1341

## 2015-03-04 NOTE — ED Notes (Signed)
Pt here for swelling to right ear. sts she was bitten by a spider. sts pain into head and blurred vision

## 2015-03-04 NOTE — ED Notes (Signed)
Pt requesting for CBG to be checked, pt believes her blood sugar may be low, CBG-144.

## 2015-05-24 ENCOUNTER — Emergency Department (HOSPITAL_COMMUNITY)
Admission: EM | Admit: 2015-05-24 | Discharge: 2015-05-25 | Disposition: A | Discharge status: Home / Self Care | Payer: No Typology Code available for payment source | Attending: Emergency Medicine | Admitting: Emergency Medicine

## 2015-05-24 ENCOUNTER — Emergency Department (HOSPITAL_COMMUNITY): Payer: No Typology Code available for payment source

## 2015-05-24 ENCOUNTER — Encounter (HOSPITAL_COMMUNITY): Payer: Self-pay | Admitting: Emergency Medicine

## 2015-05-24 DIAGNOSIS — Y92410 Unspecified street and highway as the place of occurrence of the external cause: Secondary | ICD-10-CM | POA: Insufficient documentation

## 2015-05-24 DIAGNOSIS — Z86718 Personal history of other venous thrombosis and embolism: Secondary | ICD-10-CM | POA: Diagnosis not present

## 2015-05-24 DIAGNOSIS — Z880 Allergy status to penicillin: Secondary | ICD-10-CM | POA: Insufficient documentation

## 2015-05-24 DIAGNOSIS — S199XXA Unspecified injury of neck, initial encounter: Secondary | ICD-10-CM | POA: Diagnosis not present

## 2015-05-24 DIAGNOSIS — F17210 Nicotine dependence, cigarettes, uncomplicated: Secondary | ICD-10-CM | POA: Insufficient documentation

## 2015-05-24 DIAGNOSIS — Y9389 Activity, other specified: Secondary | ICD-10-CM | POA: Diagnosis not present

## 2015-05-24 DIAGNOSIS — Z79899 Other long term (current) drug therapy: Secondary | ICD-10-CM | POA: Diagnosis not present

## 2015-05-24 DIAGNOSIS — Z792 Long term (current) use of antibiotics: Secondary | ICD-10-CM | POA: Diagnosis not present

## 2015-05-24 DIAGNOSIS — S0990XA Unspecified injury of head, initial encounter: Secondary | ICD-10-CM | POA: Diagnosis not present

## 2015-05-24 DIAGNOSIS — I10 Essential (primary) hypertension: Secondary | ICD-10-CM | POA: Insufficient documentation

## 2015-05-24 DIAGNOSIS — M542 Cervicalgia: Secondary | ICD-10-CM

## 2015-05-24 DIAGNOSIS — M549 Dorsalgia, unspecified: Secondary | ICD-10-CM

## 2015-05-24 DIAGNOSIS — E669 Obesity, unspecified: Secondary | ICD-10-CM | POA: Diagnosis not present

## 2015-05-24 DIAGNOSIS — Y998 Other external cause status: Secondary | ICD-10-CM | POA: Diagnosis not present

## 2015-05-24 DIAGNOSIS — M1712 Unilateral primary osteoarthritis, left knee: Secondary | ICD-10-CM | POA: Diagnosis not present

## 2015-05-24 DIAGNOSIS — M5489 Other dorsalgia: Secondary | ICD-10-CM

## 2015-05-24 DIAGNOSIS — R519 Headache, unspecified: Secondary | ICD-10-CM

## 2015-05-24 DIAGNOSIS — Z7982 Long term (current) use of aspirin: Secondary | ICD-10-CM | POA: Insufficient documentation

## 2015-05-24 DIAGNOSIS — Z87440 Personal history of urinary (tract) infections: Secondary | ICD-10-CM | POA: Insufficient documentation

## 2015-05-24 DIAGNOSIS — R51 Headache: Secondary | ICD-10-CM

## 2015-05-24 DIAGNOSIS — S3992XA Unspecified injury of lower back, initial encounter: Secondary | ICD-10-CM | POA: Insufficient documentation

## 2015-05-24 MED ORDER — ACETAMINOPHEN 500 MG PO TABS
1000.0000 mg | ORAL_TABLET | Freq: Once | ORAL | Status: AC
  Administered 2015-05-25: 1000 mg via ORAL
  Filled 2015-05-24: qty 2

## 2015-05-24 MED ORDER — NAPROXEN 250 MG PO TABS
500.0000 mg | ORAL_TABLET | Freq: Two times a day (BID) | ORAL | Status: DC
  Administered 2015-05-25: 500 mg via ORAL
  Filled 2015-05-24: qty 2

## 2015-05-24 MED ORDER — LISINOPRIL 20 MG PO TABS
20.0000 mg | ORAL_TABLET | Freq: Once | ORAL | Status: AC
  Administered 2015-05-25: 20 mg via ORAL
  Filled 2015-05-24: qty 1
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
## 2015-05-24 NOTE — ED Notes (Signed)
Patient transported to CT 
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
## 2015-05-25 NOTE — Discharge Instructions (Signed)

## 2015-06-22 ENCOUNTER — Ambulatory Visit (INDEPENDENT_AMBULATORY_CARE_PROVIDER_SITE_OTHER): Payer: Medicare Other | Admitting: Obstetrics

## 2015-06-22 ENCOUNTER — Encounter: Payer: Self-pay | Admitting: Obstetrics

## 2015-06-22 VITALS — BP 144/88 | HR 88 | Temp 98.7°F | Wt 184.0 lb

## 2015-06-22 DIAGNOSIS — Z124 Encounter for screening for malignant neoplasm of cervix: Secondary | ICD-10-CM | POA: Diagnosis not present

## 2015-06-22 DIAGNOSIS — Z01419 Encounter for gynecological examination (general) (routine) without abnormal findings: Secondary | ICD-10-CM

## 2015-06-22 DIAGNOSIS — Z1389 Encounter for screening for other disorder: Secondary | ICD-10-CM

## 2015-06-22 DIAGNOSIS — Z202 Contact with and (suspected) exposure to infections with a predominantly sexual mode of transmission: Secondary | ICD-10-CM

## 2015-06-22 LAB — POCT URINALYSIS DIPSTICK
Bilirubin, UA: NEGATIVE
Blood, UA: NEGATIVE
Glucose, UA: NEGATIVE
KETONES UA: NEGATIVE
Leukocytes, UA: NEGATIVE
Nitrite, UA: NEGATIVE
Protein, UA: NEGATIVE
Spec Grav, UA: 1.015
UROBILINOGEN UA: NEGATIVE
pH, UA: 6

## 2015-06-23 LAB — RPR: RPR Ser Ql: NONREACTIVE

## 2015-06-23 LAB — HEPATITIS B SURFACE ANTIGEN: HEP B S AG: NEGATIVE

## 2015-06-23 LAB — HIV ANTIBODY (ROUTINE TESTING W REFLEX): HIV SCREEN 4TH GENERATION: NONREACTIVE

## 2015-06-23 LAB — HEPATITIS C ANTIBODY: Hep C Virus Ab: <0.1 s/co ratio (ref 0.0–0.9)

## 2015-06-24 LAB — PAP IG (IMAGE GUIDED): PAP Smear Comment: 0

## 2015-06-25 ENCOUNTER — Encounter: Payer: Self-pay | Admitting: Obstetrics

## 2015-06-25 NOTE — Progress Notes (Signed)
Subjective:        April Mathis is a 65 y.o. female here for a routine exam.  Current complaints: None.    Personal health questionnaire:  Is patient Ashkenazi Jewish, have a family history of breast and/or ovarian cancer: no Is there a family history of uterine cancer diagnosed at age < 68, gastrointestinal cancer, urinary tract cancer, family member who is a Field seismologist syndrome-associated carrier: no Is the patient overweight and hypertensive, family history of diabetes, personal history of gestational diabetes, preeclampsia or PCOS: no Is patient over 3, have PCOS,  family history of premature CHD under age 28, diabetes, smoke, have hypertension or peripheral artery disease:  no At any time, has a partner hit, kicked or otherwise hurt or frightened you?: no Over the past 2 weeks, have you felt down, depressed or hopeless?: no Over the past 2 weeks, have you felt little interest or pleasure in doing things?:no   Gynecologic History No LMP recorded. Patient is postmenopausal. Contraception: post menopausal status Last Pap: unknown. Results were: unknown Last mammogram: 2014. Results were: normal  Obstetric History OB History  Gravida Para Term Preterm AB SAB TAB Ectopic Multiple Living  6 4 4  2 1 1   4     # Outcome Date GA Lbr Len/2nd Weight Sex Delivery Anes PTL Lv  6 SAB           5 Term           4 Term           3 Term           2 Term           1 TAB               Past Medical History  Diagnosis Date  . Hypertension   . Bilateral leg edema   . Obesity   . Smoking   . Knee pain   . Left knee DJD 2002    MRI done in 2002  . History of recurrent UTIs   . Varicose vein   . DVT of lower extremity (deep venous thrombosis) [facility])     Past Surgical History  Procedure Laterality Date  . Knee arthroscopy    . Tubal ligation       Current outpatient prescriptions:  .  ibuprofen (ADVIL,MOTRIN) 200 MG tablet, Take 400 mg by mouth every 6 (six) hours as needed for  moderate pain., Disp: , Rfl:  .  lisinopril (PRINIVIL,ZESTRIL) 10 MG tablet, Take 2 tablets (20 mg total) by mouth daily. TAKE ONE TABLET BY MOUTH EVERY DAY (Patient taking differently: Take 20 mg by mouth daily. ), Disp: 180 tablet, Rfl: 0 .  Oxycodone HCl 10 MG TABS, Take 1 tablet by mouth 3 (three) times daily., Disp: , Rfl: 0 .  aspirin 81 MG tablet, Take 81 mg by mouth daily. Reported on 06/22/2015, Disp: , Rfl:  Allergies  Allergen Reactions  . Hctz [Hydrochlorothiazide]     Pt states it makes her BP too low in 2006 & 2007 while hospitalized   . Penicillins Hives    Social History  Substance Use Topics  . Smoking status: Current Every Day Smoker -- 1.00 packs/day for 20 years    Types: Cigarettes  . Smokeless tobacco: Never Used     Comment: 1/2 PPD  . Alcohol Use: No    Family History  Problem Relation Age of Onset  . Anesthesia problems Neg Hx   .  Cancer Mother     LUNG      Review of Systems  Constitutional: negative for fatigue and weight loss Respiratory: negative for cough and wheezing Cardiovascular: negative for chest pain, fatigue and palpitations Gastrointestinal: negative for abdominal pain and change in bowel habits Musculoskeletal:negative for myalgias Neurological: negative for gait problems and tremors Behavioral/Psych: negative for abusive relationship, depression Endocrine: negative for temperature intolerance   Genitourinary:negative for abnormal menstrual periods, genital lesions, hot flashes, sexual problems and vaginal discharge Integument/breast: negative for breast lump, breast tenderness, nipple discharge and skin lesion(s)    Objective:       BP 144/88 mmHg  Pulse 88  Temp(Src) 98.7 F (37.1 C)  Wt 184 lb (83.462 kg) General:   alert  Skin:   no rash or abnormalities  Lungs:   clear to auscultation bilaterally  Heart:   regular rate and rhythm, S1, S2 normal, no murmur, click, rub or gallop  Breasts:   normal without suspicious masses,  skin or nipple changes or axillary nodes  Abdomen:  normal findings: no organomegaly, soft, non-tender and no hernia  Pelvis:  External genitalia: normal general appearance Urinary system: urethral meatus normal and bladder without fullness, nontender Vaginal: normal without tenderness, induration or masses Cervix: normal appearance Adnexa: normal bimanual exam Uterus: anteverted and non-tender, normal size   Lab Review Urine pregnancy test Labs reviewed yes Radiologic studies reviewed yes    Assessment:    Healthy female exam.    Plan:    Education reviewed: calcium supplements, depression evaluation, low fat, low cholesterol diet, self breast exams and weight bearing exercise. Mammogram ordered. Follow up in: 1 year.   No orders of the defined types were placed in this encounter.   Orders Placed This Encounter  Procedures  . HIV antibody  . Hepatitis B surface antigen  . RPR  . Hepatitis C antibody  . POCT urinalysis dipstick

## 2015-06-26 LAB — NUSWAB VG+, CANDIDA 6SP
CANDIDA ALBICANS, NAA: NEGATIVE
CANDIDA GLABRATA, NAA: NEGATIVE
CANDIDA LUSITANIAE, NAA: NEGATIVE
CHLAMYDIA TRACHOMATIS, NAA: NEGATIVE
Candida krusei, NAA: NEGATIVE
Candida parapsilosis, NAA: NEGATIVE
Candida tropicalis, NAA: NEGATIVE
NEISSERIA GONORRHOEAE, NAA: NEGATIVE
Trich vag by NAA: NEGATIVE

## 2015-10-10 ENCOUNTER — Encounter: Payer: Self-pay | Admitting: *Deleted

## 2015-10-19 ENCOUNTER — Emergency Department (HOSPITAL_COMMUNITY)
Admission: EM | Admit: 2015-10-19 | Discharge: 2015-10-19 | Disposition: A | Discharge status: Home / Self Care | Payer: Medicare Other | Attending: Emergency Medicine | Admitting: Emergency Medicine

## 2015-10-19 ENCOUNTER — Encounter (HOSPITAL_COMMUNITY): Payer: Self-pay | Admitting: Emergency Medicine

## 2015-10-19 ENCOUNTER — Telehealth (HOSPITAL_BASED_OUTPATIENT_CLINIC_OR_DEPARTMENT_OTHER): Payer: Self-pay | Admitting: Emergency Medicine

## 2015-10-19 ENCOUNTER — Telehealth: Payer: Self-pay | Admitting: *Deleted

## 2015-10-19 DIAGNOSIS — Z7982 Long term (current) use of aspirin: Secondary | ICD-10-CM | POA: Insufficient documentation

## 2015-10-19 DIAGNOSIS — F17210 Nicotine dependence, cigarettes, uncomplicated: Secondary | ICD-10-CM | POA: Diagnosis not present

## 2015-10-19 DIAGNOSIS — I10 Essential (primary) hypertension: Secondary | ICD-10-CM | POA: Diagnosis not present

## 2015-10-19 DIAGNOSIS — K0889 Other specified disorders of teeth and supporting structures: Secondary | ICD-10-CM | POA: Insufficient documentation

## 2015-10-19 DIAGNOSIS — K029 Dental caries, unspecified: Secondary | ICD-10-CM | POA: Diagnosis not present

## 2015-10-19 MED ORDER — CLINDAMYCIN HCL 300 MG PO CAPS: 300.0000 mg | ORAL_CAPSULE | Freq: Three times a day (TID) | ORAL | 0 refills | Status: AC

## 2015-10-19 MED ORDER — TRAMADOL HCL 50 MG PO TABS
50.0000 mg | ORAL_TABLET | Freq: Once | ORAL | Status: AC
  Administered 2015-10-19: 50 mg via ORAL
  Filled 2015-10-19: qty 1

## 2015-10-19 MED ORDER — IBUPROFEN 800 MG PO TABS: 800.0000 mg | ORAL_TABLET | Freq: Three times a day (TID) | ORAL | 0 refills | Status: DC

## 2015-10-19 MED ORDER — TRAMADOL HCL 50 MG PO TABS: 50.0000 mg | ORAL_TABLET | Freq: Four times a day (QID) | ORAL | 0 refills | Status: DC | PRN

## 2015-10-19 MED ORDER — IBUPROFEN 800 MG PO TABS
800.0000 mg | ORAL_TABLET | Freq: Once | ORAL | Status: AC
  Administered 2015-10-19: 800 mg via ORAL
  Filled 2015-10-19: qty 1
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
## 2015-10-19 NOTE — Telephone Encounter (Signed)
Pt called stating she misplaced Rx from last visit.  EDCM reviewed chart to find that pt had spoken with Flow Manager who called in antibiotic and ibuprofen Rx and explained why she could not call in tramadol Rx.  EDCM asked opt if she remembered that conversation, pt admitted that she remembered the conversation.  EDCM advised that she may return to ED for a print out of Rx.  Pt states she will follow given advice.
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
## 2015-10-19 NOTE — ED Provider Notes (Signed)
[place] DEPT Provider Note   CSN: [id number] Arrival date & time: 10/19/15  0608  History   Chief Complaint Chief Complaint  Patient presents with  . Dental Pain    HPI April Mathis is a 65 y.o. female.  HPI 65 y.o. female with a hx of HTN presents to the Emergency Department today complaining of dental pain x 3-4 weeks. Pt states that she hurts along the left lower mandible due to cavity. Supposed to see dentist 2 weeks ago, but she missed thea appointment due to being busy. Notes pain 8/10. Able to tolerate PO. No fevers. No N/V. Notes Ibuprofen with minimal relief. Pt also notes "bump" on right shoulder that she has had x 1 year. No pain. No drainage. States that it is squishy. No others symptoms noted.   Past Medical History:  Diagnosis Date  . Bilateral leg edema   . DVT of lower extremity (deep venous thrombosis) ([place])   . History of recurrent UTIs   . Hypertension   . Knee pain   . Left knee DJD 2002   MRI done in 2002  . Obesity   . Smoking   . Varicose vein     Patient Active Problem List   Diagnosis Date Noted  . Vaginitis 06/24/2013  . Stressful life event affecting family 02/20/2013  . DUB (dysfunctional uterine bleeding) 12/05/2011  . Varicose veins of lower extremities with other complications 0000000  . Preventative health care 08/11/2010  . PREDIABETES 08/17/2009  . DEGENERATIVE JOINT DISEASE, KNEE 12/28/2005  . TOBACCO ABUSE 12/01/2005  . HYPERTENSION 12/01/2005    Past Surgical History:  Procedure Laterality Date  . KNEE ARTHROSCOPY    . TUBAL LIGATION      OB History    Gravida Para Term Preterm AB Living   6 4 4   2 4    SAB TAB Ectopic Multiple Live Births   1 1             Home Medications    Prior to Admission medications   Medication Sig Start Date End Date Taking? Authorizing Provider  aspirin 81 MG tablet Take 81 mg by mouth daily. Reported on 06/22/2015    Historical Provider, MD  ibuprofen (ADVIL,MOTRIN) 200 MG  tablet Take 400 mg by mouth every 6 (six) hours as needed for moderate pain.    Historical Provider, MD  lisinopril (PRINIVIL,ZESTRIL) 10 MG tablet Take 2 tablets (20 mg total) by mouth daily. TAKE ONE TABLET BY MOUTH EVERY DAY Patient taking differently: Take 20 mg by mouth daily.  06/24/13   Wilber Oliphant, MD  Oxycodone HCl 10 MG TABS Take 1 tablet by mouth 3 (three) times daily. 02/23/15   Historical Provider, MD    Family History Family History  Problem Relation Age of Onset  . Cancer Mother     LUNG  . Anesthesia problems Neg Hx     Social History Social History  Substance Use Topics  . Smoking status: Current Every Day Smoker    Packs/day: 1.00    Years: 20.00    Types: Cigarettes  . Smokeless tobacco: Never Used     Comment: 1/2 PPD  . Alcohol use No     Allergies   Hctz [hydrochlorothiazide] and Penicillins   Review of Systems Review of Systems  Constitutional: Negative for fever.  HENT: Positive for dental problem. Negative for sore throat, trouble swallowing and voice change.   Gastrointestinal: Negative for nausea and vomiting.   Physical Exam  Updated Vital Signs There were no vitals taken for this visit.  Physical Exam  Constitutional: She is oriented to person, place, and time. Vital signs are normal. She appears well-developed and well-nourished.  Phonating well  HENT:  Head: Normocephalic.  Right Ear: Hearing normal.  Left Ear: Hearing normal.  Mouth/Throat: Oropharynx is clear and moist and mucous membranes are normal. No trismus in the jaw. Abnormal dentition. Dental caries present. No dental abscesses or uvula swelling. No oropharyngeal exudate, posterior oropharyngeal edema, posterior oropharyngeal erythema or tonsillar abscesses.  No Trismus. No Uvula Deviation. No Ludwigs. Full ROM of neck without pain. Poor dentition noted on left lower mandible. No abscess or facial swelling.   Eyes: Conjunctivae and EOM are normal. Pupils are equal, round, and  reactive to light.  Neck: Normal range of motion. Neck supple.  Cardiovascular: Normal rate and regular rhythm.   Pulmonary/Chest: Effort normal.  Neurological: She is alert and oriented to person, place, and time.  Skin: Skin is warm and dry.  Psychiatric: She has a normal mood and affect. Her speech is normal and behavior is normal. Thought content normal.  Nursing note and vitals reviewed.  ED Treatments / Results  Labs (all labs ordered are listed, but only abnormal results are displayed) Labs Reviewed - No data to display  EKG  EKG Interpretation None      Radiology No results found.  Procedures Procedures (including critical care time)  Medications Ordered in ED Medications  traMADol (ULTRAM) tablet 50 mg (not administered)    Initial Impression / Assessment and Plan / ED Course  I have reviewed the triage vital signs and the nursing notes.  Pertinent labs & imaging results that were available during my care of the patient were reviewed by me and considered in my medical decision making (see chart for details).  Clinical Course   Final Clinical Impressions(s) / ED Diagnoses  I have reviewed the relevant previous healthcare records. I obtained HPI from historian.  ED Course:  Assessment: Dental pain associated with dental cary but no signs or symptoms of dental abscess with patient afebrile, non toxic appearing and swallowing secretions well. Exam unconcerning for Ludwig's angina or other deep tissue infection in neck. Will prescribe antibiotics at this time due to exposed gingiva from severe dental caries. Will treat with pain medication.  I gave patient referral to dentist and stressed the importance of dental follow up for ultimate management of dental pain. Patient voices understanding and is agreeable to plan.  Disposition/Plan:  DC Home Additional Verbal discharge instructions given and discussed with patient.  Pt Instructed to f/u with PCP in the next week  for evaluation and treatment of symptoms. Return precautions given Pt acknowledges and agrees with plan  Supervising Physician Quintella Reichert, MD   Final diagnoses:  Dental caries  Pain, dental    New Prescriptions New Prescriptions   No medications on file     Shary Decamp, PA-C 10/19/15 [place], MD 10/19/15 [phone]

## 2015-10-22 ENCOUNTER — Emergency Department (HOSPITAL_COMMUNITY)
Admission: EM | Admit: 2015-10-22 | Discharge: 2015-10-22 | Disposition: A | Discharge status: Home / Self Care | Payer: Medicare Other | Attending: Emergency Medicine | Admitting: Emergency Medicine

## 2015-10-22 ENCOUNTER — Encounter (HOSPITAL_COMMUNITY): Payer: Self-pay

## 2015-10-22 DIAGNOSIS — I10 Essential (primary) hypertension: Secondary | ICD-10-CM | POA: Insufficient documentation

## 2015-10-22 DIAGNOSIS — K089 Disorder of teeth and supporting structures, unspecified: Secondary | ICD-10-CM

## 2015-10-22 DIAGNOSIS — G8929 Other chronic pain: Secondary | ICD-10-CM | POA: Diagnosis not present

## 2015-10-22 DIAGNOSIS — Z760 Encounter for issue of repeat prescription: Secondary | ICD-10-CM | POA: Diagnosis present

## 2015-10-22 DIAGNOSIS — K0889 Other specified disorders of teeth and supporting structures: Secondary | ICD-10-CM | POA: Insufficient documentation

## 2015-10-22 DIAGNOSIS — Z7982 Long term (current) use of aspirin: Secondary | ICD-10-CM | POA: Insufficient documentation

## 2015-10-22 DIAGNOSIS — F17210 Nicotine dependence, cigarettes, uncomplicated: Secondary | ICD-10-CM | POA: Diagnosis not present

## 2015-10-22 DIAGNOSIS — Z79899 Other long term (current) drug therapy: Secondary | ICD-10-CM | POA: Diagnosis not present

## 2015-10-22 NOTE — ED Triage Notes (Signed)
Patient here requesting med refill for dental pain, seen on Tuesday for same.

## 2015-10-22 NOTE — ED Provider Notes (Signed)
[facility] DEPT Provider Note   CSN: [id number] Arrival date & time: 10/22/15  1052   By signing my name below, I, April Mathis, attest that this documentation has been prepared under the direction and in the presence of April Chapel, MD . Electronically Signed: Evelene Mathis, Scribe. 10/22/2015. 11:26 AM.   History   Chief Complaint Chief Complaint  Patient presents with  . Medication Refill    The history is provided by the patient. No language interpreter was used.    HPI Comments:  April Mathis is a 65 y.o. female who presents to the Emergency Department for a medication refill. Pt was seen in the ED on 10/19/15 for  left lower dental pain. She states she was discharged with antibiotics, ibuprofen,  and narcotic pain meds. Pt states she lost the prescriptions on the bus. She was able to get the antibiotics and ibuprofen called into the pharmacy but  was advised to come to the ED in order to receive another Rx for narcotics. At this time pt has no acute complaints.     Past Medical History:  Diagnosis Date  . Bilateral leg edema   . DVT of lower extremity (deep venous thrombosis) ([place])   . History of recurrent UTIs   . Hypertension   . Knee pain   . Left knee DJD 2002   MRI done in 2002  . Obesity   . Smoking   . Varicose vein     Patient Active Problem List   Diagnosis Date Noted  . Vaginitis 06/24/2013  . Stressful life event affecting family 02/20/2013  . DUB (dysfunctional uterine bleeding) 12/05/2011  . Varicose veins of lower extremities with other complications 0000000  . Preventative health care 08/11/2010  . PREDIABETES 08/17/2009  . DEGENERATIVE JOINT DISEASE, KNEE 12/28/2005  . TOBACCO ABUSE 12/01/2005  . HYPERTENSION 12/01/2005    Past Surgical History:  Procedure Laterality Date  . KNEE ARTHROSCOPY    . TUBAL LIGATION      OB History    Gravida Para Term Preterm AB Living   6 4 4   2 4    SAB TAB Ectopic Multiple Live Births   1 1              Home Medications    Prior to Admission medications   Medication Sig Start Date End Date Taking? Authorizing Provider  aspirin 81 MG tablet Take 81 mg by mouth daily. Reported on 06/22/2015    Historical Provider, MD  clindamycin (CLEOCIN) 300 MG capsule Take 1 capsule (300 mg total) by mouth 3 (three) times daily. 10/19/15 10/26/15  Shary Decamp, PA-C  ibuprofen (ADVIL,MOTRIN) 800 MG tablet Take 1 tablet (800 mg total) by mouth 3 (three) times daily. 10/19/15   Shary Decamp, PA-C  lisinopril (PRINIVIL,ZESTRIL) 10 MG tablet Take 2 tablets (20 mg total) by mouth daily. TAKE ONE TABLET BY MOUTH EVERY DAY Patient taking differently: Take 20 mg by mouth daily.  06/24/13   Wilber Oliphant, MD  Oxycodone HCl 10 MG TABS Take 1 tablet by mouth 3 (three) times daily. 02/23/15   Historical Provider, MD  traMADol (ULTRAM) 50 MG tablet Take 1 tablet (50 mg total) by mouth every 6 (six) hours as needed. 10/19/15   Shary Decamp, PA-C    Family History Family History  Problem Relation Age of Onset  . Cancer Mother     LUNG  . Anesthesia problems Neg Hx     Social History Social History  Substance  Use Topics  . Smoking status: Current Every Day Smoker    Packs/day: 1.00    Years: 20.00    Types: Cigarettes  . Smokeless tobacco: Never Used     Comment: 1/2 PPD  . Alcohol use No     Allergies   Hctz [hydrochlorothiazide] and Penicillins   Review of Systems Review of Systems  Constitutional: Negative for fever.       +Medication refill   HENT: Positive for dental problem.     Physical Exam Updated Vital Signs BP 160/99   Pulse 83   Temp 98.1 F (36.7 C) (Oral)   Resp 18   SpO2 99%   Physical Exam  Constitutional: She appears well-developed and well-nourished.  HENT:  Head: Normocephalic and atraumatic.  2nd pre-molar with tenderness  Eyes: Conjunctivae are normal. Right eye exhibits no discharge. Left eye exhibits no discharge.  Pulmonary/Chest: Effort normal. No respiratory  distress.  Neurological: She is alert. Coordination normal.  Skin: Skin is warm and dry. No rash noted. She is not diaphoretic. No erythema.  Psychiatric: She has a normal mood and affect.  Nursing note and vitals reviewed.   ED Treatments / Results  DIAGNOSTIC STUDIES:  Oxygen Saturation is 99% on RA, normal by my interpretation.    COORDINATION OF CARE:  11:21 AM Discussed treatment plan with pt at bedside and pt agreed to plan.  Labs (all labs ordered are listed, but only abnormal results are displayed) Labs Reviewed - No data to display   Radiology No results found.  Procedures Procedures (including critical care time)  Medications Ordered in ED Medications - No data to display   Initial Impression / Assessment and Plan / ED Course  I have reviewed the triage vital signs and the nursing notes.  Pertinent labs & imaging results that were available during my care of the patient were reviewed by me and considered in my medical decision making (see chart for details).  Clinical Course    Patient with dentalgia and returns today for refill of medication. Medication is a controlled substance and she was instructed this was inappropriate given her exam being so normal appaering - suggestive of palpitis. Will discharge with dental resources for follow up.   No abscess requiring immediate incision and drainage. Exam not concerning for Ludwig's angina or pharyngeal abscess. Discussed return precautions. Pt safe for discharge.   Final Clinical Impressions(s) / ED Diagnoses   Final diagnoses:  Chronic dental pain    New Prescriptions Discharge Medication List as of 10/22/2015 11:28 AM     I personally performed the services described in this documentation, which was scribed in my presence. The recorded information has been reviewed and is accurate.    Pt informed she would not get opiates!    April Chapel, MD 10/22/15 1249

## 2015-10-22 NOTE — Discharge Instructions (Addendum)
Return for increased pain, swelling or fevers See your dentist this week  RESOURCE GUIDE  Dental Problems  Patients with Medicaid: [facility] [phone] [address] Phone:  [phone]                                                  Phone:  [phone]  If unable to pay or uninsured, contact:  [facility] or [facility]. to become qualified for the adult dental clinic.  Chronic Pain Problems Contact [facility]  [phone] Patients need to be referred by their primary care doctor.  Insufficient Money for Medicine Contact [employer]:  call "211" or [place] [phone].  No Primary Care Doctor Call [facility]  [phone] Other agencies that provide inexpensive medical care    [place]  [phone]    [facility]  [place]  [phone]    [facility]  [phone]    [facility]  [place]  [place]  [phone] [place]   [phone] (emergency services [phone])  Substance Abuse [facility]  [phone] [facility] [phone] [facility] [place] [phone] [facility] [phone] Residential & Outpatient Substance Abuse Program  [phone]  Abuse/Neglect [place] [phone] [place] [phone] (After Hours)  Emergency [place] [phone]  [place] [facility] [place] [phone] [place] [phone]  MRSA Hotline #:   (816) 741-2566    [facility] Dept. [address]      [place] Phone:  [phone]                                   Phone:  [phone]  Phone:  [place] Phone:  [place] [phone] (After Hours)

## 2015-10-22 NOTE — ED Triage Notes (Signed)
Patient complains of losing pain med RX after being seen for dental pain Tuesday

## 2015-10-22 NOTE — ED Notes (Signed)
Pt comfortable with discharge and follow up instructions. Pt declines wheelchair, escorted to waiting area by this RN. Rx x0 

## 2015-10-26 ENCOUNTER — Inpatient Hospital Stay (HOSPITAL_COMMUNITY)
Admission: AD | Admit: 2015-10-26 | Discharge: 2015-10-26 | Disposition: A | Discharge status: Home / Self Care | Payer: Medicare Other | Source: Ambulatory Visit | Attending: Obstetrics and Gynecology | Admitting: Obstetrics and Gynecology

## 2015-10-26 ENCOUNTER — Encounter (HOSPITAL_COMMUNITY): Payer: Self-pay | Admitting: *Deleted

## 2015-10-26 DIAGNOSIS — Z7982 Long term (current) use of aspirin: Secondary | ICD-10-CM | POA: Insufficient documentation

## 2015-10-26 DIAGNOSIS — Z86718 Personal history of other venous thrombosis and embolism: Secondary | ICD-10-CM | POA: Insufficient documentation

## 2015-10-26 DIAGNOSIS — F17210 Nicotine dependence, cigarettes, uncomplicated: Secondary | ICD-10-CM | POA: Diagnosis not present

## 2015-10-26 DIAGNOSIS — Z113 Encounter for screening for infections with a predominantly sexual mode of transmission: Secondary | ICD-10-CM

## 2015-10-26 DIAGNOSIS — I10 Essential (primary) hypertension: Secondary | ICD-10-CM | POA: Diagnosis not present

## 2015-10-26 DIAGNOSIS — N898 Other specified noninflammatory disorders of vagina: Secondary | ICD-10-CM | POA: Insufficient documentation

## 2015-10-26 DIAGNOSIS — Z880 Allergy status to penicillin: Secondary | ICD-10-CM | POA: Diagnosis not present

## 2015-10-26 DIAGNOSIS — Z202 Contact with and (suspected) exposure to infections with a predominantly sexual mode of transmission: Secondary | ICD-10-CM | POA: Diagnosis not present

## 2015-10-26 LAB — URINALYSIS, ROUTINE W REFLEX MICROSCOPIC
Bilirubin Urine: NEGATIVE
Glucose, UA: NEGATIVE mg/dL
Hgb urine dipstick: NEGATIVE
Ketones, ur: NEGATIVE mg/dL
LEUKOCYTES UA: NEGATIVE
NITRITE: NEGATIVE
PH: 6 (ref 5.0–8.0)
Protein, ur: NEGATIVE mg/dL

## 2015-10-26 LAB — WET PREP, GENITAL
CLUE CELLS WET PREP: NONE SEEN
SPERM: NONE SEEN
TRICH WET PREP: NONE SEEN
YEAST WET PREP: NONE SEEN

## 2015-10-26 LAB — POCT PREGNANCY, URINE: Preg Test, Ur: NEGATIVE

## 2015-10-26 MED ORDER — CEFTRIAXONE SODIUM 250 MG IJ SOLR
250.0000 mg | Freq: Once | INTRAMUSCULAR | Status: AC
  Administered 2015-10-26: 250 mg via INTRAMUSCULAR
  Filled 2015-10-26: qty 250

## 2015-10-26 MED ORDER — AZITHROMYCIN 250 MG PO TABS
1000.0000 mg | ORAL_TABLET | Freq: Once | ORAL | Status: AC
  Administered 2015-10-26: 1000 mg via ORAL
  Filled 2015-10-26: qty 4

## 2015-10-26 NOTE — MAU Note (Signed)
Patient states that she has been sexually active with a new partner in the last few weeks and her partner told her last week that he had an STD.  She is unsure of what one. Also complaining of having diarrhea on and off for last month along with some nausea.  Patient is concerned she may be pregnant.  Patient states LMP was at 65years old.

## 2015-10-26 NOTE — Discharge Instructions (Signed)
Sexually Transmitted Disease °A sexually transmitted disease (STD) is a disease or infection that may be passed (transmitted) from person to person, usually during sexual activity. This may happen by way of saliva, semen, blood, vaginal mucus, or urine. Common STDs include: °· Gonorrhea. °· Chlamydia. °· Syphilis. °· HIV and AIDS. °· Genital herpes. °· Hepatitis B and C. °· Trichomonas. °· Human papillomavirus (HPV). °· Pubic lice. °· Scabies. °· Mites. °· Bacterial vaginosis. °WHAT ARE CAUSES OF STDs? °An STD may be caused by bacteria, a virus, or parasites. STDs are often transmitted during sexual activity if one person is infected. However, they may also be transmitted through nonsexual means. STDs may be transmitted after:  °· Sexual intercourse with an infected person. °· Sharing sex toys with an infected person. °· Sharing needles with an infected person or using unclean piercing or tattoo needles. °· Having intimate contact with the genitals, mouth, or rectal areas of an infected person. °· Exposure to infected fluids during birth. °WHAT ARE THE SIGNS AND SYMPTOMS OF STDs? °Different STDs have different symptoms. Some people may not have any symptoms. If symptoms are present, they may include: °· Painful or bloody urination. °· Pain in the pelvis, abdomen, vagina, anus, throat, or eyes. °· A skin rash, itching, or irritation. °· Growths, ulcerations, blisters, or sores in the genital and anal areas. °· Abnormal vaginal discharge with or without bad odor. °· Penile discharge in men. °· Fever. °· Pain or bleeding during sexual intercourse. °· Swollen glands in the groin area. °· Yellow skin and eyes (jaundice). This is seen with hepatitis. °· Swollen testicles. °· Infertility. °· Sores and blisters in the mouth. °HOW ARE STDs DIAGNOSED? °To make a diagnosis, your health care provider may: °· Take a medical history. °· Perform a physical exam. °· Take a sample of any discharge to examine. °· Swab the throat,  cervix, opening to the penis, rectum, or vagina for testing. °· Test a sample of your first morning urine. °· Perform blood tests. °· Perform a Pap test, if this applies. °· Perform a colposcopy. °· Perform a laparoscopy. °HOW ARE STDs TREATED? °Treatment depends on the STD. Some STDs may be treated but not cured. °· Chlamydia, gonorrhea, trichomonas, and syphilis can be cured with antibiotic medicine. °· Genital herpes, hepatitis, and HIV can be treated, but not cured, with prescribed medicines. The medicines lessen symptoms. °· Genital warts from HPV can be treated with medicine or by freezing, burning (electrocautery), or surgery. Warts may come back. °· HPV cannot be cured with medicine or surgery. However, abnormal areas may be removed from the cervix, vagina, or vulva. °· If your diagnosis is confirmed, your recent sexual partners need treatment. This is true even if they are symptom-free or have a negative culture or evaluation. They should not have sex until their health care providers say it is okay. °· Your health care provider may test you for infection again 3 months after treatment. °HOW CAN I REDUCE MY RISK OF GETTING AN STD? °Take these steps to reduce your risk of getting an STD: °· Use latex condoms, dental dams, and water-soluble lubricants during sexual activity. Do not use petroleum jelly or oils. °· Avoid having multiple sex partners. °· Do not have sex with someone who has other sex partners °· Do not have sex with anyone you do not know or who is at high risk for an STD. °· Avoid risky sex practices that can break your skin. °· Do not have sex   if you have open sores on your mouth or skin. °· Avoid drinking too much alcohol or taking illegal drugs. Alcohol and drugs can affect your judgment and put you in a vulnerable position. °· Avoid engaging in oral and anal sex acts. °· Get vaccinated for HPV and hepatitis. If you have not received these vaccines in the past, talk to your health care  provider about whether one or both might be right for you. °· If you are at risk of being infected with HIV, it is recommended that you take a prescription medicine daily to prevent HIV infection. This is called pre-exposure prophylaxis (PrEP). You are considered at risk if: °¨ You are a man who has sex with other men (MSM). °¨ You are a heterosexual man or woman and are sexually active with more than one partner. °¨ You take drugs by injection. °¨ You are sexually active with a partner who has HIV. °· Talk with your health care provider about whether you are at high risk of being infected with HIV. If you choose to begin PrEP, you should first be tested for HIV. You should then be tested every 3 months for as long as you are taking PrEP. °WHAT SHOULD I DO IF I THINK I HAVE AN STD? °· See your health care provider. °· Tell your sexual partner(s). They should be tested and treated for any STDs. °· Do not have sex until your health care provider says it is okay. °WHEN SHOULD I GET IMMEDIATE MEDICAL CARE? °Contact your health care provider right away if:  °· You have severe abdominal pain. °· You are a man and notice swelling or pain in your testicles. °· You are a woman and notice swelling or pain in your vagina. °  °This information is not intended to replace advice given to you by your health care provider. Make sure you discuss any questions you have with your health care provider. °  °Document Released: 03/26/2002 Document Revised: 01/24/2014 Document Reviewed: 07/24/2012 °Elsevier Interactive Patient Education ©2016 [employer] Inc[employer] ° °

## 2015-10-26 NOTE — MAU Provider Note (Signed)
History     CSN: [id number]  Arrival date and time: 10/26/15 1222   First Provider Initiated Contact with Patient 10/26/15 1331      No chief complaint on file.  HPI   April Mathis is a 65 y.o. female [phone] non pregnant here with vaginal discharge and is concerned she has an STD.  Her partner told her he tested positive but was not able to tell her what he was positive for. He received treatment.   Her discharge is yellow/green with occasional odor. Symptoms have been present X 1 week.   OB History    Gravida Para Term Preterm AB Living   6 4 4  0 2 4   SAB TAB Ectopic Multiple Live Births   1 1 0 0 4      Past Medical History:  Diagnosis Date  . Bilateral leg edema   . DVT of lower extremity (deep venous thrombosis) ([place])   . History of recurrent UTIs   . Hypertension   . Knee pain   . Left knee DJD 2002   MRI done in 2002  . Obesity   . Smoking   . Varicose vein     Past Surgical History:  Procedure Laterality Date  . KNEE ARTHROSCOPY    . TUBAL LIGATION      Family History  Problem Relation Age of Onset  . Cancer Mother     LUNG  . Anesthesia problems Neg Hx     Social History  Substance Use Topics  . Smoking status: Current Every Day Smoker    Packs/day: 1.00    Years: 20.00    Types: Cigarettes  . Smokeless tobacco: Never Used     Comment: 1/2 PPD  . Alcohol use No    Allergies:  Allergies  Allergen Reactions  . Hctz [Hydrochlorothiazide]     Pt states it makes her BP too low in 2006 & 2007 while hospitalized   . Penicillins Hives    Prescriptions Prior to Admission  Medication Sig Dispense Refill Last Dose  . aspirin 81 MG tablet Take 81 mg by mouth daily. Reported on 06/22/2015   Not Taking  . clindamycin (CLEOCIN) 300 MG capsule Take 1 capsule (300 mg total) by mouth 3 (three) times daily. 21 capsule 0   . ibuprofen (ADVIL,MOTRIN) 800 MG tablet Take 1 tablet (800 mg total) by mouth 3 (three) times daily. 21 tablet 0   . lisinopril  (PRINIVIL,ZESTRIL) 10 MG tablet Take 2 tablets (20 mg total) by mouth daily. TAKE ONE TABLET BY MOUTH EVERY DAY (Patient taking differently: Take 20 mg by mouth daily. ) 180 tablet 0 Taking  . Oxycodone HCl 10 MG TABS Take 1 tablet by mouth 3 (three) times daily.  0 Taking  . traMADol (ULTRAM) 50 MG tablet Take 1 tablet (50 mg total) by mouth every 6 (six) hours as needed. 9 tablet 0    Results for orders placed or performed during the hospital encounter of 10/26/15 (from the past 48 hour(s))  Urinalysis, Routine w reflex microscopic (not at [facility])     Status: Abnormal   Collection Time: 10/26/15 12:30 PM  Result Value Ref Range   Color, Urine YELLOW YELLOW   APPearance CLEAR CLEAR   Specific Gravity, Urine <1.005 (L) 1.005 - 1.030   pH 6.0 5.0 - 8.0   Glucose, UA NEGATIVE NEGATIVE mg/dL   Hgb urine dipstick NEGATIVE NEGATIVE   Bilirubin Urine NEGATIVE NEGATIVE   Ketones, ur NEGATIVE NEGATIVE  mg/dL   Protein, ur NEGATIVE NEGATIVE mg/dL   Nitrite NEGATIVE NEGATIVE   Leukocytes, UA NEGATIVE NEGATIVE    Comment: MICROSCOPIC NOT DONE ON URINES WITH NEGATIVE PROTEIN, BLOOD, LEUKOCYTES, NITRITE, OR GLUCOSE <1000 mg/dL.  Pregnancy, urine POC     Status: None   Collection Time: 10/26/15 12:56 PM  Result Value Ref Range   Preg Test, Ur NEGATIVE NEGATIVE    Comment:        THE SENSITIVITY OF THIS METHODOLOGY IS >24 mIU/mL   Wet prep, genital     Status: Abnormal   Collection Time: 10/26/15  1:39 PM  Result Value Ref Range   Yeast Wet Prep HPF POC NONE SEEN NONE SEEN   Trich, Wet Prep NONE SEEN NONE SEEN   Clue Cells Wet Prep HPF POC NONE SEEN NONE SEEN   WBC, Wet Prep HPF POC MANY (A) NONE SEEN    Comment: MANY BACTERIA SEEN   Sperm NONE SEEN     Review of Systems  Constitutional: Negative for chills and fever.  Gastrointestinal: Negative for abdominal pain, nausea and vomiting.   Physical Exam   Blood pressure 151/76, pulse 74, temperature 97 F (36.1 C), temperature source  Axillary, resp. rate 18, SpO2 100 %.  Physical Exam  Constitutional: She is oriented to person, place, and time. She appears well-developed and well-nourished. No distress.  HENT:  Head: Normocephalic.  Eyes: Pupils are equal, round, and reactive to light.  GI: Soft. She exhibits no distension. There is no tenderness. There is no rebound and no guarding.  Genitourinary:  Genitourinary Comments: Vagina - Small amount of white/yellow vaginal discharge, no odor  Cervix - No contact bleeding, no active bleeding  Bimanual exam: Cervix closed, no CMT  Uterus non tender, normal size Adnexa non tender, no masses bilaterally GC/Chlam, wet prep done Chaperone present for exam.   Musculoskeletal: Normal range of motion.  Neurological: She is alert and oriented to person, place, and time.  Skin: Skin is warm. She is not diaphoretic.  Psychiatric: Her behavior is normal.    MAU Course  Procedures  None  MDM Wet prep GC HIV Azithromycin 1 gram Rocephin 250 mg IM   Assessment and Plan   A:  1. Exposure to STD     P:  Discharge home in stable condition Condoms always Return to MAU for emergencies only    Lezlie Lye, NP 10/26/2015 2:40 PM

## 2015-10-27 LAB — HIV ANTIBODY (ROUTINE TESTING W REFLEX): HIV SCREEN 4TH GENERATION: NONREACTIVE

## 2015-10-27 LAB — GC/CHLAMYDIA PROBE AMP (~~LOC~~) NOT AT ARMC
Chlamydia: NEGATIVE
NEISSERIA GONORRHEA: NEGATIVE

## 2015-11-02 ENCOUNTER — Inpatient Hospital Stay (HOSPITAL_COMMUNITY)
Admission: AD | Admit: 2015-11-02 | Discharge: 2015-11-02 | Disposition: A | Discharge status: Home / Self Care | Payer: Medicare Other | Source: Ambulatory Visit | Attending: Family Medicine | Admitting: Family Medicine

## 2015-11-02 DIAGNOSIS — R195 Other fecal abnormalities: Secondary | ICD-10-CM | POA: Insufficient documentation

## 2015-11-02 DIAGNOSIS — R197 Diarrhea, unspecified: Secondary | ICD-10-CM | POA: Insufficient documentation

## 2015-11-02 NOTE — MAU Note (Signed)
Pt presents to MAU with complaints of having an allergy to antibiotics that she received here last week for STD. Reports nausea and loose stools. Denies any vomiting

## 2015-11-02 NOTE — Discharge Instructions (Signed)

## 2015-11-02 NOTE — MAU Provider Note (Signed)
S:  [name] is a 65 y.o. female here with loose stools. She was seen on 10/9 here in MAU after her partner told her he had an STD. She was also on antibiotics for a tooth infection. We treated her in MAU for possible STD exposure with azithromycin and rocephin.   She is having multiple episodes of diarrhea. She is having loose stool all day, and this has been going on for about a month. She first noticed the loose stools after eating hot dogs a month ago and then it worsened after taking antibiotics for a tooth infection.   She denies blood in her stool. The color is brown. The stool is soft and not watery. She denies fever   O:  GENERAL: Well-developed, well-nourished female in no acute distress.  LUNGS: Effort normal SKIN: Warm, dry and without erythema PSYCH: Normal mood and affect  Vitals:   11/02/15 1228  BP: 150/79  Pulse: 77  Resp: 18  Temp: 98 F (36.7 C)  SpO2: 100%    A:  1. Abnormal stools      P:  Discharge home in stable condition Imodium over the counter as directed on the bottle  Go to [facility] or [place] if symptoms worsen Return to MAU for GYN problems only BRAT diet  Increase fluid intake   Lezlie Lye, NP 11/02/2015 1:17 PM

## 2015-12-03 ENCOUNTER — Ambulatory Visit (INDEPENDENT_AMBULATORY_CARE_PROVIDER_SITE_OTHER): Payer: Medicare Other

## 2015-12-03 ENCOUNTER — Ambulatory Visit (INDEPENDENT_AMBULATORY_CARE_PROVIDER_SITE_OTHER): Payer: Medicare Other | Admitting: Sports Medicine

## 2015-12-03 ENCOUNTER — Encounter (INDEPENDENT_AMBULATORY_CARE_PROVIDER_SITE_OTHER): Payer: Self-pay | Admitting: Sports Medicine

## 2015-12-03 VITALS — BP 160/85 | HR 91 | Ht 62.0 in | Wt 184.0 lb

## 2015-12-03 DIAGNOSIS — M79601 Pain in right arm: Secondary | ICD-10-CM | POA: Diagnosis not present

## 2015-12-03 DIAGNOSIS — M7541 Impingement syndrome of right shoulder: Secondary | ICD-10-CM

## 2015-12-03 DIAGNOSIS — M4302 Spondylolysis, cervical region: Secondary | ICD-10-CM | POA: Diagnosis not present

## 2015-12-03 NOTE — Progress Notes (Signed)
April Mathis - 65 y.o. female MRN [id number]  Date of birth: March 26, 1950  Office Visit Note: Visit Date: 12/03/2015 PCP: Philis Fendt, MD Referred by: Nolene Ebbs, MD  Subjective: Chief Complaint  Patient presents with  . Right Shoulder - New Evaluation  . right arm    Right shoulder pain radiating into right arm.  States arm and shoulder are throbbing and has a knot on the back of her right shoulder.  Has had knot for about 2 years.     HPI: One week of worsening right posterior shoulder & lateral deltoid pain that has been present intermittently over the past several years. She is pain with any type of overhead motion. Decreased range of motion. Pain is keeping her awake at night on occasion. Scribe is a throbbing pain. Additionally she has noted a small mass on the posterior aspect of her shoulder. She is concerned this may be causing the above symptoms. ROS: Long-standing issues with neck pain & limited range of motion. No prior neck injuries. She denies any fevers chills or night sweats. No significant weight fluctuations recently. Otherwise per HPI.   Clinical History: No specialty comments available.  She reports that she has been smoking Cigarettes.  She has a 20.00 pack-year smoking history. She has never used smokeless tobacco.  No results for input(s): HGBA1C, LABURIC in the last 8760 hours.  Assessment & Plan: Visit Diagnoses:  1. Arm pain, diffuse, right   2. Impingement syndrome of right shoulder   3. Spondylolysis, cervical region    Plan: Subacromial injection provided today given positive impingement signs. If persistent ongoing symptoms consider further evaluation with  Further diagnostic testing of either the shoulder or cervical spine depending on presentation. Undoubtedly she has some radicular component given the findings on her x-rays however shoulder symptomatology was most focally problematic for her today.  Follow-up: Return if symptoms worsen or fail  to improve.  Meds: No orders of the defined types were placed in this encounter.  Large Joint Inj Date/Time: 12/03/2015 6:31 PM Performed by: Teresa Coombs D Authorized by: Teresa Coombs D   Consent Given by:  Patient Site marked: the procedure site was marked   Timeout: prior to procedure the correct patient, procedure, and site was verified   Indications:  Pain Location:  Shoulder Site:  R subacromial bursa Needle Size:  22 G Needle Length:  1.5 inches Approach:  Posterior Ultrasound Guidance: No   Fluoroscopic Guidance: No   Arthrogram: No   Medications:  3 mL lidocaine 1 %; 40 mg methylPREDNISolone acetate 40 MG/ML Aspiration Attempted: No   Patient tolerance:  Patient tolerated the procedure well with no immediate complications     Objective:  VS:  HT:5\' 2"  (157.5 cm)   WT:184 lb (83.5 kg)  BMI:33.7    BP:(!) 160/85  HR:91bpm  TEMP: ( )  RESP:  Physical Exam: Adult female. No acute distress. Alert & appropriately interactive. RIGHT ARM overall well aligned. She does have a prominence over the posterior aspect of the shoulder consistent with fibroma that is nontender to palpation with no focal fluctuance. No surrounding skin changes. She has pain with Luan Pulling as well as Neer's. Internal rotation, external rotation strength intact. Mild pain with arm squeeze test. Pain with Spurling's & Lhermitte's compression test is positive for radiation midthoracic spine. Upper extremity grip strength is intact. Imaging: Xr Cervical Spine 2 Or 3 Views  Result Date: 12/03/2015 Findings: 3V cervical spine: Slight reversal of cervical lordosis. Marked  osteophytic spurring at multiple levels most notably at C3-4 & C4-5. She has additional changes at adjacent segments as well. Impression: Severe cervical spondylosis with reversal of cervical lordosis.  Xr Shoulder Right  Result Date: 12/03/2015 Findings: 3 view x-ray of the right shoulder shows overall well aligned joint. She has  moderate degenerative change of the [facility] Joint. No humeral joint has some superior osteophytic spurring of the glenoid. Humeral head appears normal. No significant soft tissue findings. Impression: Moderate ac joint arthropathy with mild glenohumeral arthropathy.    Past Medical/Family/Surgical/Social History: Medications & Allergies reviewed per EMR Patient Active Problem List   Diagnosis Date Noted  . Spondylolysis, cervical region 12/03/2015  . Vaginitis 06/24/2013  . Stressful life event affecting family 02/20/2013  . DUB (dysfunctional uterine bleeding) 12/05/2011  . Varicose veins of lower extremities with other complications 0000000  . Preventative health care 08/11/2010  . PREDIABETES 08/17/2009  . DEGENERATIVE JOINT DISEASE, KNEE 12/28/2005  . TOBACCO ABUSE 12/01/2005  . HYPERTENSION 12/01/2005   Past Medical History:  Diagnosis Date  . Bilateral leg edema   . DVT of lower extremity (deep venous thrombosis) ([place])   . History of recurrent UTIs   . Hypertension   . Knee pain   . Left knee DJD 2002   MRI done in 2002  . Obesity   . Smoking   . Varicose vein    Family History  Problem Relation Age of Onset  . Cancer Mother     LUNG  . Anesthesia problems Neg Hx    Past Surgical History:  Procedure Laterality Date  . KNEE ARTHROSCOPY    . TUBAL LIGATION     Social History   Occupational History  . Not on file.   Social History Main Topics  . Smoking status: Current Every Day Smoker    Packs/day: 1.00    Years: 20.00    Types: Cigarettes  . Smokeless tobacco: Never Used     Comment: 1/2 PPD  . Alcohol use No  . Drug use: No  . Sexual activity: Yes    Birth control/ protection: None

## 2015-12-04 ENCOUNTER — Ambulatory Visit (INDEPENDENT_AMBULATORY_CARE_PROVIDER_SITE_OTHER): Payer: Medicare Other | Admitting: Orthopedic Surgery

## 2015-12-04 MED ORDER — LIDOCAINE HCL 1 % IJ SOLN
3.0000 mL | INTRAMUSCULAR | Status: AC | PRN
  Administered 2015-12-03: 3 mL

## 2015-12-04 MED ORDER — METHYLPREDNISOLONE ACETATE 40 MG/ML IJ SUSP
40.0000 mg | INTRAMUSCULAR | Status: AC | PRN
  Administered 2015-12-03: 40 mg via INTRA_ARTICULAR

## 2015-12-07 ENCOUNTER — Ambulatory Visit (INDEPENDENT_AMBULATORY_CARE_PROVIDER_SITE_OTHER): Payer: Medicare Other | Admitting: Orthopedic Surgery

## 2015-12-24 ENCOUNTER — Ambulatory Visit (INDEPENDENT_AMBULATORY_CARE_PROVIDER_SITE_OTHER): Payer: Medicare Other | Admitting: Orthopedic Surgery

## 2016-01-14 ENCOUNTER — Inpatient Hospital Stay (HOSPITAL_COMMUNITY)
Admission: AD | Admit: 2016-01-14 | Discharge: 2016-01-14 | Disposition: A | Discharge status: Home / Self Care | Payer: Medicare Other | Source: Ambulatory Visit | Attending: Obstetrics & Gynecology | Admitting: Obstetrics & Gynecology

## 2016-01-14 ENCOUNTER — Encounter (HOSPITAL_COMMUNITY): Payer: Self-pay | Admitting: *Deleted

## 2016-01-14 DIAGNOSIS — Z86718 Personal history of other venous thrombosis and embolism: Secondary | ICD-10-CM | POA: Diagnosis not present

## 2016-01-14 DIAGNOSIS — Z6834 Body mass index (BMI) 34.0-34.9, adult: Secondary | ICD-10-CM | POA: Insufficient documentation

## 2016-01-14 DIAGNOSIS — F17210 Nicotine dependence, cigarettes, uncomplicated: Secondary | ICD-10-CM | POA: Diagnosis not present

## 2016-01-14 DIAGNOSIS — E669 Obesity, unspecified: Secondary | ICD-10-CM | POA: Diagnosis not present

## 2016-01-14 DIAGNOSIS — I10 Essential (primary) hypertension: Secondary | ICD-10-CM | POA: Diagnosis not present

## 2016-01-14 DIAGNOSIS — Z888 Allergy status to other drugs, medicaments and biological substances status: Secondary | ICD-10-CM | POA: Insufficient documentation

## 2016-01-14 DIAGNOSIS — Z87440 Personal history of urinary (tract) infections: Secondary | ICD-10-CM | POA: Diagnosis not present

## 2016-01-14 DIAGNOSIS — B373 Candidiasis of vulva and vagina: Secondary | ICD-10-CM | POA: Insufficient documentation

## 2016-01-14 DIAGNOSIS — Z9851 Tubal ligation status: Secondary | ICD-10-CM | POA: Diagnosis not present

## 2016-01-14 DIAGNOSIS — R109 Unspecified abdominal pain: Secondary | ICD-10-CM | POA: Diagnosis present

## 2016-01-14 DIAGNOSIS — Z79899 Other long term (current) drug therapy: Secondary | ICD-10-CM | POA: Diagnosis not present

## 2016-01-14 DIAGNOSIS — N760 Acute vaginitis: Secondary | ICD-10-CM | POA: Diagnosis not present

## 2016-01-14 DIAGNOSIS — Z880 Allergy status to penicillin: Secondary | ICD-10-CM | POA: Insufficient documentation

## 2016-01-14 LAB — URINALYSIS, ROUTINE W REFLEX MICROSCOPIC
BILIRUBIN URINE: NEGATIVE
Glucose, UA: NEGATIVE mg/dL
HGB URINE DIPSTICK: NEGATIVE
Ketones, ur: NEGATIVE mg/dL
Leukocytes, UA: NEGATIVE
NITRITE: NEGATIVE
PROTEIN: 30 mg/dL — AB
SPECIFIC GRAVITY, URINE: 1.019 (ref 1.005–1.030)
pH: 6 (ref 5.0–8.0)

## 2016-01-14 LAB — POCT PREGNANCY, URINE: Preg Test, Ur: NEGATIVE

## 2016-01-14 LAB — WET PREP, GENITAL
Clue Cells Wet Prep HPF POC: NONE SEEN
Sperm: NONE SEEN
TRICH WET PREP: NONE SEEN

## 2016-01-14 MED ORDER — FLUCONAZOLE 150 MG PO TABS
150.0000 mg | ORAL_TABLET | Freq: Once | ORAL | 3 refills | Status: DC
Start: 2016-01-14 — End: 2016-01-14

## 2016-01-14 NOTE — MAU Provider Note (Signed)
History     CSN: [id number]  Arrival date and time: 01/14/16 1227   None     Chief Complaint  Patient presents with  . Abdominal Pain  . vaginal irritation   HPI 65 yo postmenopausal patient presenting for the evaluation of a 3-day history of a pruritic vaginal discharge with odor. Patient also reports some lower abdominal pain which started 3 days ago. She is sexually active without contraception with different partners. She is concerned about STD. Patient also reports some burning with urination. She denies any vaginal bleeding.   Past Medical History:  Diagnosis Date  . Bilateral leg edema   . DVT of lower extremity (deep venous thrombosis) ([place])   . History of recurrent UTIs   . Hypertension   . Knee pain   . Left knee DJD 2002   MRI done in 2002  . Obesity   . Smoking   . Varicose vein     Past Surgical History:  Procedure Laterality Date  . KNEE ARTHROSCOPY    . TUBAL LIGATION      Family History  Problem Relation Age of Onset  . Cancer Mother     LUNG  . Anesthesia problems Neg Hx     Social History  Substance Use Topics  . Smoking status: Current Every Day Smoker    Packs/day: 1.00    Years: 20.00    Types: Cigarettes  . Smokeless tobacco: Never Used     Comment: 1/2 PPD  . Alcohol use No    Allergies:  Allergies  Allergen Reactions  . Hctz [Hydrochlorothiazide] Other (See Comments)    Reaction:  Drops pts BP  . Penicillins Hives and Other (See Comments)    Has patient had a PCN reaction causing immediate rash, facial/tongue/throat swelling, SOB or lightheadedness with hypotension: No Has patient had a PCN reaction causing severe rash involving mucus membranes or skin necrosis: No Has patient had a PCN reaction that required hospitalization No Has patient had a PCN reaction occurring within the last 10 years: No If all of the above answers are "NO", then may proceed with Cephalosporin use.    Prescriptions Prior to Admission  Medication  Sig Dispense Refill Last Dose  . ibuprofen (ADVIL,MOTRIN) 800 MG tablet Take 1 tablet (800 mg total) by mouth 3 (three) times daily. 21 tablet 0 Taking  . lisinopril (PRINIVIL,ZESTRIL) 20 MG tablet Take 20 mg by mouth daily.   Taking  . Oxycodone HCl 10 MG TABS Take 10 mg by mouth 3 (three) times daily as needed (for pain).   0 Taking    ROS  See pertinent in HPI Physical Exam   Blood pressure 180/84, pulse 82, temperature 98.2 F (36.8 C), temperature source Oral, resp. rate 18, height 5\' 2"  (1.575 m), weight 186 lb (84.4 kg).  Physical Exam GENERAL: Well-developed, well-nourished female in no acute distress.  LUNGS: Clear to auscultation bilaterally.  HEART: Regular rate and rhythm. ABDOMEN: Soft, nontender, nondistended. No organomegaly. PELVIC: Normal external female genitalia. Vagina is pink and rugated.  Normal white discharge. Normal appearing cervix. Uterus is normal in size. No adnexal mass or tenderness. EXTREMITIES: No cyanosis, clubbing, or edema, 2+ distal pulses.  MAU Course  Procedures  MDM UA- negative Wet prep- positive yeast GC/Cl- pending  Assessment and Plan  65 yo with yeast vaginitis - Rx diflucan provided - patient noted to have high blood pressure. She admits to not taking her medications regularly. Encouraged her to take her medications as prescribed  and to follow up with PCP - patient will be notified with pelvic culture results if abnormal  Kylo Gavin 01/14/2016, 1:20 PM

## 2016-01-14 NOTE — Discharge Instructions (Signed)
Atrophic Vaginitis Introduction Atrophic vaginitis is when the tissues that line the vagina become dry and thin. This is caused by a drop in estrogen. Estrogen helps:  To keep the vagina moist.  To make a clear fluid that helps:  To lubricate the vagina for sex.  To protect the vagina from infection. If the lining of the vagina is dry and thin, it may:  Make sex painful. It may also cause bleeding.  Cause a feeling of:  Burning.  Irritation.  Itchiness.  Make an exam of your vagina painful. It may also cause bleeding.  Make you lose interest in sex.  Cause a burning feeling when you pee.  Make your vaginal fluid (discharge) brown or yellow. For some women, there are no symptoms. This condition is most common in women who do not get their regular menstrual periods anymore (menopause). This often starts when a woman is 37-44 years old. Follow these instructions at home:  Take medicines only as told by your doctor. Do not use any herbal or alternative medicines unless your doctor says it is okay.  Use over-the-counter products for dryness only as told by your doctor. These include:  Creams.  Lubricants.  Moisturizers.  Do not douche.  Do not use products that can make your vagina dry. These include:  Scented feminine sprays.  Scented tampons.  Scented soaps.  If it hurts to have sex, tell your sexual partner. Contact a doctor if:  Your discharge looks different than normal.  Your vagina has an unusual smell.  You have new symptoms.  Your symptoms do not get better with treatment.  Your symptoms get worse. This information is not intended to replace advice given to you by your health care provider. Make sure you discuss any questions you have with your health care provider. Document Released: 06/22/2007 Document Revised: 06/11/2015 Document Reviewed: 12/25/2013  2017 Elsevier

## 2016-01-14 NOTE — MAU Note (Signed)
Pt C/O lower abd pain x 3 days & vaginal itching & burning also for 3 days, is concerned she may have a STD.  Has white/yellow discharge, slight odor.

## 2016-01-15 LAB — GC/CHLAMYDIA PROBE AMP (~~LOC~~) NOT AT ARMC
CHLAMYDIA, DNA PROBE: NEGATIVE
Neisseria Gonorrhea: NEGATIVE

## 2016-02-10 ENCOUNTER — Emergency Department (HOSPITAL_COMMUNITY): Payer: Medicare Other

## 2016-02-10 ENCOUNTER — Encounter (HOSPITAL_COMMUNITY): Payer: Self-pay

## 2016-02-10 ENCOUNTER — Emergency Department (HOSPITAL_COMMUNITY)
Admission: EM | Admit: 2016-02-10 | Discharge: 2016-02-10 | Disposition: A | Discharge status: Home / Self Care | Payer: Medicare Other | Attending: Emergency Medicine | Admitting: Emergency Medicine

## 2016-02-10 DIAGNOSIS — F17210 Nicotine dependence, cigarettes, uncomplicated: Secondary | ICD-10-CM | POA: Insufficient documentation

## 2016-02-10 DIAGNOSIS — R072 Precordial pain: Secondary | ICD-10-CM | POA: Diagnosis present

## 2016-02-10 DIAGNOSIS — R0789 Other chest pain: Secondary | ICD-10-CM | POA: Diagnosis not present

## 2016-02-10 DIAGNOSIS — I10 Essential (primary) hypertension: Secondary | ICD-10-CM | POA: Diagnosis not present

## 2016-02-10 DIAGNOSIS — Z79899 Other long term (current) drug therapy: Secondary | ICD-10-CM | POA: Diagnosis not present

## 2016-02-10 LAB — BASIC METABOLIC PANEL
ANION GAP: 9 (ref 5–15)
BUN: 12 mg/dL (ref 6–20)
CALCIUM: 9.3 mg/dL (ref 8.9–10.3)
CO2: 24 mmol/L (ref 22–32)
CREATININE: 0.67 mg/dL (ref 0.44–1.00)
Chloride: 106 mmol/L (ref 101–111)
Glucose, Bld: 114 mg/dL — ABNORMAL HIGH (ref 65–99)
Potassium: 4.9 mmol/L (ref 3.5–5.1)
SODIUM: 139 mmol/L (ref 135–145)

## 2016-02-10 LAB — CBC
HCT: 39.7 % (ref 36.0–46.0)
HEMOGLOBIN: 12.9 g/dL (ref 12.0–15.0)
MCH: 28.7 pg (ref 26.0–34.0)
MCHC: 32.5 g/dL (ref 30.0–36.0)
MCV: 88.4 fL (ref 78.0–100.0)
Platelets: 191 10*3/uL (ref 150–400)
RBC: 4.49 MIL/uL (ref 3.87–5.11)
RDW: 14 % (ref 11.5–15.5)
WBC: 5 10*3/uL (ref 4.0–10.5)

## 2016-02-10 LAB — I-STAT TROPONIN, ED
TROPONIN I, POC: 0 ng/mL (ref 0.00–0.08)
Troponin i, poc: 0.02 ng/mL (ref 0.00–0.08)

## 2016-02-10 MED ORDER — LORAZEPAM 1 MG PO TABS
0.5000 mg | ORAL_TABLET | Freq: Once | ORAL | Status: AC
  Administered 2016-02-10: 0.5 mg via ORAL
  Filled 2016-02-10: qty 1

## 2016-02-10 MED ORDER — NITROGLYCERIN 0.4 MG SL SUBL
0.4000 mg | SUBLINGUAL_TABLET | SUBLINGUAL | Status: DC | PRN
  Administered 2016-02-10: 0.4 mg via SUBLINGUAL
  Filled 2016-02-10: qty 1

## 2016-02-10 MED ORDER — GI COCKTAIL ~~LOC~~: 30.0000 mL | Freq: Once | ORAL | Status: DC

## 2016-02-10 MED ORDER — LIDOCAINE VISCOUS 2 % MT SOLN
15.0000 mL | Freq: Once | OROMUCOSAL | Status: AC
  Administered 2016-02-10: 15 mL via OROMUCOSAL
  Filled 2016-02-10: qty 15

## 2016-02-10 MED ORDER — ALUM & MAG HYDROXIDE-SIMETH 200-200-20 MG/5ML PO SUSP
15.0000 mL | Freq: Once | ORAL | Status: AC
  Administered 2016-02-10: 15 mL via ORAL
  Filled 2016-02-10: qty 30

## 2016-02-10 NOTE — ED Notes (Signed)
Papers reviewed with patient and she verbalizes understanding. IV removed and patient reports no chest pain at this time
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
## 2016-02-10 NOTE — Discharge Instructions (Signed)
Try zantac 150mg twice a day.  ° ° °

## 2016-02-10 NOTE — ED Notes (Signed)
Patient verbalizes a safe place to go with her daughter

## 2016-02-15 ENCOUNTER — Encounter: Payer: Self-pay | Admitting: Obstetrics

## 2016-02-15 ENCOUNTER — Other Ambulatory Visit (HOSPITAL_COMMUNITY)
Admission: RE | Admit: 2016-02-15 | Discharge: 2016-02-15 | Disposition: A | Discharge status: Home / Self Care | Payer: Medicare Other | Source: Ambulatory Visit | Attending: Obstetrics | Admitting: Obstetrics

## 2016-02-15 ENCOUNTER — Ambulatory Visit (INDEPENDENT_AMBULATORY_CARE_PROVIDER_SITE_OTHER): Payer: Medicare Other | Admitting: Obstetrics

## 2016-02-15 VITALS — BP 161/83 | HR 71 | Temp 98.7°F | Ht 62.0 in | Wt 189.0 lb

## 2016-02-15 DIAGNOSIS — N949 Unspecified condition associated with female genital organs and menstrual cycle: Secondary | ICD-10-CM | POA: Diagnosis not present

## 2016-02-15 DIAGNOSIS — B3731 Acute candidiasis of vulva and vagina: Secondary | ICD-10-CM

## 2016-02-15 DIAGNOSIS — Z202 Contact with and (suspected) exposure to infections with a predominantly sexual mode of transmission: Secondary | ICD-10-CM

## 2016-02-15 DIAGNOSIS — B349 Viral infection, unspecified: Secondary | ICD-10-CM

## 2016-02-15 DIAGNOSIS — L298 Other pruritus: Secondary | ICD-10-CM

## 2016-02-15 DIAGNOSIS — B373 Candidiasis of vulva and vagina: Secondary | ICD-10-CM | POA: Diagnosis not present

## 2016-02-15 DIAGNOSIS — Z0000 Encounter for general adult medical examination without abnormal findings: Secondary | ICD-10-CM

## 2016-02-15 DIAGNOSIS — R102 Pelvic and perineal pain: Secondary | ICD-10-CM | POA: Diagnosis present

## 2016-02-15 DIAGNOSIS — N9489 Other specified conditions associated with female genital organs and menstrual cycle: Secondary | ICD-10-CM

## 2016-02-15 DIAGNOSIS — Z206 Contact with and (suspected) exposure to human immunodeficiency virus [HIV]: Secondary | ICD-10-CM

## 2016-02-15 DIAGNOSIS — A64 Unspecified sexually transmitted disease: Secondary | ICD-10-CM

## 2016-02-15 DIAGNOSIS — N898 Other specified noninflammatory disorders of vagina: Secondary | ICD-10-CM

## 2016-02-15 DIAGNOSIS — N760 Acute vaginitis: Secondary | ICD-10-CM

## 2016-02-15 LAB — POCT URINALYSIS DIPSTICK
Bilirubin, UA: NEGATIVE
Glucose, UA: NEGATIVE
KETONES UA: NEGATIVE
LEUKOCYTES UA: NEGATIVE
Nitrite, UA: NEGATIVE
PH UA: 6
SPEC GRAV UA: 1.01
UROBILINOGEN UA: 0.2

## 2016-02-15 MED ORDER — TERCONAZOLE 0.4 % VA CREA: 1.0000 | TOPICAL_CREAM | Freq: Every day | VAGINAL | 0 refills | Status: DC

## 2016-02-15 MED ORDER — CLOTRIMAZOLE 1 % EX CREA: 1.0000 "application " | TOPICAL_CREAM | Freq: Two times a day (BID) | CUTANEOUS | 1 refills | Status: DC

## 2016-02-15 NOTE — Progress Notes (Signed)
Patient ID: JANYRIA SENSAT, female   DOB: 11-19-1950, 66 y.o.   MRN: [id number]  Chief Complaint  Patient presents with  . Vaginal Itching    HPI KARSEN HARTONG is a 66 y.o. female.  Vaginal itching and irritation on the inside and outside. HPI  Past Medical History:  Diagnosis Date  . Bilateral leg edema   . DVT of lower extremity (deep venous thrombosis) ([place])   . History of recurrent UTIs   . Hypertension   . Knee pain   . Left knee DJD 2002   MRI done in 2002  . Obesity   . Smoking   . Varicose vein     Past Surgical History:  Procedure Laterality Date  . KNEE ARTHROSCOPY    . TUBAL LIGATION      Family History  Problem Relation Age of Onset  . Cancer Mother     LUNG  . Anesthesia problems Neg Hx     Social History Social History  Substance Use Topics  . Smoking status: Current Every Day Smoker    Packs/day: 1.00    Years: 20.00    Types: Cigarettes  . Smokeless tobacco: Never Used     Comment: 1/2 PPD  . Alcohol use Yes     Comment: "every now and then, not on a regular basis     Allergies  Allergen Reactions  . Hctz [Hydrochlorothiazide] Other (See Comments)    Reaction:  Drops pts BP  . Penicillins Hives and Other (See Comments)    Has patient had a PCN reaction causing immediate rash, facial/tongue/throat swelling, SOB or lightheadedness with hypotension: No Has patient had a PCN reaction causing severe rash involving mucus membranes or skin necrosis: No Has patient had a PCN reaction that required hospitalization No Has patient had a PCN reaction occurring within the last 10 years: No If all of the above answers are "NO", then may proceed with Cephalosporin use.    Current Outpatient Prescriptions  Medication Sig Dispense Refill  . cyclobenzaprine (FLEXERIL) 5 MG tablet Take 5 mg by mouth 3 (three) times daily as needed for pain.    Marland Kitchen ibuprofen (ADVIL,MOTRIN) 800 MG tablet Take 1 tablet (800 mg total) by mouth 3 (three) times daily. 21 tablet 0   . lisinopril (PRINIVIL,ZESTRIL) 20 MG tablet Take 20 mg by mouth daily.    . Oxycodone HCl 10 MG TABS Take 10 mg by mouth 3 (three) times daily as needed (for pain).   0  . clotrimazole (LOTRIMIN) 1 % cream Apply 1 application topically 2 (two) times daily. 60 g 1  . terconazole (TERAZOL 7) 0.4 % vaginal cream Place 1 applicator vaginally at bedtime. 45 g 0   No current facility-administered medications for this visit.     Review of Systems Review of Systems Constitutional: negative for fatigue and weight loss Respiratory: negative for cough and wheezing Cardiovascular: negative for chest pain, fatigue and palpitations Gastrointestinal: negative for abdominal pain and change in bowel habits Genitourinary:positive for vaginal itching Integument/breast: negative for nipple discharge Musculoskeletal:negative for myalgias Neurological: negative for gait problems and tremors Behavioral/Psych: negative for abusive relationship, depression Endocrine: negative for temperature intolerance      Blood pressure (!) 161/83, pulse 71, temperature 98.7 F (37.1 C), height 5\' 2"  (1.575 m), weight 189 lb (85.7 kg).  Physical Exam Physical Exam           General:  Alert and no distress Abdomen:  normal findings: no organomegaly, soft, non-tender and  no hernia  Pelvis:  External genitalia: normal general appearance Urinary system: urethral meatus normal and bladder without fullness, nontender Vaginal: normal without tenderness, induration or masses Cervix: normal appearance Adnexa: normal bimanual exam Uterus: anteverted and non-tender, normal size    50% of 15 min visit spent on counseling and coordination of care.    Data Reviewed Wet prep  Assessment     Vulvovaginitis    Plan    Clotrimazole cream Rx Terazol 7 Rx F/u prn  Orders Placed This Encounter  Procedures  . Hepatitis B surface antigen  . Hepatitis C antibody  . RPR  . POCT urinalysis dipstick   Meds ordered this  encounter  Medications  . terconazole (TERAZOL 7) 0.4 % vaginal cream    Sig: Place 1 applicator vaginally at bedtime.    Dispense:  45 g    Refill:  0  . clotrimazole (LOTRIMIN) 1 % cream    Sig: Apply 1 application topically 2 (two) times daily.    Dispense:  60 g    Refill:  1

## 2016-02-15 NOTE — Progress Notes (Signed)
Pt presents for Gyn problem c/o outer vulva itching, vaginal burning, and yellow discharge x 1 wk.

## 2016-02-16 LAB — CERVICOVAGINAL ANCILLARY ONLY
Bacterial vaginitis: NEGATIVE
CANDIDA VAGINITIS: NEGATIVE
CHLAMYDIA, DNA PROBE: NEGATIVE
Neisseria Gonorrhea: NEGATIVE
Trichomonas: NEGATIVE

## 2016-02-16 LAB — HEPATITIS B SURFACE ANTIGEN: Hepatitis B Surface Ag: NEGATIVE

## 2016-02-16 LAB — HEPATITIS C ANTIBODY: Hep C Virus Ab: 0.1 {s_co_ratio} (ref 0.0–0.9)

## 2016-02-16 LAB — RPR: RPR Ser Ql: NONREACTIVE

## 2016-03-04 ENCOUNTER — Ambulatory Visit (INDEPENDENT_AMBULATORY_CARE_PROVIDER_SITE_OTHER): Payer: Medicare Other | Admitting: Orthopedic Surgery

## 2016-03-04 ENCOUNTER — Telehealth (INDEPENDENT_AMBULATORY_CARE_PROVIDER_SITE_OTHER): Payer: Self-pay | Admitting: *Deleted

## 2016-03-04 DIAGNOSIS — M1712 Unilateral primary osteoarthritis, left knee: Secondary | ICD-10-CM | POA: Diagnosis not present

## 2016-03-04 MED ORDER — METHYLPREDNISOLONE ACETATE 40 MG/ML IJ SUSP
40.0000 mg | INTRAMUSCULAR | Status: AC | PRN
  Administered 2016-03-04: 40 mg via INTRA_ARTICULAR

## 2016-03-04 MED ORDER — LIDOCAINE HCL 1 % IJ SOLN
5.0000 mL | INTRAMUSCULAR | Status: AC | PRN
  Administered 2016-03-04: 5 mL

## 2016-03-04 NOTE — Progress Notes (Signed)
Office Visit Note   Patient: April Mathis           Date of Birth: 08/25/1950           MRN: [id number] Visit Date: 03/04/2016              Requested by: Nolene Ebbs, MD [address] [place], [place] [address] PCP: Philis Fendt, MD  No chief complaint on file.   HPI: The patient is a 66 year old woman who presents today in follow-up for left knee pain. Has had good relief from Depo-Medrol injections in the past. Is requesting repeat injection today. She complains of increased pain as well as locking and some giving way of the left knee.  Would not like to proceed with total knee replacement sometime.    Assessment & Plan: Visit Diagnoses:  1. Unilateral primary osteoarthritis, left knee     Plan: Injection today. May continue with Ibuprofen for pain as needed. Follow up as needed in the office.   Follow-Up Instructions: Return if symptoms worsen or fail to improve.   Physical Exam  Constitutional: Appears well-developed.  Head: Normocephalic.  Eyes: EOM are normal.  Neck: Normal range of motion.  Cardiovascular: Normal rate.   Pulmonary/Chest: Effort normal.  Neurological: Is alert.  Skin: Skin is warm.  Psychiatric: Has a normal mood and affect.  Left Knee Exam  Left knee exam is normal.  Tenderness  The patient is experiencing tenderness in the medial joint line.  Range of Motion  The patient has normal left knee ROM.  Tests  Varus: negative Valgus: negative  Other  Erythema: absent Swelling: none Effusion: no effusion present       Imaging: No results found.  Orders:  No orders of the defined types were placed in this encounter.  No orders of the defined types were placed in this encounter.    Procedures: Large Joint Inj Date/Time: 03/04/2016 10:40 AM Performed by: Suzan Slick Authorized by: Dondra Prader R   Consent Given by:  Patient Site marked: the procedure site was marked   Timeout: prior to procedure the correct  patient, procedure, and site was verified   Indications:  Pain and diagnostic evaluation Location:  Knee Site:  L knee Needle Size:  22 G Needle Length:  1.5 inches Ultrasound Guidance: No   Fluoroscopic Guidance: No   Arthrogram: No   Medications:  5 mL lidocaine 1 %; 40 mg methylPREDNISolone acetate 40 MG/ML Aspiration Attempted: No   Patient tolerance:  Patient tolerated the procedure well with no immediate complications    Clinical Data: No additional findings.  Subjective: Review of Systems  Constitutional: Negative for chills and fever.    Objective: Vital Signs: There were no vitals taken for this visit.  Specialty Comments:  No specialty comments available.  PMFS History: Patient Active Problem List   Diagnosis Date Noted  . Spondylolysis, cervical region 12/03/2015  . Vaginitis 06/24/2013  . Stressful life event affecting family 02/20/2013  . DUB (dysfunctional uterine bleeding) 12/05/2011  . Varicose veins of lower extremities with other complications 0000000  . Preventative health care 08/11/2010  . PREDIABETES 08/17/2009  . Unilateral primary osteoarthritis, left knee 12/28/2005  . TOBACCO ABUSE 12/01/2005  . HYPERTENSION 12/01/2005   Past Medical History:  Diagnosis Date  . Bilateral leg edema   . DVT of lower extremity (deep venous thrombosis) ([place])   . History of recurrent UTIs   . Hypertension   . Knee pain   .  Left knee DJD 2002   MRI done in 2002  . Obesity   . Smoking   . Varicose vein     Family History  Problem Relation Age of Onset  . Cancer Mother     LUNG  . Anesthesia problems Neg Hx     Past Surgical History:  Procedure Laterality Date  . KNEE ARTHROSCOPY    . TUBAL LIGATION     Social History   Occupational History  . Not on file.   Social History Main Topics  . Smoking status: Current Every Day Smoker    Packs/day: 1.00    Years: 20.00    Types: Cigarettes  . Smokeless tobacco: Never Used     Comment: 1/2 PPD   . Alcohol use Yes     Comment: "every now and then, not on a regular basis   . Drug use: No  . Sexual activity: Yes    Birth control/ protection: None

## 2016-03-04 NOTE — Telephone Encounter (Signed)
Patient was seen this morning by Dr. Sharol Given and she noticed that her feet are also bothering her as well. She would like to know what Dr. Sharol Given could suggest for her to for this pain? Her CB # [phone]. Thank you

## 2016-03-04 NOTE — Telephone Encounter (Signed)
Please make follow up appt. thanks

## 2016-03-08 ENCOUNTER — Telehealth (INDEPENDENT_AMBULATORY_CARE_PROVIDER_SITE_OTHER): Payer: Self-pay | Admitting: *Deleted

## 2016-03-08 NOTE — Telephone Encounter (Signed)
Pt called stating she is in a lot of pain from the injection that she previously had. Pt has an appt 2/22 already to see Dr. Sharol Given for her feet.

## 2016-03-08 NOTE — Telephone Encounter (Signed)
I called and spoke with patient, she is having a lot of pain in her knee. She denies fever or chills. She is coming for appt on 03/10/16 she does not wish to come in sooner. She would like to only see MD. I advised her that I would make a notation in her chart stating she only wishes to see Dr. Sharol Given.

## 2016-03-10 ENCOUNTER — Ambulatory Visit (INDEPENDENT_AMBULATORY_CARE_PROVIDER_SITE_OTHER): Payer: Medicare Other | Admitting: Orthopedic Surgery

## 2016-03-25 ENCOUNTER — Telehealth (INDEPENDENT_AMBULATORY_CARE_PROVIDER_SITE_OTHER): Payer: Self-pay | Admitting: Orthopedic Surgery

## 2016-03-25 ENCOUNTER — Ambulatory Visit (INDEPENDENT_AMBULATORY_CARE_PROVIDER_SITE_OTHER): Payer: Medicare Other | Admitting: Orthopedic Surgery

## 2016-03-25 NOTE — Telephone Encounter (Signed)
Pt had an appt today and she cancelled her appt. I tried to call and no answer. I will hold message and try again Monday.

## 2016-03-25 NOTE — Telephone Encounter (Signed)
Patient wanted to talk with you. She has a few questions about her leg. Cb # [phone]

## 2016-03-29 NOTE — Telephone Encounter (Signed)
I called pt and she states that she wants to speak with Dr. Sharol Given directly. I advised that he is in surgery. The pt did not want me to relay any questions for her and that she would call back and make an appt.

## 2016-04-27 ENCOUNTER — Ambulatory Visit (INDEPENDENT_AMBULATORY_CARE_PROVIDER_SITE_OTHER): Payer: Medicare Other | Admitting: Orthopedic Surgery

## 2016-04-27 ENCOUNTER — Ambulatory Visit (INDEPENDENT_AMBULATORY_CARE_PROVIDER_SITE_OTHER): Payer: Medicare Other | Admitting: Family

## 2016-04-27 DIAGNOSIS — M1712 Unilateral primary osteoarthritis, left knee: Secondary | ICD-10-CM

## 2016-04-28 ENCOUNTER — Ambulatory Visit (INDEPENDENT_AMBULATORY_CARE_PROVIDER_SITE_OTHER): Payer: Medicare Other

## 2016-04-28 ENCOUNTER — Ambulatory Visit (INDEPENDENT_AMBULATORY_CARE_PROVIDER_SITE_OTHER): Payer: Medicare Other | Admitting: Orthopedic Surgery

## 2016-04-28 VITALS — Ht 62.0 in | Wt 189.0 lb

## 2016-04-28 DIAGNOSIS — G8929 Other chronic pain: Secondary | ICD-10-CM

## 2016-04-28 DIAGNOSIS — M25562 Pain in left knee: Secondary | ICD-10-CM

## 2016-04-28 MED ORDER — METHYLPREDNISOLONE ACETATE 40 MG/ML IJ SUSP
40.0000 mg | INTRAMUSCULAR | Status: AC | PRN
  Administered 2016-04-28: 40 mg via INTRA_ARTICULAR

## 2016-04-28 MED ORDER — LIDOCAINE HCL 1 % IJ SOLN
5.0000 mL | INTRAMUSCULAR | Status: AC | PRN
  Administered 2016-04-28: 5 mL

## 2016-04-28 NOTE — Progress Notes (Signed)
Office Visit Note   Patient: April Mathis           Date of Birth: 10-29-1950           MRN: [id number] Visit Date: 04/28/2016              Requested by: Nolene Ebbs, MD [address] [place], [place] [address] PCP: Philis Fendt, MD  Chief Complaint  Patient presents with  . Left Knee - Pain      HPI: Patient is a 70 sexual woman presents she is status post an injection of her left knee. She states that her last injection was not performed in the proper location and that she has been having increasing pain. Patient states that her pain has gotten worse since her last injection. Patient complains of pain with weightbearing.  Assessment & Plan: Visit Diagnoses:  1. Chronic pain of left knee     Plan: Patient's left knee was injected from the anterior medial portal. Discussed that we can proceed with 3-4 shots a year and a surgical option would be a total knee replacement discussed the risks and benefits and patient states that she is not ready to proceed with a knee replacement at this time.  Follow-Up Instructions: No Follow-up on file.   Ortho Exam  Patient is alert, oriented, no adenopathy, well-dressed, normal affect, normal respiratory effort. Examination patient does have an antalgic gait there is no effusion. There is crepitation range of motion of her left knee" presents are stable she is mostly tender to palpation of the medial joint line.  Imaging: No results found.  Labs: Lab Results  Component Value Date   HGBA1C 5.5 06/24/2013   HGBA1C 5.4 02/20/2013   HGBA1C 5.5 10/09/2012   REPTSTATUS 06/02/2011 FINAL 05/31/2011   CULT No Herpes Simplex Virus detected. 05/31/2011   LABORGA ESCHERICHIA COLI 01/18/2010    Orders:  Orders Placed This Encounter  Procedures  . XR Knee 1-2 Views Left   No orders of the defined types were placed in this encounter.    Procedures: Large Joint Inj Date/Time: 04/28/2016 9:50 AM Performed by: Jennings Corado  V Authorized by: Newt Minion   Consent Given by:  Patient Site marked: the procedure site was marked   Timeout: prior to procedure the correct patient, procedure, and site was verified   Indications:  Pain and diagnostic evaluation Location:  Knee Site:  L knee Prep: patient was prepped and draped in usual sterile fashion   Needle Size:  22 G Needle Length:  1.5 inches Approach:  Anteromedial Ultrasound Guidance: No   Fluoroscopic Guidance: No   Arthrogram: No   Medications:  5 mL lidocaine 1 %; 40 mg methylPREDNISolone acetate 40 MG/ML Aspiration Attempted: No   Patient tolerance:  Patient tolerated the procedure well with no immediate complications    Clinical Data: No additional findings.  ROS:  All other systems negative, except as noted in the HPI. Review of Systems  Objective: Vital Signs: Ht 5\' 2"  (1.575 m)   Wt 189 lb (85.7 kg)   BMI 34.57 kg/m   Specialty Comments:  No specialty comments available.  PMFS History: Patient Active Problem List   Diagnosis Date Noted  . Spondylolysis, cervical region 12/03/2015  . Vaginitis 06/24/2013  . Stressful life event affecting family 02/20/2013  . DUB (dysfunctional uterine bleeding) 12/05/2011  . Varicose veins of lower extremities with other complications 81/44/8185  . Preventative health care 08/11/2010  . PREDIABETES 08/17/2009  .  Unilateral primary osteoarthritis, left knee 12/28/2005  . TOBACCO ABUSE 12/01/2005  . HYPERTENSION 12/01/2005   Past Medical History:  Diagnosis Date  . Bilateral leg edema   . DVT of lower extremity (deep venous thrombosis) ([place])   . History of recurrent UTIs   . Hypertension   . Knee pain   . Left knee DJD 2002   MRI done in 2002  . Obesity   . Smoking   . Varicose vein     Family History  Problem Relation Age of Onset  . Cancer Mother     LUNG  . Anesthesia problems Neg Hx     Past Surgical History:  Procedure Laterality Date  . KNEE ARTHROSCOPY    . TUBAL  LIGATION     Social History   Occupational History  . Not on file.   Social History Main Topics  . Smoking status: Current Every Day Smoker    Packs/day: 1.00    Years: 20.00    Types: Cigarettes  . Smokeless tobacco: Never Used     Comment: 1/2 PPD  . Alcohol use Yes     Comment: "every now and then, not on a regular basis   . Drug use: No  . Sexual activity: Yes    Birth control/ protection: None

## 2016-05-04 NOTE — Progress Notes (Signed)
Left without being seen.
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
## 2016-05-26 ENCOUNTER — Ambulatory Visit (INDEPENDENT_AMBULATORY_CARE_PROVIDER_SITE_OTHER): Payer: Medicare Other

## 2016-05-26 ENCOUNTER — Encounter (INDEPENDENT_AMBULATORY_CARE_PROVIDER_SITE_OTHER): Payer: Self-pay | Admitting: Orthopedic Surgery

## 2016-05-26 ENCOUNTER — Ambulatory Visit (INDEPENDENT_AMBULATORY_CARE_PROVIDER_SITE_OTHER): Payer: Medicare Other | Admitting: Orthopedic Surgery

## 2016-05-26 VITALS — Ht 62.0 in | Wt 189.0 lb

## 2016-05-26 DIAGNOSIS — M542 Cervicalgia: Secondary | ICD-10-CM

## 2016-05-26 DIAGNOSIS — D1721 Benign lipomatous neoplasm of skin and subcutaneous tissue of right arm: Secondary | ICD-10-CM

## 2016-05-26 MED ORDER — PREDNISONE 10 MG PO TABS: 10.0000 mg | ORAL_TABLET | Freq: Every day | ORAL | 0 refills | Status: DC

## 2016-05-26 NOTE — Progress Notes (Signed)
Office Visit Note   Patient: April Mathis           Date of Birth: [birth year]           MRN: [id number] Visit Date: 05/26/2016              Requested by: Nolene Ebbs, MD [address] [place], [place] [address] PCP: Nolene Ebbs, MD  Chief Complaint  Patient presents with  . Neck - Pain      HPI: Patient is a 66 year old woman who presents with pain radiating from her cervical spine to be a subtotal region radiating down the posterior aspect of the shoulder down to the lateral aspect of her triceps. Patient states that she has crepitation with range of motion of her neck she has no pain with range of motion of her shoulder. She states that the right sided neck pain has been worse over the past 4-5 days. Patient also complains of a lipoma in the posterior aspect of her right shoulder. Patient states that the injection for her left knee has helped significantly.  Assessment & Plan: Visit Diagnoses:  1. Cervical spine pain   2. Lipoma of right upper extremity     Plan: Prescription called in for prednisone 10 mg every morning with breakfast to try to relieve the cervical spine radicular symptoms. Advised against any type of treatment for the lipoma. Follow-up in 3 weeks to reevaluate discussed that she may require an MRI scan of her cervical spine.  Follow-Up Instructions: Return in about 3 weeks (around 06/16/2016).   Ortho Exam  Patient is alert, oriented, no adenopathy, well-dressed, normal affect, normal respiratory effort. Examination patient has decreased range of motion of her cervical spine with flexion extension rotation and bending. She has tenderness to palpation of thoracic outlet. Neer and Hawkins impingement tests are negative for the shoulder. She has no focal motor weakness in either upper extremity. Patient has a soft lipoma in the posterior aspect of the right shoulder which is about 3 cm in diameter.  Imaging: Xr Cervical Spine 2 Or 3 Views  Result Date:  05/26/2016 Two-view radiographs of the cervical spine show advanced degenerative disc disease with large osteophytic bone spurs. The largest bone spurs aren't C3-4,C4-5, C5-6 and C6-7.   Labs: Lab Results  Component Value Date   HGBA1C 5.5 06/24/2013   HGBA1C 5.4 02/20/2013   HGBA1C 5.5 10/09/2012   REPTSTATUS 06/02/2011 FINAL 05/31/2011   CULT No Herpes Simplex Virus detected. 05/31/2011   LABORGA ESCHERICHIA COLI 01/18/2010    Orders:  Orders Placed This Encounter  Procedures  . XR Cervical Spine 2 or 3 views   Meds ordered this encounter  Medications  . predniSONE (DELTASONE) 10 MG tablet    Sig: Take 1 tablet (10 mg total) by mouth daily with breakfast.    Dispense:  30 tablet    Refill:  0     Procedures: No procedures performed  Clinical Data: No additional findings.  ROS:  All other systems negative, except as noted in the HPI. Review of Systems  Objective: Vital Signs: Ht 5\' 2"  (1.575 m)   Wt 189 lb (85.7 kg)   BMI 34.57 kg/m   Specialty Comments:  No specialty comments available.  PMFS History: Patient Active Problem List   Diagnosis Date Noted  . Cervical spine pain 05/26/2016  . Lipoma of right upper extremity 05/26/2016  . Spondylolysis, cervical region 12/03/2015  . Vaginitis 06/24/2013  . Stressful life event affecting family 02/20/2013  .  DUB (dysfunctional uterine bleeding) 12/05/2011  . Varicose veins of lower extremities with other complications 46/28/6381  . Preventative health care 08/11/2010  . PREDIABETES 08/17/2009  . Unilateral primary osteoarthritis, left knee 12/28/2005  . TOBACCO ABUSE 12/01/2005  . HYPERTENSION 12/01/2005   Past Medical History:  Diagnosis Date  . Bilateral leg edema   . DVT of lower extremity (deep venous thrombosis) ([place])   . History of recurrent UTIs   . Hypertension   . Knee pain   . Left knee DJD 2002   MRI done in 2002  . Obesity   . Smoking   . Varicose vein     Family History  Problem  Relation Age of Onset  . Cancer Mother        LUNG  . Anesthesia problems Neg Hx     Past Surgical History:  Procedure Laterality Date  . KNEE ARTHROSCOPY    . TUBAL LIGATION     Social History   Occupational History  . Not on file.   Social History Main Topics  . Smoking status: Current Every Day Smoker    Packs/day: 1.00    Years: 20.00    Types: Cigarettes  . Smokeless tobacco: Never Used     Comment: 1/2 PPD  . Alcohol use Yes     Comment: "every now and then, not on a regular basis   . Drug use: No  . Sexual activity: Yes    Birth control/ protection: None

## 2016-05-29 ENCOUNTER — Emergency Department (HOSPITAL_COMMUNITY)
Admission: EM | Admit: 2016-05-29 | Discharge: 2016-05-29 | Disposition: A | Discharge status: Home / Self Care | Payer: Medicare Other | Attending: Emergency Medicine | Admitting: Emergency Medicine

## 2016-05-29 ENCOUNTER — Encounter (HOSPITAL_COMMUNITY): Payer: Self-pay | Admitting: Emergency Medicine

## 2016-05-29 DIAGNOSIS — R51 Headache: Secondary | ICD-10-CM | POA: Insufficient documentation

## 2016-05-29 DIAGNOSIS — Z7982 Long term (current) use of aspirin: Secondary | ICD-10-CM | POA: Insufficient documentation

## 2016-05-29 DIAGNOSIS — I10 Essential (primary) hypertension: Secondary | ICD-10-CM | POA: Diagnosis not present

## 2016-05-29 DIAGNOSIS — M5412 Radiculopathy, cervical region: Secondary | ICD-10-CM | POA: Diagnosis not present

## 2016-05-29 DIAGNOSIS — Z79899 Other long term (current) drug therapy: Secondary | ICD-10-CM | POA: Diagnosis not present

## 2016-05-29 DIAGNOSIS — F17210 Nicotine dependence, cigarettes, uncomplicated: Secondary | ICD-10-CM | POA: Diagnosis not present

## 2016-05-29 DIAGNOSIS — M542 Cervicalgia: Secondary | ICD-10-CM | POA: Diagnosis present

## 2016-05-29 DIAGNOSIS — R519 Headache, unspecified: Secondary | ICD-10-CM

## 2016-05-29 LAB — CBG MONITORING, ED: Glucose-Capillary: 84 mg/dL (ref 65–99)

## 2016-05-29 MED ORDER — SODIUM CHLORIDE 0.9 % IV BOLUS (SEPSIS)
1000.0000 mL | Freq: Once | INTRAVENOUS | Status: AC
  Administered 2016-05-29: 1000 mL via INTRAVENOUS

## 2016-05-29 MED ORDER — ONDANSETRON 4 MG PO TBDP: 4.0000 mg | ORAL_TABLET | Freq: Once | ORAL | Status: DC

## 2016-05-29 MED ORDER — KETOROLAC TROMETHAMINE 30 MG/ML IJ SOLN
30.0000 mg | Freq: Once | INTRAMUSCULAR | Status: AC
  Administered 2016-05-29: 30 mg via INTRAVENOUS
  Filled 2016-05-29: qty 1

## 2016-05-29 MED ORDER — ONDANSETRON 4 MG PO TBDP
ORAL_TABLET | ORAL | Status: AC
  Filled 2016-05-29: qty 1

## 2016-05-29 NOTE — ED Notes (Signed)
Patient with sudden nausea.  Patient medicated per standing protocol.

## 2016-05-29 NOTE — ED Provider Notes (Signed)
[facility] DEPT Provider Note   CSN: [id number] Arrival date & time: 05/29/16  1884     History   Chief Complaint Chief Complaint  Patient presents with  . Headache    HPI April Mathis is a 66 y.o. female.  HPI   Patient initially came to ED this morning for headache that woke her from sleep.  The headache has since resolved.  The headache was over the top of her head, across her forehead, similar to a headache she had earlier this week.  Associated sensitivity to light, blurry vision.  Pounding pain.  She thinks the headache came from spending several hours outside in the heat yesterday (mid-90s temp).  Since the headache resolved she Is focused more on the pain in the right side of her neck, radiating into her shoulder.  This is due to a pinched nerve, has seen Dr Sharol Given (orthopedics) for this and is currently on prednisone.  She is concerned that the prednisone is increasing her blood pressure and this may also be causing her headaches.  Has appt with PCP [facility] tomorrow. Denies fevers, chills, lightheadedness/dizziness, weakness or numbness of the extremities.    Past Medical History:  Diagnosis Date  . Bilateral leg edema   . DVT of lower extremity (deep venous thrombosis) ([place])   . History of recurrent UTIs   . Hypertension   . Knee pain   . Left knee DJD 2002   MRI done in 2002  . Obesity   . Smoking   . Varicose vein     Patient Active Problem List   Diagnosis Date Noted  . Cervical spine pain 05/26/2016  . Lipoma of right upper extremity 05/26/2016  . Spondylolysis, cervical region 12/03/2015  . Vaginitis 06/24/2013  . Stressful life event affecting family 02/20/2013  . DUB (dysfunctional uterine bleeding) 12/05/2011  . Varicose veins of lower extremities with other complications 16/60/6301  . Preventative health care 08/11/2010  . PREDIABETES 08/17/2009  . Unilateral primary osteoarthritis, left knee 12/28/2005  . TOBACCO ABUSE 12/01/2005  .  HYPERTENSION 12/01/2005    Past Surgical History:  Procedure Laterality Date  . KNEE ARTHROSCOPY    . TUBAL LIGATION      OB History    Gravida Para Term Preterm AB Living   6 4 4  0 2 4   SAB TAB Ectopic Multiple Live Births   1 1 0 0 4       Home Medications    Prior to Admission medications   Medication Sig Start Date End Date Taking? Authorizing Provider  aspirin EC 81 MG tablet Take 81 mg by mouth daily.    Yes [provider]  clotrimazole (LOTRIMIN) 1 % cream Apply 1 application topically 2 (two) times daily. Patient taking differently: Apply 1 application topically 2 (two) times daily as needed (rash).  02/15/16  Yes Shelly Bombard, MD  cyclobenzaprine (FLEXERIL) 5 MG tablet Take 5 mg by mouth 3 (three) times daily as needed for pain. 01/16/16  Yes [provider]  ibuprofen (ADVIL,MOTRIN) 800 MG tablet Take 1 tablet (800 mg total) by mouth 3 (three) times daily. Patient taking differently: Take 800 mg by mouth every 8 (eight) hours as needed for moderate pain.  10/19/15  Yes Shary Decamp, PA-C  lisinopril (PRINIVIL,ZESTRIL) 20 MG tablet Take 20 mg by mouth daily.   Yes [provider]  Oxycodone HCl 10 MG TABS Take 10 mg by mouth 3 (three) times daily as needed (for pain).  Yes [provider]  predniSONE (DELTASONE) 10 MG tablet Take 1 tablet (10 mg total) by mouth daily with breakfast. 05/26/16  Yes Newt Minion, MD    Family History Family History  Problem Relation Age of Onset  . Cancer Mother        LUNG  . Anesthesia problems Neg Hx     Social History Social History  Substance Use Topics  . Smoking status: Current Every Day Smoker    Packs/day: 1.00    Years: 20.00    Types: Cigarettes  . Smokeless tobacco: Never Used     Comment: 1/2 PPD  . Alcohol use Yes     Comment: "every now and then, not on a regular basis      Allergies   Hctz [hydrochlorothiazide] and Penicillins   Review of Systems Review of  Systems  All other systems reviewed and are negative.    Physical Exam Updated Vital Signs BP (!) 162/63 (BP Location: Left Arm) Comment: Simultaneous filing. User may not have seen previous data.  Pulse 61 Comment: Simultaneous filing. User may not have seen previous data.  Temp 98.8 F (37.1 C) (Oral)   Resp 20   Ht 5\' 2"  (1.575 m)   Wt 83.5 kg   SpO2 97% Comment: Simultaneous filing. User may not have seen previous data.  BMI 33.65 kg/m   Physical Exam  Constitutional: She appears well-developed and well-nourished. No distress.  HENT:  Head: Normocephalic and atraumatic.  Eyes: Conjunctivae are normal.  Neck: Normal range of motion. Neck supple.  Cardiovascular: Normal rate and regular rhythm.   Pulmonary/Chest: Effort normal.  Musculoskeletal:       Back:  Spine nontender, no crepitus, or stepoffs.  Upper extremities:  Strength 5/5, sensation intact, distal pulses intact.     Neurological: She is alert. She exhibits normal muscle tone.  No gross neurologic deficits. Upper extremities:  Strength 5/5, sensation intact, distal pulses intact.     Skin: She is not diaphoretic.  Nursing note and vitals reviewed.    ED Treatments / Results  Labs (all labs ordered are listed, but only abnormal results are displayed) Labs Reviewed  CBG MONITORING, ED    EKG  EKG Interpretation None       Radiology No results found.  Procedures Procedures (including critical care time)  Medications Ordered in ED Medications  ondansetron (ZOFRAN-ODT) disintegrating tablet 4 mg (4 mg Oral Incomplete 05/29/16 0435)  sodium chloride 0.9 % bolus 1,000 mL (0 mLs Intravenous Stopped 05/29/16 0918)  ketorolac (TORADOL) 30 MG/ML injection 30 mg (30 mg Intravenous Given 05/29/16 0708)     Initial Impression / Assessment and Plan / ED Course  I have reviewed the triage vital signs and the nursing notes.  Pertinent labs & imaging results that were available during my care of the  patient were reviewed by me and considered in my medical decision making (see chart for details).  Clinical Course as of May 29 1033  Sun May 29, 2016  0857 Pt reports complete relief of pain.    [EW]    Clinical Course User Index [EW] [place], Eugen [place], [place]    Afebrile, nontoxic patient with headache this morning that resolved prior to my seeing her.  C/O cervical radiculopathy that has been ongoing, with treatment by Dr Sharol Given.  No change.  No new injury.  No red flags for headache.  Pt feeling much better after medications.   D/C home with PCP, ortho follow up.  Discussed  result, findings, treatment, and follow up  with patient.  Pt given return precautions.  Pt verbalizes understanding and agrees with plan.       Final Clinical Impressions(s) / ED Diagnoses   Final diagnoses:  Acute nonintractable headache, unspecified headache type  Cervical radiculopathy    New Prescriptions Discharge Medication List as of 05/29/2016  8:59 AM       Clayton Bibles, PA-C 05/29/16 [place], Gwenyth Allegra, MD 05/30/16 [phone]

## 2016-05-29 NOTE — Discharge Instructions (Signed)
Read the information below.  You may return to the Emergency Department at any time for worsening condition or any new symptoms that concern you.   If you develop fevers, loss of control of bowel or bladder, weakness or numbness in your arms or legs, or are unable to walk, return to the ER for a recheck.   You are having a headache. No specific cause was found today for your headache. It may have been a migraine or other cause of headache. Stress, anxiety, fatigue, and depression are common triggers for headaches. Your headache today does not appear to be life-threatening or require hospitalization, but often the exact cause of headaches is not determined in the emergency department. Therefore, follow-up with your doctor is very important to find out what may have caused your headache, and whether or not you need any further diagnostic testing or treatment. Sometimes headaches can appear benign (not harmful), but then more serious symptoms can develop which should prompt an immediate re-evaluation by your doctor or the emergency department. SEEK MEDICAL ATTENTION IF: You develop possible problems with medications prescribed.  The medications don't resolve your headache, if it recurs , or if you have multiple episodes of vomiting or can't take fluids. You have a change from the usual headache. RETURN IMMEDIATELY IF you develop a sudden, severe headache or confusion, become poorly responsive or faint, develop a fever above 100.18F or problem breathing, have a change in speech, vision, swallowing, or understanding, or develop new weakness, numbness, tingling, incoordination, or have a seizure.

## 2016-05-29 NOTE — ED Notes (Signed)
No answer in lobby x 3 

## 2016-05-29 NOTE — ED Triage Notes (Signed)
Patient with headache and photophobia.  Patient states that she has trouble with her BP, then has a headache.  She has been on prednisone for her chronic shoulder pain and just started them tonight.  No nausea or vomiting at this time.

## 2016-05-29 NOTE — ED Notes (Signed)
Pt c/o headache since 3am this morning. No dizziness, no nausea vomiting. Neuro intact. Pt in NAD. Pain 8/10

## 2016-06-07 ENCOUNTER — Telehealth (INDEPENDENT_AMBULATORY_CARE_PROVIDER_SITE_OTHER): Payer: Self-pay | Admitting: Orthopedic Surgery

## 2016-06-07 NOTE — Telephone Encounter (Signed)
PR REQUESTED A CALL BACK REGARDING A MEDICINE SHE WAS PRESCRIBED, STATED SHE HAD COMPLICATIONS. ALSO WANTED TO ASK ABOUT THE STATUS OF SOME KIND OF PAPERWORK FOR A "PCA."   [phone]

## 2016-06-16 ENCOUNTER — Ambulatory Visit (INDEPENDENT_AMBULATORY_CARE_PROVIDER_SITE_OTHER): Payer: Medicare Other | Admitting: Orthopedic Surgery

## 2016-06-16 ENCOUNTER — Telehealth (INDEPENDENT_AMBULATORY_CARE_PROVIDER_SITE_OTHER): Payer: Self-pay | Admitting: Orthopedic Surgery

## 2016-06-16 ENCOUNTER — Encounter (INDEPENDENT_AMBULATORY_CARE_PROVIDER_SITE_OTHER): Payer: Self-pay | Admitting: Orthopedic Surgery

## 2016-06-16 VITALS — Ht 62.0 in | Wt 184.0 lb

## 2016-06-16 DIAGNOSIS — M542 Cervicalgia: Secondary | ICD-10-CM | POA: Diagnosis not present

## 2016-06-16 DIAGNOSIS — D1721 Benign lipomatous neoplasm of skin and subcutaneous tissue of right arm: Secondary | ICD-10-CM

## 2016-06-16 DIAGNOSIS — M1712 Unilateral primary osteoarthritis, left knee: Secondary | ICD-10-CM

## 2016-06-16 NOTE — Telephone Encounter (Signed)
Please advise 

## 2016-06-16 NOTE — Progress Notes (Signed)
Office Visit Note   Patient: April Mathis           Date of Birth: [birth year]           MRN: [id number] Visit Date: 06/16/2016              Requested by: Nolene Ebbs, MD [address] [place], [place] [address] PCP: Nolene Ebbs, MD  Chief Complaint  Patient presents with  . Left Knee - Pain  . Right Shoulder - Pain      HPI: Patient is a 66 year old woman presents in follow-up she states that the lipoma right shoulder has become painful. She states that her knee pain has resolved after 6 weeks from her last injection she states is asymptomatic at this time. Patient states the prednisone relieved [place] neck pain. Patient states that she was taking the 10 mg of prednisone she went to the emergency room and her blood pressure was 220. She has just seen her primary care physician at [facility] and her lisinopril was increased to 40 mg a day. I recommended she not take her muscle relaxer. Recommended that she hold on the prednisone.  Assessment & Plan: Visit Diagnoses:  1. Cervical spine pain   2. Unilateral primary osteoarthritis, left knee   3. Lipoma of right upper extremity     Plan: Patient states that she will follow up with a general surgeon for excision of the lipoma. I discussed that since this is been there for 3 years this is most likely a benign lipoma and that excision can be sent for pathology review. Discussed that she would not need an MRI scan would not need a biopsy. Recommended exercises for her knees with follow-up as needed for a steroid injection. Do not feel she needs a knee replacement at this time her knee is asymptomatic.  Follow-Up Instructions: Return if symptoms worsen or fail to improve.   Ortho Exam  Patient is alert, oriented, no adenopathy, well-dressed, normal affect, normal respiratory effort. Patient has a normal gait. Examination she has a soft mobile lipoma on the posterior aspect of her shoulder which is approximately 5 cm in  diameter. There is no skin color changes no skin lesions. The lipoma is soft and nontender to palpation. Examination the left knee she has no effusion no pain with active or passive range of motion) cruciate are stable. Current blood pressure patient states his 80. Discussed the importance of blood pressure control.  Imaging: No results found.  Labs: Lab Results  Component Value Date   HGBA1C 5.5 06/24/2013   HGBA1C 5.4 02/20/2013   HGBA1C 5.5 10/09/2012   REPTSTATUS 06/02/2011 FINAL 05/31/2011   CULT No Herpes Simplex Virus detected. 05/31/2011   LABORGA ESCHERICHIA COLI 01/18/2010    Orders:  No orders of the defined types were placed in this encounter.  No orders of the defined types were placed in this encounter.    Procedures: No procedures performed  Clinical Data: No additional findings.  ROS:  All other systems negative, except as noted in the HPI. Review of Systems  Objective: Vital Signs: Ht 5\' 2"  (1.575 m)   Wt 184 lb (83.5 kg)   BMI 33.65 kg/m   Specialty Comments:  No specialty comments available.  PMFS History: Patient Active Problem List   Diagnosis Date Noted  . Cervical spine pain 05/26/2016  . Lipoma of right upper extremity 05/26/2016  . Spondylolysis, cervical region 12/03/2015  . Vaginitis 06/24/2013  . Stressful life event  affecting family 02/20/2013  . DUB (dysfunctional uterine bleeding) 12/05/2011  . Varicose veins of lower extremities with other complications 94/49/6759  . Preventative health care 08/11/2010  . PREDIABETES 08/17/2009  . Unilateral primary osteoarthritis, left knee 12/28/2005  . TOBACCO ABUSE 12/01/2005  . HYPERTENSION 12/01/2005   Past Medical History:  Diagnosis Date  . Bilateral leg edema   . DVT of lower extremity (deep venous thrombosis) ([facility])   . History of recurrent UTIs   . Hypertension   . Knee pain   . Left knee DJD 2002   MRI done in 2002  . Obesity   . Smoking   . Varicose vein     Family  History  Problem Relation Age of Onset  . Cancer Mother        LUNG  . Anesthesia problems Neg Hx     Past Surgical History:  Procedure Laterality Date  . KNEE ARTHROSCOPY    . TUBAL LIGATION     Social History   Occupational History  . Not on file.   Social History Main Topics  . Smoking status: Current Every Day Smoker    Packs/day: 1.00    Years: 20.00    Types: Cigarettes  . Smokeless tobacco: Never Used     Comment: 1/2 PPD  . Alcohol use Yes     Comment: "every now and then, not on a regular basis   . Drug use: No  . Sexual activity: Yes    Birth control/ protection: None

## 2016-06-16 NOTE — Telephone Encounter (Signed)
Patient called advised paperwork was faxed over for her to have a personal care assistant for her home from [facility]. Patient asked if the paperwork was completed and faxed back. The number to contact patient is [phone]

## 2016-06-20 NOTE — Telephone Encounter (Signed)
I called and advised pt that we did get the paperwork and that in order to complete the forms medical dx was needed to attest to her inability to dress herself, feed herself, bath herself etc. The neck and arm pain are not qualifying diagnoses for this. The pt began yelling and stating that she needs someone to do her house work for her because she is in pain and that she is right handed and that she has not mopped her floors for 3 weeks and that someone needs to come and do this for her. I advised that pt that a personal care attendant is not for house keeping alone and that the neck pain and shoulder pain alone does not qualify her for this. Pt states that every time she wants something from our office we " put up a road block" and that she wants the services. I called [facility] and they advised that in order for insurance to cover she has to meet 3 of the 5 requirements assistance with eating, toileting , bathing, dressing and mobility in the home. If three of these 5 things are met then the will assist with meal preparation and cleaning of the bathroom. ( not house cleaning) the pt walks independently without an assistive device there is no DME, brace wear or anything that would make mobility challenging. I will hold this message for Dr. Sharol Given to address with pt as no meaningful conversation can be met because of the pt's yelling.

## 2016-06-21 NOTE — Telephone Encounter (Signed)
Dr. Sharol Given called and lm on vm to advise of the requirements to meet the need for a personal care giver. And that if she felt that she met this criteria of not being able to feed herself, bath herself, dress herself or walk in her home then we can address the issues at an office visit to call with questions.

## 2016-06-29 ENCOUNTER — Encounter (INDEPENDENT_AMBULATORY_CARE_PROVIDER_SITE_OTHER): Payer: Self-pay | Admitting: Orthopedic Surgery

## 2016-06-29 ENCOUNTER — Ambulatory Visit (INDEPENDENT_AMBULATORY_CARE_PROVIDER_SITE_OTHER): Payer: Medicare Other | Admitting: Orthopedic Surgery

## 2016-06-29 DIAGNOSIS — D1721 Benign lipomatous neoplasm of skin and subcutaneous tissue of right arm: Secondary | ICD-10-CM | POA: Diagnosis not present

## 2016-06-29 DIAGNOSIS — M542 Cervicalgia: Secondary | ICD-10-CM

## 2016-06-29 DIAGNOSIS — G8929 Other chronic pain: Secondary | ICD-10-CM

## 2016-06-29 DIAGNOSIS — M25562 Pain in left knee: Secondary | ICD-10-CM | POA: Diagnosis not present

## 2016-06-29 NOTE — Progress Notes (Signed)
Office Visit Note   Patient: April Mathis           Date of Birth: 1950-06-28           MRN: [id number] Visit Date: 06/29/2016              Requested by: Nolene Ebbs, MD [address] [place], [place] [address] PCP: Nolene Ebbs, MD  Chief Complaint  Patient presents with  . Neck - Pain, Follow-up  . Left Knee - Pain, Follow-up      HPI: Patient is a 66 year old woman presents complaining of persistent left knee pain. She states that the injection she received from [facility] in February caused increased pain she states she then had several months of left knee pain she states that after my recent injection her knee has been better and she states she is asymptomatic at this time. She states she has had episodes of limping and has had episodes of her knee feel like it's going to give way. She states she still has a back pain with pain radiating to the anterior aspect of her right arm. She has had a subacromial injection which showed no relief. She has had an MRI scan which shows no significant disc pathology. Patient does have a lipoma in the posterior aspect of her right shoulder and she states that this is planned to be removed later on this month by Dr. Towanda Malkin. Patient states that she wants personal assistance from Medicaid and I reviewed the form with her. Discussed that since she is able to walk able to bathe herself able to feed herself and able to go to to the toilet on her own without assistance that she wouldn't not qualify for the state medical assistance. Patient does not use any assistive devices for ambulation.  Assessment & Plan: Visit Diagnoses:  1. Lipoma of right upper extremity   2. Chronic pain of left knee   3. Cervical spine pain     Plan: Recommended strength training for the upper and lower extremities. Discussed that heat on her neck would be helpful as well as heat on the knee. Patient states that with wearing her types that this compression and he is making  her knee feels a lot better. Discussed that we could set her up with formalized physical therapy and patient states that she would like to continue with her walking around the mall. She will follow-up as needed if she has recurrent knee pain discussed that we could proceed with an additional injection for her left knee. I discussed that if she ever has episodes where she is unable to perform her normal activities of daily living we could fill out the paperwork for state aid. Patient states that she will follow up with the [place] system to try to obtain VA disability assistance.  Follow-Up Instructions: Return if symptoms worsen or fail to improve.   Ortho Exam  Patient is alert, oriented, no adenopathy, well-dressed, normal affect, normal respiratory effort. Examination patient has a normal gait. She does not use assistive devices. She has no focal motor weakness in either upper or lower extremity. She does have a soft benign mobile lipoma in the posterior aspect of the right shoulder. She has no thoracic outlet tenderness she has good range of motion of her cervical spine she has full range of motion of both arms.  Imaging: No results found.  Labs: Lab Results  Component Value Date   HGBA1C 5.5 06/24/2013   HGBA1C 5.4 02/20/2013  HGBA1C 5.5 10/09/2012   REPTSTATUS 06/02/2011 FINAL 05/31/2011   CULT No Herpes Simplex Virus detected. 05/31/2011   LABORGA ESCHERICHIA COLI 01/18/2010    Orders:  No orders of the defined types were placed in this encounter.  No orders of the defined types were placed in this encounter.    Procedures: No procedures performed  Clinical Data: No additional findings.  ROS:  All other systems negative, except as noted in the HPI. Review of Systems  Objective: Vital Signs: There were no vitals taken for this visit.  Specialty Comments:  No specialty comments available.  PMFS History: Patient Active Problem List   Diagnosis Date Noted  . Chronic  pain of left knee 06/29/2016  . Cervical spine pain 05/26/2016  . Lipoma of right upper extremity 05/26/2016  . Spondylolysis, cervical region 12/03/2015  . Vaginitis 06/24/2013  . Stressful life event affecting family 02/20/2013  . DUB (dysfunctional uterine bleeding) 12/05/2011  . Varicose veins of lower extremities with other complications 59/56/3875  . Preventative health care 08/11/2010  . PREDIABETES 08/17/2009  . Unilateral primary osteoarthritis, left knee 12/28/2005  . TOBACCO ABUSE 12/01/2005  . HYPERTENSION 12/01/2005   Past Medical History:  Diagnosis Date  . Bilateral leg edema   . DVT of lower extremity (deep venous thrombosis) ([facility])   . History of recurrent UTIs   . Hypertension   . Knee pain   . Left knee DJD 2002   MRI done in 2002  . Obesity   . Smoking   . Varicose vein     Family History  Problem Relation Age of Onset  . Cancer Mother        LUNG  . Anesthesia problems Neg Hx     Past Surgical History:  Procedure Laterality Date  . KNEE ARTHROSCOPY    . TUBAL LIGATION     Social History   Occupational History  . Not on file.   Social History Main Topics  . Smoking status: Current Every Day Smoker    Packs/day: 1.00    Years: 20.00    Types: Cigarettes  . Smokeless tobacco: Never Used     Comment: 1/2 PPD  . Alcohol use Yes     Comment: "every now and then, not on a regular basis   . Drug use: No  . Sexual activity: Yes    Birth control/ protection: None

## 2016-08-30 ENCOUNTER — Telehealth (INDEPENDENT_AMBULATORY_CARE_PROVIDER_SITE_OTHER): Payer: Self-pay | Admitting: Orthopedic Surgery

## 2016-08-30 NOTE — Telephone Encounter (Signed)
Mailed medical records request to patient today  per patient request

## 2016-10-03 ENCOUNTER — Ambulatory Visit: Admit: 2016-10-03 | Payer: Medicare Other | Admitting: Orthopedic Surgery

## 2016-10-03 SURGERY — EXCISION MASS
Anesthesia: Monitor Anesthesia Care

## 2016-10-24 ENCOUNTER — Ambulatory Visit (INDEPENDENT_AMBULATORY_CARE_PROVIDER_SITE_OTHER): Payer: Medicare Other | Admitting: Obstetrics

## 2016-10-24 ENCOUNTER — Other Ambulatory Visit (HOSPITAL_COMMUNITY)
Admission: RE | Admit: 2016-10-24 | Discharge: 2016-10-24 | Disposition: A | Discharge status: Home / Self Care | Payer: Medicare Other | Source: Ambulatory Visit | Attending: Obstetrics | Admitting: Obstetrics

## 2016-10-24 ENCOUNTER — Encounter: Payer: Self-pay | Admitting: Obstetrics

## 2016-10-24 VITALS — BP 183/79 | HR 73 | Temp 97.7°F | Ht 62.0 in | Wt 189.3 lb

## 2016-10-24 DIAGNOSIS — Z01419 Encounter for gynecological examination (general) (routine) without abnormal findings: Secondary | ICD-10-CM | POA: Diagnosis present

## 2016-10-24 DIAGNOSIS — D179 Benign lipomatous neoplasm, unspecified: Secondary | ICD-10-CM

## 2016-10-24 DIAGNOSIS — N949 Unspecified condition associated with female genital organs and menstrual cycle: Secondary | ICD-10-CM | POA: Insufficient documentation

## 2016-10-24 DIAGNOSIS — R87610 Atypical squamous cells of undetermined significance on cytologic smear of cervix (ASC-US): Secondary | ICD-10-CM | POA: Diagnosis not present

## 2016-10-24 DIAGNOSIS — N9489 Other specified conditions associated with female genital organs and menstrual cycle: Secondary | ICD-10-CM

## 2016-10-24 DIAGNOSIS — B3731 Acute candidiasis of vulva and vagina: Secondary | ICD-10-CM

## 2016-10-24 DIAGNOSIS — B373 Candidiasis of vulva and vagina: Secondary | ICD-10-CM

## 2016-10-24 DIAGNOSIS — Z0000 Encounter for general adult medical examination without abnormal findings: Secondary | ICD-10-CM | POA: Diagnosis not present

## 2016-10-24 DIAGNOSIS — N898 Other specified noninflammatory disorders of vagina: Secondary | ICD-10-CM | POA: Diagnosis present

## 2016-10-24 DIAGNOSIS — Z113 Encounter for screening for infections with a predominantly sexual mode of transmission: Secondary | ICD-10-CM

## 2016-10-24 MED ORDER — CLOTRIMAZOLE 1 % EX CREA: 1.0000 "application " | TOPICAL_CREAM | Freq: Two times a day (BID) | CUTANEOUS | 2 refills | Status: DC

## 2016-10-24 NOTE — Progress Notes (Signed)
Subjective:        April Mathis is a 66 y.o. female here for a routine exam.  Current complaints: Vaginal discharge and vulva itching.  Fatty lump on right shoulder for ~ 2 years, non tender and has not changed significantly in size or character.    Personal health questionnaire:  Is patient Ashkenazi Jewish, have a family history of breast and/or ovarian cancer: no Is there a family history of uterine cancer diagnosed at age < 22, gastrointestinal cancer, urinary tract cancer, family member who is a Field seismologist syndrome-associated carrier: no Is the patient overweight and hypertensive, family history of diabetes, personal history of gestational diabetes, preeclampsia or PCOS: no Is patient over 34, have PCOS,  family history of premature CHD under age 83, diabetes, smoke, have hypertension or peripheral artery disease:  no At any time, has a partner hit, kicked or otherwise hurt or frightened you?: no Over the past 2 weeks, have you felt down, depressed or hopeless?: no Over the past 2 weeks, have you felt little interest or pleasure in doing things?:no   Gynecologic History No LMP recorded. Patient is postmenopausal. Contraception: post menopausal status Last Pap: 2017. Results were: normal Last mammogram: 2017. Results were: normal  Obstetric History OB History  Gravida Para Term Preterm AB Living  6 4 4  0 2 4  SAB TAB Ectopic Multiple Live Births  1 1 0 0 4    # Outcome Date GA Lbr Len/2nd Weight Sex Delivery Anes PTL Lv  6 SAB           5 Term           4 Term           3 Term           2 Term           1 TAB               Past Medical History:  Diagnosis Date  . Bilateral leg edema   . DVT of lower extremity (deep venous thrombosis) ([place])   . History of recurrent UTIs   . Hypertension   . Knee pain   . Left knee DJD 2002   MRI done in 2002  . Obesity   . Smoking   . Varicose vein     Past Surgical History:  Procedure Laterality Date  . KNEE ARTHROSCOPY     . TUBAL LIGATION       Current Outpatient Prescriptions:  .  aspirin EC 81 MG tablet, Take 81 mg by mouth daily. , Disp: , Rfl:  .  clotrimazole (LOTRIMIN) 1 % cream, Apply 1 application topically 2 (two) times daily., Disp: 60 g, Rfl: 2 .  cyclobenzaprine (FLEXERIL) 5 MG tablet, Take 5 mg by mouth 3 (three) times daily as needed for pain., Disp: , Rfl:  .  ibuprofen (ADVIL,MOTRIN) 800 MG tablet, Take 1 tablet (800 mg total) by mouth 3 (three) times daily. (Patient taking differently: Take 800 mg by mouth every 8 (eight) hours as needed for moderate pain. ), Disp: 21 tablet, Rfl: 0 .  lisinopril (PRINIVIL,ZESTRIL) 20 MG tablet, Take 20 mg by mouth daily., Disp: , Rfl:  .  Oxycodone HCl 10 MG TABS, Take 10 mg by mouth 3 (three) times daily as needed (for pain). , Disp: , Rfl: 0 .  predniSONE (DELTASONE) 10 MG tablet, Take 1 tablet (10 mg total) by mouth daily with breakfast., Disp: 30 tablet, Rfl: 0  Allergies  Allergen Reactions  . Hctz [Hydrochlorothiazide] Other (See Comments)    Reaction:  Drops pts BP  . Penicillins Hives and Other (See Comments)    Has patient had a PCN reaction causing immediate rash, facial/tongue/throat swelling, SOB or lightheadedness with hypotension: No Has patient had a PCN reaction causing severe rash involving mucus membranes or skin necrosis: No Has patient had a PCN reaction that required hospitalization No Has patient had a PCN reaction occurring within the last 10 years: No If all of the above answers are "NO", then may proceed with Cephalosporin use.    Social History  Substance Use Topics  . Smoking status: Current Every Day Smoker    Packs/day: 1.00    Years: 20.00    Types: Cigarettes  . Smokeless tobacco: Never Used     Comment: 1/2 PPD  . Alcohol use Yes     Comment: "every now and then, not on a regular basis     Family History  Problem Relation Age of Onset  . Cancer Mother        LUNG  . Anesthesia problems Neg Hx       Review of  Systems  Constitutional: negative for fatigue and weight loss Respiratory: negative for cough and wheezing Cardiovascular: negative for chest pain, fatigue and palpitations Gastrointestinal: negative for abdominal pain and change in bowel habits Musculoskeletal:negative for myalgias Neurological: negative for gait problems and tremors Behavioral/Psych: negative for abusive relationship, depression Endocrine: negative for temperature intolerance    Genitourinary:negative for abnormal menstrual periods, genital lesions, hot flashes, sexual problems and vaginal discharge Integument/breast: negative for breast lump, breast tenderness, nipple discharge and skin lesion(s)    Objective:       BP (!) 183/79   Pulse 73   Temp 97.7 F (36.5 C)   Ht 5\' 2"  (1.575 m)   Wt 189 lb 4.8 oz (85.9 kg)   BMI 34.62 kg/m  General:   alert  Skin:   no rash or abnormalities  Lungs:   clear to auscultation bilaterally  Heart:   regular rate and rhythm, S1, S2 normal, no murmur, click, rub or gallop  Breasts:   normal without suspicious masses, skin or nipple changes or axillary nodes  Abdomen:  normal findings: no organomegaly, soft, non-tender and no hernia  Pelvis:  External genitalia: normal general appearance Urinary system: urethral meatus normal and bladder without fullness, nontender Vaginal: normal without tenderness, induration or masses Cervix: normal appearance Adnexa: normal bimanual exam Uterus: anteverted and non-tender, normal size   Lab Review Urine pregnancy test Labs reviewed yes Radiologic studies reviewed yes  50% of 20 min visit spent on counseling and coordination of care.    Assessment:     1. Encounter for routine gynecological examination with Papanicolaou smear of cervix Rx: - Cytology - PAP  2. Vaginal itching Rx: - Cervicovaginal ancillary only  3. Vaginal burning Rx: - Cervicovaginal ancillary only  4. Vaginal discharge Rx: - Cervicovaginal ancillary  only - Cervicovaginal ancillary only  5. Candidal vulvovaginitis Rx: - clotrimazole (LOTRIMIN) 1 % cream; Apply 1 application topically 2 (two) times daily.  Dispense: 60 g; Refill: 2  6. Screening for STD (sexually transmitted disease) Rx: - HIV antibody - Hepatitis B surface antigen - RPR - Hepatitis C antibody  7. Benign lipomatous neoplasm Rx: - MR SHOULDER RIGHT W WO CONTRAST; Future   Plan:    Education reviewed: calcium supplements, depression evaluation, low fat, low cholesterol diet, safe sex/STD prevention, self  breast exams and weight bearing exercise. Follow up in: 1 year.   Meds ordered this encounter  Medications  . clotrimazole (LOTRIMIN) 1 % cream    Sig: Apply 1 application topically 2 (two) times daily.    Dispense:  60 g    Refill:  2   Orders Placed This Encounter  Procedures  . MR SHOULDER RIGHT W WO CONTRAST    Standing Status:   Future    Standing Expiration Date:   12/24/2017    Order Specific Question:   If indicated for the ordered procedure, I authorize the administration of contrast media per Radiology protocol    Answer:   Yes    Order Specific Question:   What is the patient's sedation requirement?    Answer:   No Sedation    Order Specific Question:   Does the patient have a pacemaker or implanted devices?    Answer:   No    Order Specific Question:   Radiology Contrast Protocol - do NOT remove file path    Answer:   \\[email or web address]    Order Specific Question:   Preferred imaging location?    Answer:   [facility] (table limit-500 lbs)  . HIV antibody  . Hepatitis B surface antigen  . RPR  . Hepatitis C antibody

## 2016-10-24 NOTE — Progress Notes (Signed)
Presents for AEX, wants PAP and STD/HIV. She is having itching, burning and discharge with slight odor.  Cyst on right shoulder. Complains of constipation.  PHQ-9=8

## 2016-10-25 ENCOUNTER — Other Ambulatory Visit: Payer: Self-pay | Admitting: Obstetrics

## 2016-10-25 LAB — CERVICOVAGINAL ANCILLARY ONLY
BACTERIAL VAGINITIS: POSITIVE — AB
CANDIDA VAGINITIS: NEGATIVE
CHLAMYDIA, DNA PROBE: NEGATIVE
Neisseria Gonorrhea: NEGATIVE
TRICH (WINDOWPATH): NEGATIVE

## 2016-10-25 LAB — HEPATITIS B SURFACE ANTIGEN: Hepatitis B Surface Ag: NEGATIVE

## 2016-10-25 LAB — RPR: RPR Ser Ql: NONREACTIVE

## 2016-10-25 LAB — HIV ANTIBODY (ROUTINE TESTING W REFLEX): HIV Screen 4th Generation wRfx: NONREACTIVE

## 2016-10-25 LAB — HEPATITIS C ANTIBODY

## 2016-10-26 ENCOUNTER — Other Ambulatory Visit: Payer: Self-pay | Admitting: Obstetrics

## 2016-10-26 DIAGNOSIS — N760 Acute vaginitis: Principal | ICD-10-CM

## 2016-10-26 DIAGNOSIS — B9689 Other specified bacterial agents as the cause of diseases classified elsewhere: Secondary | ICD-10-CM

## 2016-10-26 LAB — CYTOLOGY - PAP
Diagnosis: UNDETERMINED — AB
HPV (WINDOPATH): NOT DETECTED

## 2016-10-26 MED ORDER — SECNIDAZOLE 2 G PO PACK: 1.0000 | PACK | Freq: Once | ORAL | 2 refills | Status: AC

## 2016-10-27 ENCOUNTER — Other Ambulatory Visit: Payer: Self-pay

## 2016-10-27 DIAGNOSIS — N760 Acute vaginitis: Principal | ICD-10-CM

## 2016-10-27 DIAGNOSIS — B9689 Other specified bacterial agents as the cause of diseases classified elsewhere: Secondary | ICD-10-CM

## 2016-10-27 MED ORDER — METRONIDAZOLE 500 MG PO TABS: 500.0000 mg | ORAL_TABLET | Freq: Two times a day (BID) | ORAL | 0 refills | Status: AC

## 2016-10-27 NOTE — Progress Notes (Unsigned)
Rx changed to metronidazole per Dr. Jodi Mourning

## 2016-10-31 ENCOUNTER — Ambulatory Visit (HOSPITAL_COMMUNITY)
Admission: RE | Admit: 2016-10-31 | Discharge: 2016-10-31 | Disposition: A | Discharge status: Home / Self Care | Payer: Medicare Other | Source: Ambulatory Visit | Attending: Obstetrics | Admitting: Obstetrics

## 2016-10-31 ENCOUNTER — Encounter (HOSPITAL_COMMUNITY): Payer: Self-pay

## 2016-10-31 DIAGNOSIS — D179 Benign lipomatous neoplasm, unspecified: Secondary | ICD-10-CM | POA: Diagnosis present

## 2016-10-31 LAB — POCT I-STAT CREATININE: Creatinine, Ser: 0.6 mg/dL (ref 0.44–1.00)

## 2016-11-07 ENCOUNTER — Encounter (HOSPITAL_COMMUNITY): Payer: Self-pay

## 2016-11-07 ENCOUNTER — Ambulatory Visit (HOSPITAL_COMMUNITY): Admission: RE | Admit: 2016-11-07 | Payer: Medicare Other | Source: Ambulatory Visit

## 2016-11-07 ENCOUNTER — Ambulatory Visit: Payer: Medicare Other | Admitting: Obstetrics

## 2016-11-11 ENCOUNTER — Other Ambulatory Visit (HOSPITAL_COMMUNITY)
Admission: RE | Admit: 2016-11-11 | Discharge: 2016-11-11 | Disposition: A | Discharge status: Home / Self Care | Payer: Medicare Other | Source: Ambulatory Visit | Attending: Obstetrics | Admitting: Obstetrics

## 2016-11-11 ENCOUNTER — Encounter: Payer: Self-pay | Admitting: Obstetrics

## 2016-11-11 ENCOUNTER — Ambulatory Visit (INDEPENDENT_AMBULATORY_CARE_PROVIDER_SITE_OTHER): Payer: Medicare Other | Admitting: Obstetrics

## 2016-11-11 VITALS — Wt 194.8 lb

## 2016-11-11 DIAGNOSIS — R2231 Localized swelling, mass and lump, right upper limb: Secondary | ICD-10-CM

## 2016-11-11 DIAGNOSIS — Z113 Encounter for screening for infections with a predominantly sexual mode of transmission: Secondary | ICD-10-CM | POA: Diagnosis not present

## 2016-11-11 DIAGNOSIS — N898 Other specified noninflammatory disorders of vagina: Secondary | ICD-10-CM | POA: Insufficient documentation

## 2016-11-11 DIAGNOSIS — B9689 Other specified bacterial agents as the cause of diseases classified elsewhere: Secondary | ICD-10-CM | POA: Diagnosis not present

## 2016-11-11 DIAGNOSIS — B373 Candidiasis of vulva and vagina: Secondary | ICD-10-CM | POA: Diagnosis not present

## 2016-11-11 NOTE — Progress Notes (Signed)
Patient ID: April Mathis, female   DOB: Oct 19, 1950, 66 y.o.   MRN: [id number]  Chief Complaint  Patient presents with  . Follow-up    TOC-BV, Pt requesting another type of imaging aside of MRI, claims claustrophobia    HPI April Mathis is a 66 y.o. female.  Partner infidelity.  Has malodorous vaginal discharge and irritation  HPI  Past Medical History:  Diagnosis Date  . Bilateral leg edema   . DVT of lower extremity (deep venous thrombosis) ([place])   . History of recurrent UTIs   . Hypertension   . Knee pain   . Left knee DJD 2002   MRI done in 2002  . Obesity   . Smoking   . Varicose vein     Past Surgical History:  Procedure Laterality Date  . KNEE ARTHROSCOPY    . TUBAL LIGATION      Family History  Problem Relation Age of Onset  . Cancer Mother        LUNG  . Anesthesia problems Neg Hx     Social History Social History  Substance Use Topics  . Smoking status: Current Every Day Smoker    Packs/day: 1.00    Years: 20.00    Types: Cigarettes  . Smokeless tobacco: Never Used     Comment: 1/2 PPD  . Alcohol use Yes     Comment: "every now and then, not on a regular basis     Allergies  Allergen Reactions  . Hctz [Hydrochlorothiazide] Other (See Comments)    Reaction:  Drops pts BP  . Penicillins Hives and Other (See Comments)    Has patient had a PCN reaction causing immediate rash, facial/tongue/throat swelling, SOB or lightheadedness with hypotension: No Has patient had a PCN reaction causing severe rash involving mucus membranes or skin necrosis: No Has patient had a PCN reaction that required hospitalization No Has patient had a PCN reaction occurring within the last 10 years: No If all of the above answers are "NO", then may proceed with Cephalosporin use.    Current Outpatient Prescriptions  Medication Sig Dispense Refill  . aspirin EC 81 MG tablet Take 81 mg by mouth daily.     . clotrimazole (LOTRIMIN) 1 % cream Apply 1 application topically 2  (two) times daily. 60 g 2  . cyclobenzaprine (FLEXERIL) 5 MG tablet Take 5 mg by mouth 3 (three) times daily as needed for pain.    Marland Kitchen ibuprofen (ADVIL,MOTRIN) 800 MG tablet Take 1 tablet (800 mg total) by mouth 3 (three) times daily. (Patient taking differently: Take 800 mg by mouth every 8 (eight) hours as needed for moderate pain. ) 21 tablet 0  . lisinopril (PRINIVIL,ZESTRIL) 20 MG tablet Take 20 mg by mouth daily.    . Oxycodone HCl 10 MG TABS Take 10 mg by mouth 3 (three) times daily as needed (for pain).   0   No current facility-administered medications for this visit.     Review of Systems Review of Systems Constitutional: negative for fatigue and weight loss Respiratory: negative for cough and wheezing Cardiovascular: negative for chest pain, fatigue and palpitations Gastrointestinal: negative for abdominal pain and change in bowel habits Genitourinary:positive for malodorous vaginal discharge and irritation Integument/breast: positive for fatty lump on right shoulder, non tender Musculoskeletal:negative for myalgias Neurological: negative for gait problems and tremors Behavioral/Psych: negative for abusive relationship, depression Endocrine: negative for temperature intolerance      Weight 194 lb 12.8 oz (88.4 kg).  Physical  Exam Physical Exam           General:  Alert and no distress Abdomen:  normal findings: no organomegaly, soft, non-tender and no hernia  Pelvis:  External genitalia: normal general appearance Urinary system: urethral meatus normal and bladder without fullness, nontender Vaginal: normal without tenderness, induration or masses Cervix: normal appearance Adnexa: normal bimanual exam Uterus: anteverted and non-tender, normal size    50% of 15 min visit spent on counseling and coordination of care.    Data Reviewed Labs  Assessment     1. Vaginal discharge Rx - Cervicovaginal ancillary only  2. Screening for STD (sexually transmitted  disease)  3. Mass of skin of right shoulder Rx: - MR SHOULDER RIGHT W WO CONTRAST; Future    Plan    Follow up in 2 weeks  Orders Placed This Encounter  Procedures  . MR SHOULDER RIGHT W WO CONTRAST    Standing Status:   Future    Standing Expiration Date:   01/11/2018    Order Specific Question:   If indicated for the ordered procedure, I authorize the administration of contrast media per Radiology protocol    Answer:   Yes    Order Specific Question:   What is the patient's sedation requirement?    Answer:   Oral Sedation    Order Specific Question:   Does the patient have a pacemaker or implanted devices?    Answer:   No    Order Specific Question:   Radiology Contrast Protocol - do NOT remove file path    Answer:   \\[email or web address]    Order Specific Question:   Reason for Exam additional comments    Answer:   Soft tissue mass right shoulder    Order Specific Question:   Preferred imaging location?    Answer:   [facility] (table limit-550lbs)   No orders of the defined types were placed in this encounter.

## 2016-11-15 LAB — CERVICOVAGINAL ANCILLARY ONLY
BACTERIAL VAGINITIS: POSITIVE — AB
Candida vaginitis: POSITIVE — AB
Chlamydia: NEGATIVE
NEISSERIA GONORRHEA: NEGATIVE
TRICH (WINDOWPATH): NEGATIVE

## 2016-11-16 ENCOUNTER — Other Ambulatory Visit: Payer: Self-pay | Admitting: Obstetrics

## 2016-11-16 DIAGNOSIS — B373 Candidiasis of vulva and vagina: Secondary | ICD-10-CM

## 2016-11-16 DIAGNOSIS — N760 Acute vaginitis: Principal | ICD-10-CM

## 2016-11-16 DIAGNOSIS — B9689 Other specified bacterial agents as the cause of diseases classified elsewhere: Secondary | ICD-10-CM

## 2016-11-16 DIAGNOSIS — B3731 Acute candidiasis of vulva and vagina: Secondary | ICD-10-CM

## 2016-11-16 MED ORDER — SECNIDAZOLE 2 G PO PACK: 1.0000 | PACK | Freq: Once | ORAL | 2 refills | Status: DC

## 2016-11-16 MED ORDER — FLUCONAZOLE 150 MG PO TABS: 150.0000 mg | ORAL_TABLET | Freq: Once | ORAL | 0 refills | Status: DC

## 2016-11-17 ENCOUNTER — Telehealth: Payer: Self-pay | Admitting: Pediatrics

## 2016-11-17 NOTE — Telephone Encounter (Signed)
Pharmacy faxed PA request for Solosec.    PA submitted, awaiting response from [facility]

## 2016-11-21 ENCOUNTER — Other Ambulatory Visit: Payer: Self-pay | Admitting: *Deleted

## 2016-11-21 DIAGNOSIS — B9689 Other specified bacterial agents as the cause of diseases classified elsewhere: Secondary | ICD-10-CM

## 2016-11-21 DIAGNOSIS — N760 Acute vaginitis: Principal | ICD-10-CM

## 2016-11-21 MED ORDER — METRONIDAZOLE 0.75 % VA GEL
1.0000 | Freq: Two times a day (BID) | VAGINAL | 0 refills | Status: DC
Start: 2016-11-21 — End: 2017-04-15

## 2017-03-31 ENCOUNTER — Emergency Department (HOSPITAL_COMMUNITY)
Admission: EM | Admit: 2017-03-31 | Discharge: 2017-03-31 | Disposition: A | Discharge status: Home / Self Care | Payer: Medicare Other | Attending: Emergency Medicine | Admitting: Emergency Medicine

## 2017-03-31 ENCOUNTER — Encounter (HOSPITAL_COMMUNITY): Payer: Self-pay | Admitting: *Deleted

## 2017-03-31 DIAGNOSIS — F17210 Nicotine dependence, cigarettes, uncomplicated: Secondary | ICD-10-CM | POA: Diagnosis not present

## 2017-03-31 DIAGNOSIS — Z7982 Long term (current) use of aspirin: Secondary | ICD-10-CM | POA: Insufficient documentation

## 2017-03-31 DIAGNOSIS — M5442 Lumbago with sciatica, left side: Secondary | ICD-10-CM | POA: Diagnosis not present

## 2017-03-31 DIAGNOSIS — T7840XA Allergy, unspecified, initial encounter: Secondary | ICD-10-CM | POA: Insufficient documentation

## 2017-03-31 DIAGNOSIS — I10 Essential (primary) hypertension: Secondary | ICD-10-CM | POA: Insufficient documentation

## 2017-03-31 DIAGNOSIS — K0889 Other specified disorders of teeth and supporting structures: Secondary | ICD-10-CM | POA: Diagnosis not present

## 2017-03-31 DIAGNOSIS — Z79899 Other long term (current) drug therapy: Secondary | ICD-10-CM | POA: Insufficient documentation

## 2017-03-31 LAB — URINALYSIS, ROUTINE W REFLEX MICROSCOPIC
Bilirubin Urine: NEGATIVE
Glucose, UA: NEGATIVE mg/dL
KETONES UR: NEGATIVE mg/dL
NITRITE: NEGATIVE
PH: 6 (ref 5.0–8.0)
PROTEIN: NEGATIVE mg/dL
Specific Gravity, Urine: 1.005 (ref 1.005–1.030)

## 2017-03-31 MED ORDER — LORATADINE 10 MG PO TABS
10.0000 mg | ORAL_TABLET | Freq: Every day | ORAL | Status: DC
  Administered 2017-03-31: 10 mg via ORAL
  Filled 2017-03-31: qty 1

## 2017-03-31 MED ORDER — CETIRIZINE HCL 10 MG PO CAPS: 10.0000 mg | ORAL_CAPSULE | Freq: Every day | ORAL | 0 refills | Status: DC

## 2017-03-31 MED ORDER — FAMOTIDINE 40 MG PO TABS: 40.0000 mg | ORAL_TABLET | Freq: Every day | ORAL | 0 refills | Status: DC

## 2017-03-31 NOTE — ED Triage Notes (Signed)
To ED for eval of left hand swelling and left eye/eye brow swelling since waking this am. Thinks may have been bitten by something during the night. Also with left lower tooth broken. Complains of lower back pain as well with radiation down right leg.

## 2017-03-31 NOTE — Discharge Instructions (Signed)
Please take zyrtec 10mg  daily to help with your allergic reaction. I have also written you a prescription for pepcid as well, as this can also help with allergies.   Please place will compresses on the area of swelling.  Return to the emergency department if you have lip swelling, throat closing, trouble breathing, fever >100.42F or have any new or concerning symptoms.

## 2017-03-31 NOTE — ED Provider Notes (Signed)
[place] EMERGENCY DEPARTMENT Provider Note   CSN: [id number] Arrival date & time: 03/31/17  2595     History   Chief Complaint Chief Complaint  Patient presents with  . Back Pain  . Leg Pain  . hand swelling  . Facial Swelling    HPI April Mathis is a 67 y.o. female.  HPI   April Mathis is a 67 year old female with a history of hypertension, obesity and tobacco use who presents to the emergency department for evaluation of multiple complaints.  Patient first states that she has itchiness and swelling over her left eyelid and the left temple.  Reports that she slept on her synthetic hair and this has given her itching/swelling in the past.  She also reports that she has itching and swelling on her left hand.  She denies pain over these areas.  She denies fevers, chills, eye pain, visual disturbance, lip swelling, throat closing or shortness of breath.  Patient also reports that she has left lower dental pain over where she has a cracked tooth.  States that her pain is mild in severity, worsened with chewing on that side.  Reports that she has a dentist appointment coming up.  She has not taken any over-the-counter medication for symptoms.  Finally, patient reports that she has had left-sided lower back pain which radiates down the left buttocks for the past 3 days now.  Pain is worsened with walking or bending at the hip.  She denies inciting injury.  Denies history of cancer or IV drug use.  Denies numbness, weakness, saddle anesthesia, loss of bowel or bladder control, urinary frequency, dysuria, abdominal pain, nausea/vomiting.  Is able to ambulate independently despite pain.  Past Medical History:  Diagnosis Date  . Bilateral leg edema   . DVT of lower extremity (deep venous thrombosis) ([place])   . History of recurrent UTIs   . Hypertension   . Knee pain   . Left knee DJD 2002   MRI done in 2002  . Obesity   . Smoking   . Varicose vein     Patient  Active Problem List   Diagnosis Date Noted  . Chronic pain of left knee 06/29/2016  . Cervical spine pain 05/26/2016  . Lipoma of right upper extremity 05/26/2016  . Spondylolysis, cervical region 12/03/2015  . Vaginitis 06/24/2013  . Stressful life event affecting family 02/20/2013  . DUB (dysfunctional uterine bleeding) 12/05/2011  . Varicose veins of lower extremities with other complications 63/87/5643  . Preventative health care 08/11/2010  . PREDIABETES 08/17/2009  . Unilateral primary osteoarthritis, left knee 12/28/2005  . TOBACCO ABUSE 12/01/2005  . HYPERTENSION 12/01/2005    Past Surgical History:  Procedure Laterality Date  . KNEE ARTHROSCOPY    . TUBAL LIGATION      OB History    Gravida Para Term Preterm AB Living   6 4 4  0 2 4   SAB TAB Ectopic Multiple Live Births   1 1 0 0 4       Home Medications    Prior to Admission medications   Medication Sig Start Date End Date Taking? Authorizing Provider  aspirin EC 81 MG tablet Take 81 mg by mouth daily.     [provider]  clotrimazole (LOTRIMIN) 1 % cream Apply 1 application topically 2 (two) times daily. 10/24/16   Shelly Bombard, MD  cyclobenzaprine (FLEXERIL) 5 MG tablet Take 5 mg by mouth 3 (three) times daily as needed for  pain. 01/16/16   [provider]  ibuprofen (ADVIL,MOTRIN) 800 MG tablet Take 1 tablet (800 mg total) by mouth 3 (three) times daily. Patient taking differently: Take 800 mg by mouth every 8 (eight) hours as needed for moderate pain.  10/19/15   Shary Decamp, PA-C  lisinopril (PRINIVIL,ZESTRIL) 20 MG tablet Take 20 mg by mouth daily.    [provider]  metroNIDAZOLE (METROGEL VAGINAL) 0.75 % vaginal gel Place 1 Applicatorful 2 (two) times daily vaginally. For 5 days 11/21/16   Shelly Bombard, MD  Oxycodone HCl 10 MG TABS Take 10 mg by mouth 3 (three) times daily as needed (for pain).     [provider]    Family History Family History  Problem  Relation Age of Onset  . Cancer Mother        LUNG  . Anesthesia problems Neg Hx     Social History Social History   Tobacco Use  . Smoking status: Current Every Day Smoker    Packs/day: 1.00    Years: 20.00    Pack years: 20.00    Types: Cigarettes  . Smokeless tobacco: Never Used  . Tobacco comment: 1/2 PPD  Substance Use Topics  . Alcohol use: Yes    Comment: "every now and then, not on a regular basis   . Drug use: No     Allergies   Hctz [hydrochlorothiazide] and Penicillins   Review of Systems Review of Systems  Constitutional: Negative for chills and fever.  HENT: Positive for dental problem (left lower tooth pain) and facial swelling (left lid and temple). Negative for sore throat, trouble swallowing and voice change.   Eyes: Negative for pain, redness and visual disturbance.  Respiratory: Negative for shortness of breath and wheezing.   Gastrointestinal: Negative for abdominal pain, nausea and vomiting.  Genitourinary: Negative for difficulty urinating, dysuria, flank pain and frequency.  Musculoskeletal: Positive for back pain (left lower). Negative for gait problem and neck pain.  Skin: Negative for color change and rash.  Neurological: Negative for weakness and numbness.     Physical Exam Updated Vital Signs BP (!) 153/82 (BP Location: Left Arm)   Pulse 90   Temp 98.4 F (36.9 C) (Oral)   Resp 16   SpO2 100%   Physical Exam  Constitutional: She is oriented to person, place, and time. She appears well-developed and well-nourished. No distress.  HENT:  Head: Normocephalic and atraumatic.  Mouth/Throat:    Swelling noted on the left upper lid which extends over the left temple.  No erythema, warmth or induration.  No break in skin.  It is not tender to the touch.  See picture below.  No angioedema. Dental cavities and poor oral dentition noted. Pain along tooth as depicted in image. No abscess noted. Midline uvula. No trismus. OP moist and clear. No  oropharyngeal erythema or edema. Neck supple with no tenderness. Airway patent and able to handle oral secretions.  Eyes: Right eye exhibits no discharge. Left eye exhibits no discharge.  PERRL. EOM full, without pain. No scleral erythema or icterus.    Neck: Normal range of motion. Neck supple.  Cardiovascular: Normal rate, regular rhythm and intact distal pulses. Exam reveals no friction rub.  No murmur heard. Pulmonary/Chest: Effort normal and breath sounds normal. No stridor. No respiratory distress. She has no wheezes. She has no rales.  Abdominal: Soft. Bowel sounds are normal. There is no tenderness. There is no guarding.  Musculoskeletal:  No midline T-spine or  L-spine tenderness.  Tender to palpation over the left paraspinal muscles of the lumbar spine.  No overlying rash or ecchymosis.  Strength 5/5 in bilateral knee flexion/extension.  DP pulses 2+ bilaterally.  Left hand with mild swelling over the base of the thumb (see picture below.) No erythema, induration, warmth or break in skin. No tenderness over the hand or wrist. Full ROM of the fingers and wrist without pain. Grip strength 5/5 in bilateral UE. Cap refill <2sec.   Lymphadenopathy:    She has no cervical adenopathy.  Neurological: She is alert and oriented to person, place, and time. Coordination normal.  Patellar reflex 1+ and symmetric bilaterally.  Distal sensation to light touch intact in bilateral lower extremities.  Gait normal and coordination and balance, although painful.  Skin: Skin is warm and dry. She is not diaphoretic.  Psychiatric: She has a normal mood and affect. Her behavior is normal.  Nursing note and vitals reviewed.        ED Treatments / Results  Labs (all labs ordered are listed, but only abnormal results are displayed) Labs Reviewed  URINALYSIS, ROUTINE W REFLEX MICROSCOPIC - Abnormal; Notable for the following components:      Result Value   APPearance HAZY (*)    Hgb urine dipstick  SMALL (*)    Leukocytes, UA LARGE (*)    Bacteria, UA MANY (*)    Squamous Epithelial / LPF 0-5 (*)    All other components within normal limits    EKG  EKG Interpretation None       Radiology No results found.  Procedures Procedures (including critical care time)  Medications Ordered in ED Medications - No data to display   Initial Impression / Assessment and Plan / ED Course  I have reviewed the triage vital signs and the nursing notes.  Pertinent labs & imaging results that were available during my care of the patient were reviewed by me and considered in my medical decision making (see chart for details).    Presents with multiple complaints  Eye lid and hand swelling Began overnight after laying on her synthetic hair. She reports pruritis overlying the areas of swelling. States that this has happened before. No erythema, warmth or overlying tenderness to suggest infection. No angioedema, throat closing or trouble breathing. Will treat as an allergic reaction. Patient refuses steroid. Discussed this patient with Dr. Johnney Killian who suggests cold compresses and anti-histamines. Have discussed this with patient who agrees. Counseled her on strict return precautions and she agrees.   Back pain Patient with back pain. No neurological deficits and normal neuro exam.  Patient can walk but states is painful. No loss of bowel or bladder control. No concern for cauda equina.  No h/o cancer, IVDU. RICE protocol and pain medicine indicated and discussed with patient.    Tooth pain.  Patient with toothache.  No gross abscess.  Exam unconcerning for Ludwig's angina or spread of infection.  Will treat with anti-inflammatories medicine.  Urged patient to follow-up with dentist.    Final Clinical Impressions(s) / ED Diagnoses   Final diagnoses:  Allergic reaction, initial encounter  Pain, dental  Acute left-sided low back pain with left-sided sciatica    ED Discharge Orders         Ordered    Cetirizine HCl (ZYRTEC ALLERGY) 10 MG CAPS  Daily     03/31/17 1507    famotidine (PEPCID) 40 MG tablet  Daily     03/31/17 1507  Glyn Ade, PA-C 04/03/17 1030    Charlesetta Shanks, MD 04/09/17 [phone]

## 2017-03-31 NOTE — ED Notes (Signed)
Pt approached nurses station and asked this RN what we were planning to do about her swollen eye. I explained to the patient I was not the nurse caring for her so I was unsure of plan but would speak with her nurse. PT then stated "this hospital is a piece of shit. You might as well go home and die since you don't know nothing". Pt asked to return to room and PA notified of pt concern

## 2017-04-05 ENCOUNTER — Ambulatory Visit: Payer: Medicare Other | Admitting: Obstetrics

## 2017-04-09 ENCOUNTER — Other Ambulatory Visit: Payer: Self-pay

## 2017-04-09 ENCOUNTER — Emergency Department (HOSPITAL_COMMUNITY): Payer: Medicare Other

## 2017-04-09 ENCOUNTER — Emergency Department (HOSPITAL_COMMUNITY)
Admission: EM | Admit: 2017-04-09 | Discharge: 2017-04-10 | Disposition: A | Discharge status: Home / Self Care | Payer: Medicare Other | Attending: Emergency Medicine | Admitting: Emergency Medicine

## 2017-04-09 DIAGNOSIS — M545 Low back pain, unspecified: Secondary | ICD-10-CM

## 2017-04-09 DIAGNOSIS — Z79899 Other long term (current) drug therapy: Secondary | ICD-10-CM | POA: Insufficient documentation

## 2017-04-09 DIAGNOSIS — I10 Essential (primary) hypertension: Secondary | ICD-10-CM | POA: Insufficient documentation

## 2017-04-09 DIAGNOSIS — Z7982 Long term (current) use of aspirin: Secondary | ICD-10-CM | POA: Insufficient documentation

## 2017-04-09 DIAGNOSIS — F17210 Nicotine dependence, cigarettes, uncomplicated: Secondary | ICD-10-CM | POA: Insufficient documentation

## 2017-04-09 MED ORDER — OXYCODONE HCL 5 MG PO TABS
5.0000 mg | ORAL_TABLET | Freq: Once | ORAL | Status: AC
  Administered 2017-04-09: 5 mg via ORAL
  Filled 2017-04-09: qty 1

## 2017-04-09 MED ORDER — DIAZEPAM 2 MG PO TABS
2.0000 mg | ORAL_TABLET | Freq: Once | ORAL | Status: AC
  Administered 2017-04-09: 2 mg via ORAL
  Filled 2017-04-09: qty 1

## 2017-04-09 MED ORDER — KETOROLAC TROMETHAMINE 60 MG/2ML IM SOLN
15.0000 mg | Freq: Once | INTRAMUSCULAR | Status: AC
  Administered 2017-04-09: 15 mg via INTRAMUSCULAR
  Filled 2017-04-09: qty 2

## 2017-04-09 MED ORDER — ACETAMINOPHEN 500 MG PO TABS
1000.0000 mg | ORAL_TABLET | Freq: Once | ORAL | Status: AC
  Administered 2017-04-09: 1000 mg via ORAL
  Filled 2017-04-09: qty 2

## 2017-04-09 NOTE — ED Triage Notes (Signed)
Pt states that she is prone to UTI that cause lower back pain. Pt rates pain 10/10. Per EMS personnel pt was ambulatory upon their arrival. Pt hasn't taken any medicines for pain. Pt was seen at [facility] on 3/15 with similar CC.

## 2017-04-09 NOTE — Discharge Instructions (Signed)
Take tylenol 1000mg (2 extra strength) four times a day. Follow up with your PCP.

## 2017-04-09 NOTE — ED Notes (Signed)
Pt requesting to wait on urinalysis results before being discharged.

## 2017-04-09 NOTE — ED Provider Notes (Signed)
[facility] DEPT Provider Note   CSN: [id number] Arrival date & time: 04/09/17  2031     History   Chief Complaint Chief Complaint  Patient presents with  . Back Pain    HPI April Mathis is a 67 y.o. female.  67 yO F with a chief complaint of low back pain.  She feels that this is come and gone.  She was seen about 2 weeks ago for a similar thing though she had multiple complaints at that visit.  She states that they did nothing about her back pain but it went away on its own.  She thinks is related to urinary symptoms though she is not having any urinary symptoms currently and did not have urinary symptoms previously.  She denies fevers or chills denies loss of bowel or bladder denies recent injury or surgery.  Denies IV drug abuse or history of cancer.  She has been ambulatory.  She has not tried anything for this at home.  She denies fevers or chills.  Pain is worse with movement palpation twisting.  Denies radiation of the pain.  Sharp and stabbing 10 out of 10.  The history is provided by the patient.  Back Pain   This is a new problem. The current episode started 2 days ago. The problem occurs constantly. The problem has been gradually worsening. The pain is associated with lifting heavy objects. The pain is present in the lumbar spine. The quality of the pain is described as stabbing and shooting. The pain does not radiate. The pain is at a severity of 10/10. The pain is severe. The symptoms are aggravated by bending, twisting and certain positions. Pertinent negatives include no chest pain, no fever, no headaches and no dysuria. She has tried nothing for the symptoms. The treatment provided no relief.    Past Medical History:  Diagnosis Date  . Bilateral leg edema   . DVT of lower extremity (deep venous thrombosis) ([place])   . History of recurrent UTIs   . Hypertension   . Knee pain   . Left knee DJD 2002   MRI done in 2002  . Obesity   .  Smoking   . Varicose vein     Patient Active Problem List   Diagnosis Date Noted  . Chronic pain of left knee 06/29/2016  . Cervical spine pain 05/26/2016  . Lipoma of right upper extremity 05/26/2016  . Spondylolysis, cervical region 12/03/2015  . Vaginitis 06/24/2013  . Stressful life event affecting family 02/20/2013  . DUB (dysfunctional uterine bleeding) 12/05/2011  . Varicose veins of lower extremities with other complications 62/83/1517  . Preventative health care 08/11/2010  . PREDIABETES 08/17/2009  . Unilateral primary osteoarthritis, left knee 12/28/2005  . TOBACCO ABUSE 12/01/2005  . HYPERTENSION 12/01/2005    Past Surgical History:  Procedure Laterality Date  . KNEE ARTHROSCOPY    . TUBAL LIGATION       OB History    Gravida  6   Para  4   Term  4   Preterm  0   AB  2   Living  4     SAB  1   TAB  1   Ectopic  0   Multiple  0   Live Births  4            Home Medications    Prior to Admission medications   Medication Sig Start Date End Date Taking? Authorizing Provider  aspirin  EC 81 MG tablet Take 81 mg by mouth daily.     [provider]  Cetirizine HCl (ZYRTEC ALLERGY) 10 MG CAPS Take 1 capsule (10 mg total) by mouth daily. 03/31/17   Glyn Ade, PA-C  clotrimazole (LOTRIMIN) 1 % cream Apply 1 application topically 2 (two) times daily. 10/24/16   Shelly Bombard, MD  cyclobenzaprine (FLEXERIL) 5 MG tablet Take 5 mg by mouth 3 (three) times daily as needed for pain. 01/16/16   [provider]  famotidine (PEPCID) 40 MG tablet Take 1 tablet (40 mg total) by mouth daily. 03/31/17   Glyn Ade, PA-C  ibuprofen (ADVIL,MOTRIN) 800 MG tablet Take 1 tablet (800 mg total) by mouth 3 (three) times daily. Patient taking differently: Take 800 mg by mouth every 8 (eight) hours as needed for moderate pain.  10/19/15   Shary Decamp, PA-C  lisinopril (PRINIVIL,ZESTRIL) 20 MG tablet Take 20 mg by mouth daily.     [provider]  metroNIDAZOLE (METROGEL VAGINAL) 0.75 % vaginal gel Place 1 Applicatorful 2 (two) times daily vaginally. For 5 days 11/21/16   Shelly Bombard, MD  Oxycodone HCl 10 MG TABS Take 10 mg by mouth 3 (three) times daily as needed (for pain).     [provider]    Family History Family History  Problem Relation Age of Onset  . Cancer Mother        LUNG  . Anesthesia problems Neg Hx     Social History Social History   Tobacco Use  . Smoking status: Current Every Day Smoker    Packs/day: 1.00    Years: 20.00    Pack years: 20.00    Types: Cigarettes  . Smokeless tobacco: Never Used  . Tobacco comment: 1/2 PPD  Substance Use Topics  . Alcohol use: Yes    Comment: "every now and then, not on a regular basis   . Drug use: No     Allergies   Hctz [hydrochlorothiazide] and Penicillins   Review of Systems Review of Systems  Constitutional: Negative for chills and fever.  HENT: Negative for congestion and rhinorrhea.   Eyes: Negative for redness and visual disturbance.  Respiratory: Negative for shortness of breath and wheezing.   Cardiovascular: Negative for chest pain and palpitations.  Gastrointestinal: Negative for nausea and vomiting.  Genitourinary: Negative for dysuria and urgency.  Musculoskeletal: Positive for back pain. Negative for arthralgias and myalgias.  Skin: Negative for pallor and wound.  Neurological: Negative for dizziness and headaches.     Physical Exam Updated Vital Signs BP (!) 161/74 (BP Location: Left Arm)   Pulse 88   Temp 98.8 F (37.1 C) (Oral)   Resp 16   Ht 5\' 2"  (1.575 m)   Wt 83 kg (183 lb)   SpO2 96%   BMI 33.47 kg/m   Physical Exam  Constitutional: She is oriented to person, place, and time. She appears well-developed and well-nourished. No distress.  HENT:  Head: Normocephalic and atraumatic.  Eyes: Pupils are equal, round, and reactive to light. EOM are normal.  Neck: Normal range of motion.  Neck supple.  Cardiovascular: Normal rate and regular rhythm. Exam reveals no gallop and no friction rub.  No murmur heard. Pulmonary/Chest: Effort normal. She has no wheezes. She has no rales.  Abdominal: Soft. She exhibits no distension. There is no tenderness.  Musculoskeletal: She exhibits no edema or tenderness.  I am unable to reproduce the patient's pain, she points to the  entire low back of her area of pain.  Pulse motor and sensation is intact to bilateral lower extremities.  Ambulatory.  Neurological: She is alert and oriented to person, place, and time.  Skin: Skin is warm and dry. She is not diaphoretic.  Psychiatric: She has a normal mood and affect. Her behavior is normal.  Nursing note and vitals reviewed.    ED Treatments / Results  Labs (all labs ordered are listed, but only abnormal results are displayed) Labs Reviewed  URINALYSIS, ROUTINE W REFLEX MICROSCOPIC    EKG None  Radiology Dg Lumbar Spine Complete  Result Date: 04/09/2017 CLINICAL DATA:  Low back pain several weeks.  No injury. EXAM: LUMBAR SPINE - COMPLETE 4+ VIEW COMPARISON:  05/24/2015 FINDINGS: Vertebral body heights are maintained. There is mild spondylosis throughout the lumbar spine to include moderate facet arthropathy. Grade 1 anterolisthesis of L4 on L5 unchanged. Mild disc space narrowing at the L4-5 level. No compression fracture. Calcified plaque over the abdominal aorta. 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 Grade 1 anterolisthesis of L4 on L5 due to moderate facet arthropathy unchanged. Electronically Signed   By: Marin Olp M.D.   On: 04/09/2017 22:10    Procedures Procedures (including critical care time)  Medications Ordered in ED Medications  acetaminophen (TYLENOL) tablet 1,000 mg (1,000 mg Oral Given 04/09/17 2113)  ketorolac (TORADOL) injection 15 mg (15 mg Intramuscular Given 04/09/17 2114)  oxyCODONE (Oxy  IR/ROXICODONE) immediate release tablet 5 mg (5 mg Oral Given 04/09/17 2113)  diazepam (VALIUM) tablet 2 mg (2 mg Oral Given 04/09/17 2113)     Initial Impression / Assessment and Plan / ED Course  I have reviewed the triage vital signs and the nursing notes.  Pertinent labs & imaging results that were available during my care of the patient were reviewed by me and considered in my medical decision making (see chart for details).     67 yo F with a chief complaint of low back pain.  This been going on since yesterday.  The patient thinks that maybe she pulled something when she tried to pull the sheet from far away.  She denies radiation denies loss of bowel or bladder.  There is no red flags.  Due to the patient's age obtain a screening film.  Most likely muscular skeletal in nature.  X-ray reviewed by me and negative.  Discharge home.  11:04 PM:  I have discussed the diagnosis/risks/treatment options with the patient and believe the pt to be eligible for discharge home to follow-up with PCP. We also discussed returning to the ED immediately if new or worsening sx occur. We discussed the sx which are most concerning (e.g., sudden worsening pain, fever, inability to tolerate by mouth) that necessitate immediate return. Medications administered to the patient during their visit and any new prescriptions provided to the patient are listed below.  Medications given during this visit Medications  acetaminophen (TYLENOL) tablet 1,000 mg (1,000 mg Oral Given 04/09/17 2113)  ketorolac (TORADOL) injection 15 mg (15 mg Intramuscular Given 04/09/17 2114)  oxyCODONE (Oxy IR/ROXICODONE) immediate release tablet 5 mg (5 mg Oral Given 04/09/17 2113)  diazepam (VALIUM) tablet 2 mg (2 mg Oral Given 04/09/17 2113)     The patient appears reasonably screen and/or stabilized for discharge and I doubt any other medical condition or other [facility] requiring further screening, evaluation, or treatment in the ED at this  time prior to  discharge.    Final Clinical Impressions(s) / ED Diagnoses   Final diagnoses:  Acute bilateral low back pain without sciatica    ED Discharge Orders    None       Deno Etienne, DO 04/09/17 2304

## 2017-04-09 NOTE — ED Notes (Signed)
Bed: CH40 Expected date:  Expected time:  Means of arrival:  Comments: 67 f back pain

## 2017-04-13 ENCOUNTER — Ambulatory Visit (INDEPENDENT_AMBULATORY_CARE_PROVIDER_SITE_OTHER): Payer: Medicare Other | Admitting: Obstetrics

## 2017-04-13 ENCOUNTER — Other Ambulatory Visit (HOSPITAL_COMMUNITY)
Admission: RE | Admit: 2017-04-13 | Discharge: 2017-04-13 | Disposition: A | Discharge status: Home / Self Care | Payer: Medicare Other | Source: Ambulatory Visit | Attending: Obstetrics | Admitting: Obstetrics

## 2017-04-13 ENCOUNTER — Encounter: Payer: Self-pay | Admitting: Obstetrics

## 2017-04-13 VITALS — BP 174/87 | HR 80 | Wt 187.0 lb

## 2017-04-13 DIAGNOSIS — M545 Low back pain: Secondary | ICD-10-CM

## 2017-04-13 DIAGNOSIS — N898 Other specified noninflammatory disorders of vagina: Secondary | ICD-10-CM | POA: Diagnosis not present

## 2017-04-13 DIAGNOSIS — B9689 Other specified bacterial agents as the cause of diseases classified elsewhere: Secondary | ICD-10-CM | POA: Diagnosis not present

## 2017-04-13 DIAGNOSIS — R300 Dysuria: Secondary | ICD-10-CM

## 2017-04-13 LAB — POCT URINALYSIS DIPSTICK
Bilirubin, UA: NEGATIVE
Glucose, UA: NEGATIVE
Ketones, UA: NEGATIVE
NITRITE UA: POSITIVE
PH UA: 6 (ref 5.0–8.0)
Spec Grav, UA: 1.015 (ref 1.010–1.025)
Urobilinogen, UA: 0.2 E.U./dL

## 2017-04-13 MED ORDER — CEFUROXIME AXETIL 500 MG PO TABS: 500.0000 mg | ORAL_TABLET | Freq: Two times a day (BID) | ORAL | 0 refills | Status: DC

## 2017-04-13 MED ORDER — IBUPROFEN 800 MG PO TABS: 800.0000 mg | ORAL_TABLET | Freq: Three times a day (TID) | ORAL | 5 refills | Status: DC | PRN

## 2017-04-13 NOTE — Progress Notes (Signed)
Patient ID: April Mathis, female   DOB: [birth year], 67 y.o.   MRN: [id number]  Chief Complaint  Patient presents with  . Back Pain  . Dysuria    UA   . Vaginal Discharge    pt request STD screening     HPI April Mathis is a 67 y.o. female.  BACKACHE.  Right lower back pain radiating down leg with urination.  Denies fever / chills. HPI  Past Medical History:  Diagnosis Date  . Bilateral leg edema   . DVT of lower extremity (deep venous thrombosis) ([place])   . History of recurrent UTIs   . Hypertension   . Knee pain   . Left knee DJD 2002   MRI done in 2002  . Obesity   . Smoking   . Varicose vein     Past Surgical History:  Procedure Laterality Date  . KNEE ARTHROSCOPY    . TUBAL LIGATION      Family History  Problem Relation Age of Onset  . Cancer Mother        LUNG  . Anesthesia problems Neg Hx     Social History Social History   Tobacco Use  . Smoking status: Current Every Day Smoker    Packs/day: 1.00    Years: 20.00    Pack years: 20.00    Types: Cigarettes  . Smokeless tobacco: Never Used  . Tobacco comment: 1/2 PPD  Substance Use Topics  . Alcohol use: Yes    Comment: "every now and then, not on a regular basis   . Drug use: No    Allergies  Allergen Reactions  . Hctz [Hydrochlorothiazide] Other (See Comments)    Reaction:  Drops pts BP  . Penicillins Hives and Other (See Comments)    Has patient had a PCN reaction causing immediate rash, facial/tongue/throat swelling, SOB or lightheadedness with hypotension: No Has patient had a PCN reaction causing severe rash involving mucus membranes or skin necrosis: No Has patient had a PCN reaction that required hospitalization No Has patient had a PCN reaction occurring within the last 10 years: No If all of the above answers are "NO", then may proceed with Cephalosporin use.    Current Outpatient Medications  Medication Sig Dispense Refill  . acetaminophen (TYLENOL) 500 MG tablet Take 500 mg by  mouth every 6 (six) hours as needed.    Marland Kitchen aspirin EC 81 MG tablet Take 81 mg by mouth daily.     . Cetirizine HCl (ZYRTEC ALLERGY) 10 MG CAPS Take 1 capsule (10 mg total) by mouth daily. 30 capsule 0  . cyclobenzaprine (FLEXERIL) 5 MG tablet Take 5 mg by mouth 3 (three) times daily as needed for pain.    Marland Kitchen lisinopril (PRINIVIL,ZESTRIL) 20 MG tablet Take 20 mg by mouth daily.    . cefUROXime (CEFTIN) 500 MG tablet Take 1 tablet (500 mg total) by mouth 2 (two) times daily with a meal. 14 tablet 0  . clotrimazole (LOTRIMIN) 1 % cream Apply 1 application topically 2 (two) times daily. (Patient not taking: Reported on 04/13/2017) 60 g 2  . famotidine (PEPCID) 40 MG tablet Take 1 tablet (40 mg total) by mouth daily. 30 tablet 0  . ibuprofen (ADVIL,MOTRIN) 800 MG tablet Take 1 tablet (800 mg total) by mouth every 8 (eight) hours as needed. 30 tablet 5  . metroNIDAZOLE (METROGEL VAGINAL) 0.75 % vaginal gel Place 1 Applicatorful 2 (two) times daily vaginally. For 5 days (Patient not taking: Reported on  04/13/2017) 70 g 0  . Oxycodone HCl 10 MG TABS Take 10 mg by mouth 3 (three) times daily as needed (for pain).   0   No current facility-administered medications for this visit.     Review of Systems Review of Systems Constitutional: negative for fatigue and weight loss Respiratory: negative for cough and wheezing Cardiovascular: negative for chest pain, fatigue and palpitations Gastrointestinal: negative for abdominal pain and change in bowel habits Genitourinary:positive for back pain with urination Integument/breast: negative for nipple discharge Musculoskeletal:positive for myalgias Neurological: negative for gait problems and tremors Behavioral/Psych: negative for abusive relationship, depression Endocrine: negative for temperature intolerance      Blood pressure (!) 174/87, pulse 80, weight 187 lb (84.8 kg).  Physical Exam Physical Exam;          General:  Alert and no distress           Back:  Right CVA tenderness Abdomen:  normal findings: no organomegaly, soft, non-tender and no hernia  Pelvis:  External genitalia: normal general appearance Urinary system: urethral meatus normal and bladder without fullness, nontender Vaginal: normal without tenderness, induration or masses Cervix: normal appearance Adnexa: normal bimanual exam Uterus: anteverted and non-tender, normal size    50% of 15 min visit spent on counseling and coordination of care.   Data Reviewed Urinalysis  Results for April Mathis, April Mathis (MRN [id number]) as of 04/13/2017 13:16  Ref. Range 04/13/2017 10:15  Bilirubin, UA Unknown Neg  Clarity, UA Unknown Cloudy  Color, UA Unknown Amber  Glucose Unknown Neg  Ketones, UA Unknown Neg  Leukocytes, UA Latest Ref Range: Negative  Trace (A)  Nitrite, UA Unknown POSITIVE  pH, UA Latest Ref Range: 5.0 - 8.0  6.0  Protein, UA Unknown Trace  Specific Gravity, UA Latest Ref Range: 1.010 - 1.025  1.015  Urobilinogen, UA Latest Ref Range: 0.2 or 1.0 E.U./dL 0.2  RBC, UA Unknown Trace    Assessment     1. Dysuria Rx: - POCT Urinalysis Dipstick - Urine Culture - cefUROXime (CEFTIN) 500 MG tablet; Take 1 tablet (500 mg total) by mouth 2 (two) times daily with a meal.  Dispense: 14 tablet; Refill: 0  2. Vaginal discharge Rx: - Cervicovaginal ancillary only  3. Right low back pain, unspecified chronicity, with sciatica presence unspecified Rx: - POCT Urinalysis Dipstick - Urine Culture - ibuprofen (ADVIL,MOTRIN) 800 MG tablet; Take 1 tablet (800 mg total) by mouth every 8 (eight) hours as needed.  Dispense: 30 tablet; Refill: 5    Plan    Orders Placed This Encounter  Procedures  . Urine Culture  . POCT Urinalysis Dipstick   Meds ordered this encounter  Medications  . cefUROXime (CEFTIN) 500 MG tablet    Sig: Take 1 tablet (500 mg total) by mouth 2 (two) times daily with a meal.    Dispense:  14 tablet    Refill:  0  . ibuprofen (ADVIL,MOTRIN) 800 MG  tablet    Sig: Take 1 tablet (800 mg total) by mouth every 8 (eight) hours as needed.    Dispense:  30 tablet    Refill:  5    Shelly Bombard MD 04-13-2017

## 2017-04-13 NOTE — Progress Notes (Signed)
RGYN pt c/o constant sharp back pain on the Rt side and Right hip  Pt had X-rays done.  Pt notes she noticed a dark discharge (brownish color) for the past 2 weeks.   Per pt wants STD testing swab done   Pt c/o dysuria. Per pt no pelvic pain.  B/P elevated pt denies any HA or visual changes right now  Pt did take B/P medication today.

## 2017-04-14 LAB — CERVICOVAGINAL ANCILLARY ONLY
BACTERIAL VAGINITIS: POSITIVE — AB
Candida vaginitis: NEGATIVE
Chlamydia: NEGATIVE
NEISSERIA GONORRHEA: NEGATIVE
Trichomonas: NEGATIVE

## 2017-04-15 ENCOUNTER — Other Ambulatory Visit: Payer: Self-pay | Admitting: Obstetrics

## 2017-04-15 DIAGNOSIS — B9689 Other specified bacterial agents as the cause of diseases classified elsewhere: Secondary | ICD-10-CM

## 2017-04-15 DIAGNOSIS — N3000 Acute cystitis without hematuria: Secondary | ICD-10-CM

## 2017-04-15 DIAGNOSIS — N760 Acute vaginitis: Secondary | ICD-10-CM

## 2017-04-15 LAB — URINE CULTURE

## 2017-04-15 MED ORDER — SULFAMETHOXAZOLE-TRIMETHOPRIM 800-160 MG PO TABS: 1.0000 | ORAL_TABLET | Freq: Two times a day (BID) | ORAL | 0 refills | Status: DC

## 2017-04-15 MED ORDER — METRONIDAZOLE 0.75 % VA GEL: 1.0000 | Freq: Two times a day (BID) | VAGINAL | 0 refills | Status: DC

## 2017-05-02 ENCOUNTER — Telehealth: Payer: Self-pay

## 2017-05-02 NOTE — Telephone Encounter (Signed)
Pt called stating that she is having burning in her vagina for a couple of days. She states that she has made an appt to be seen tomorrow.

## 2017-05-03 ENCOUNTER — Ambulatory Visit (INDEPENDENT_AMBULATORY_CARE_PROVIDER_SITE_OTHER): Payer: Medicare Other | Admitting: Obstetrics

## 2017-05-03 ENCOUNTER — Encounter: Payer: Self-pay | Admitting: Obstetrics

## 2017-05-03 VITALS — BP 200/94 | HR 83 | Wt 187.0 lb

## 2017-05-03 DIAGNOSIS — B373 Candidiasis of vulva and vagina: Secondary | ICD-10-CM

## 2017-05-03 DIAGNOSIS — N898 Other specified noninflammatory disorders of vagina: Secondary | ICD-10-CM | POA: Diagnosis not present

## 2017-05-03 DIAGNOSIS — L270 Generalized skin eruption due to drugs and medicaments taken internally: Secondary | ICD-10-CM | POA: Diagnosis not present

## 2017-05-03 DIAGNOSIS — B3731 Acute candidiasis of vulva and vagina: Secondary | ICD-10-CM

## 2017-05-03 MED ORDER — TERCONAZOLE 0.8 % VA CREA: 1.0000 | TOPICAL_CREAM | Freq: Every day | VAGINAL | 2 refills | Status: DC

## 2017-05-03 NOTE — Progress Notes (Signed)
Patient ID: April Mathis, female   DOB: 1950/04/01, 67 y.o.   MRN: [id number]  Chief Complaint  Patient presents with  . Follow-up    reaction from Abx    HPI April Mathis is a 67 y.o. female.  Vaginal discharge with itching.  Also has itching of arms after taking antibiotics. HPI  Past Medical History:  Diagnosis Date  . Bilateral leg edema   . DVT of lower extremity (deep venous thrombosis) ([place])   . History of recurrent UTIs   . Hypertension   . Knee pain   . Left knee DJD 2002   MRI done in 2002  . Obesity   . Smoking   . Varicose vein     Past Surgical History:  Procedure Laterality Date  . KNEE ARTHROSCOPY    . TUBAL LIGATION      Family History  Problem Relation Age of Onset  . Cancer Mother        LUNG  . Anesthesia problems Neg Hx     Social History Social History   Tobacco Use  . Smoking status: Current Every Day Smoker    Packs/day: 1.00    Years: 20.00    Pack years: 20.00    Types: Cigarettes  . Smokeless tobacco: Never Used  . Tobacco comment: 1/2 PPD  Substance Use Topics  . Alcohol use: Yes    Comment: "every now and then, not on a regular basis   . Drug use: No    Allergies  Allergen Reactions  . Hctz [Hydrochlorothiazide] Other (See Comments)    Reaction:  Drops pts BP  . Penicillins Hives and Other (See Comments)    Has patient had a PCN reaction causing immediate rash, facial/tongue/throat swelling, SOB or lightheadedness with hypotension: No Has patient had a PCN reaction causing severe rash involving mucus membranes or skin necrosis: No Has patient had a PCN reaction that required hospitalization No Has patient had a PCN reaction occurring within the last 10 years: No If all of the above answers are "NO", then may proceed with Cephalosporin use.    Current Outpatient Medications  Medication Sig Dispense Refill  . cefUROXime (CEFTIN) 500 MG tablet Take 1 tablet (500 mg total) by mouth 2 (two) times daily with a meal. 14 tablet  0  . Cetirizine HCl (ZYRTEC ALLERGY) 10 MG CAPS Take 1 capsule (10 mg total) by mouth daily. 30 capsule 0  . cyclobenzaprine (FLEXERIL) 5 MG tablet Take 5 mg by mouth 3 (three) times daily as needed for pain.    Marland Kitchen ibuprofen (ADVIL,MOTRIN) 800 MG tablet Take 1 tablet (800 mg total) by mouth every 8 (eight) hours as needed. 30 tablet 5  . lisinopril (PRINIVIL,ZESTRIL) 20 MG tablet Take 20 mg by mouth daily.    Marland Kitchen sulfamethoxazole-trimethoprim (BACTRIM DS,SEPTRA DS) 800-160 MG tablet Take 1 tablet by mouth 2 (two) times daily. 14 tablet 0  . acetaminophen (TYLENOL) 500 MG tablet Take 500 mg by mouth every 6 (six) hours as needed.    Marland Kitchen aspirin EC 81 MG tablet Take 81 mg by mouth daily.     . clotrimazole (LOTRIMIN) 1 % cream Apply 1 application topically 2 (two) times daily. (Patient not taking: Reported on 04/13/2017) 60 g 2  . famotidine (PEPCID) 40 MG tablet Take 1 tablet (40 mg total) by mouth daily. 30 tablet 0  . metroNIDAZOLE (METROGEL VAGINAL) 0.75 % vaginal gel Place 1 Applicatorful vaginally 2 (two) times daily. For 5 days (Patient not  taking: Reported on 05/03/2017) 70 g 0  . terconazole (TERAZOL 3) 0.8 % vaginal cream Place 1 applicator vaginally at bedtime. 20 g 2   No current facility-administered medications for this visit.     Review of Systems Review of Systems Constitutional: negative for fatigue and weight loss Respiratory: negative for cough and wheezing Cardiovascular: negative for chest pain, fatigue and palpitations Gastrointestinal: negative for abdominal pain and change in bowel habits Genitourinary:positive for vaginal discharge with itching Integument/breast: positive for itching of arms.  negative for nipple discharge Musculoskeletal:negative for myalgias Neurological: negative for gait problems and tremors Behavioral/Psych: negative for abusive relationship, depression Endocrine: negative for temperature intolerance      Blood pressure (!) 200/94, pulse 83, weight  187 lb (84.8 kg).  Physical Exam Physical Exam General:   alert  Skin:   no rash or abnormalities  Lungs:   clear to auscultation bilaterally  Heart:   regular rate and rhythm, S1, S2 normal, no murmur, click, rub or gallop  50% of 15 min visit spent on counseling and coordination of care.   Data Reviewed Wet Prep  Assessment     1. Vaginal discharge - PROBABLE YEAST INFECTION  2. Candida vaginitis Rx: - terconazole (TERAZOL 3) 0.8 % vaginal cream; Place 1 applicator vaginally at bedtime.  Dispense: 20 g; Refill: 2  3. Dermatitis due to drug reaction - probable reaction to Ceftin.  Resolving with hydrocortisone cream and Benadryl.    Plan    Follow up in 3 months for urine TOC and allergic reaction to antibiotics  No orders of the defined types were placed in this encounter.  Meds ordered this encounter  Medications  . terconazole (TERAZOL 3) 0.8 % vaginal cream    Sig: Place 1 applicator vaginally at bedtime.    Dispense:  20 g    Refill:  2    Shelly Bombard MD 05-03-2017

## 2017-07-26 ENCOUNTER — Emergency Department (HOSPITAL_COMMUNITY)
Admission: EM | Admit: 2017-07-26 | Discharge: 2017-07-26 | Disposition: A | Discharge status: Home / Self Care | Payer: Medicare Other | Attending: Emergency Medicine | Admitting: Emergency Medicine

## 2017-07-26 ENCOUNTER — Encounter (HOSPITAL_COMMUNITY): Payer: Self-pay

## 2017-07-26 ENCOUNTER — Other Ambulatory Visit: Payer: Self-pay

## 2017-07-26 DIAGNOSIS — W57XXXA Bitten or stung by nonvenomous insect and other nonvenomous arthropods, initial encounter: Secondary | ICD-10-CM | POA: Insufficient documentation

## 2017-07-26 DIAGNOSIS — F17210 Nicotine dependence, cigarettes, uncomplicated: Secondary | ICD-10-CM | POA: Insufficient documentation

## 2017-07-26 DIAGNOSIS — I10 Essential (primary) hypertension: Secondary | ICD-10-CM | POA: Diagnosis not present

## 2017-07-26 DIAGNOSIS — Z79899 Other long term (current) drug therapy: Secondary | ICD-10-CM | POA: Diagnosis not present

## 2017-07-26 DIAGNOSIS — S60469A Insect bite (nonvenomous) of unspecified finger, initial encounter: Secondary | ICD-10-CM | POA: Insufficient documentation

## 2017-07-26 DIAGNOSIS — S00462A Insect bite (nonvenomous) of left ear, initial encounter: Secondary | ICD-10-CM | POA: Diagnosis not present

## 2017-07-26 DIAGNOSIS — Y998 Other external cause status: Secondary | ICD-10-CM | POA: Diagnosis not present

## 2017-07-26 DIAGNOSIS — Y929 Unspecified place or not applicable: Secondary | ICD-10-CM | POA: Insufficient documentation

## 2017-07-26 DIAGNOSIS — Z7982 Long term (current) use of aspirin: Secondary | ICD-10-CM | POA: Insufficient documentation

## 2017-07-26 DIAGNOSIS — Y9389 Activity, other specified: Secondary | ICD-10-CM | POA: Diagnosis not present

## 2017-07-26 MED ORDER — DOXYCYCLINE HYCLATE 100 MG PO CAPS: 100.0000 mg | ORAL_CAPSULE | Freq: Two times a day (BID) | ORAL | 0 refills | Status: DC

## 2017-07-26 MED ORDER — DOXYCYCLINE HYCLATE 100 MG PO TABS
100.0000 mg | ORAL_TABLET | Freq: Once | ORAL | Status: AC
  Administered 2017-07-26: 100 mg via ORAL
  Filled 2017-07-26: qty 1

## 2017-07-26 NOTE — ED Provider Notes (Signed)
[facility] EMERGENCY DEPARTMENT Provider Note   CSN: [id number] Arrival date & time: 07/26/17  0446     History   Chief Complaint Chief Complaint  Patient presents with  . Insect Bite    HPI April Mathis is a 67 y.o. female.  The history is provided by the patient.  Animal Bite  Contact animal:  Insect Location:  Head/neck Head/neck injury location:  L ear Pain details:    Quality:  Itching   Severity:  Moderate   Timing:  Constant   Progression:  Unchanged Incident location:  Home Provoked: unprovoked   Animal's rabies vaccination status:  Never received Animal in possession: no   Tetanus status:  Up to date Relieved by:  Nothing Worsened by:  Nothing Ineffective treatments:  None tried Associated symptoms: no fever   Bit by a large black spider on the left hand and ear and neck.  Has been on doxy and took last dose tonight and was bitten in the hours immediately following the last dose.    Past Medical History:  Diagnosis Date  . Bilateral leg edema   . DVT of lower extremity (deep venous thrombosis) ([place])   . History of recurrent UTIs   . Hypertension   . Knee pain   . Left knee DJD 2002   MRI done in 2002  . Obesity   . Smoking   . Varicose vein     Patient Active Problem List   Diagnosis Date Noted  . Chronic pain of left knee 06/29/2016  . Cervical spine pain 05/26/2016  . Lipoma of right upper extremity 05/26/2016  . Spondylolysis, cervical region 12/03/2015  . Vaginitis 06/24/2013  . Stressful life event affecting family 02/20/2013  . DUB (dysfunctional uterine bleeding) 12/05/2011  . Varicose veins of lower extremities with other complications 72/09/4707  . Preventative health care 08/11/2010  . PREDIABETES 08/17/2009  . Unilateral primary osteoarthritis, left knee 12/28/2005  . TOBACCO ABUSE 12/01/2005  . HYPERTENSION 12/01/2005    Past Surgical History:  Procedure Laterality Date  . KNEE ARTHROSCOPY    . TUBAL  LIGATION       OB History    Gravida  6   Para  4   Term  4   Preterm  0   AB  2   Living  4     SAB  1   TAB  1   Ectopic  0   Multiple  0   Live Births  4            Home Medications    Prior to Admission medications   Medication Sig Start Date End Date Taking? Authorizing Provider  acetaminophen (TYLENOL) 500 MG tablet Take 500 mg by mouth every 6 (six) hours as needed.    [provider]  aspirin EC 81 MG tablet Take 81 mg by mouth daily.     [provider]  cefUROXime (CEFTIN) 500 MG tablet Take 1 tablet (500 mg total) by mouth 2 (two) times daily with a meal. 04/13/17   Shelly Bombard, MD  Cetirizine HCl (ZYRTEC ALLERGY) 10 MG CAPS Take 1 capsule (10 mg total) by mouth daily. 03/31/17   Glyn Ade, PA-C  clotrimazole (LOTRIMIN) 1 % cream Apply 1 application topically 2 (two) times daily. Patient not taking: Reported on 04/13/2017 10/24/16   Shelly Bombard, MD  cyclobenzaprine (FLEXERIL) 5 MG tablet Take 5 mg by mouth 3 (three) times daily as needed  for pain. 01/16/16   [provider]  doxycycline (VIBRAMYCIN) 100 MG capsule Take 1 capsule (100 mg total) by mouth 2 (two) times daily. One po bid 07/26/17   Braelee Herrle, MD  famotidine (PEPCID) 40 MG tablet Take 1 tablet (40 mg total) by mouth daily. 03/31/17   Glyn Ade, PA-C  ibuprofen (ADVIL,MOTRIN) 800 MG tablet Take 1 tablet (800 mg total) by mouth every 8 (eight) hours as needed. 04/13/17   Shelly Bombard, MD  lisinopril (PRINIVIL,ZESTRIL) 20 MG tablet Take 20 mg by mouth daily.    [provider]  metroNIDAZOLE (METROGEL VAGINAL) 0.75 % vaginal gel Place 1 Applicatorful vaginally 2 (two) times daily. For 5 days Patient not taking: Reported on 05/03/2017 04/15/17   Shelly Bombard, MD  sulfamethoxazole-trimethoprim (BACTRIM DS,SEPTRA DS) 800-160 MG tablet Take 1 tablet by mouth 2 (two) times daily. 04/15/17   Shelly Bombard, MD  terconazole  (TERAZOL 3) 0.8 % vaginal cream Place 1 applicator vaginally at bedtime. 05/03/17   Shelly Bombard, MD    Family History Family History  Problem Relation Age of Onset  . Cancer Mother        LUNG  . Anesthesia problems Neg Hx     Social History Social History   Tobacco Use  . Smoking status: Current Every Day Smoker    Packs/day: 1.00    Years: 20.00    Pack years: 20.00    Types: Cigarettes  . Smokeless tobacco: Never Used  . Tobacco comment: 1/2 PPD  Substance Use Topics  . Alcohol use: Yes    Comment: "every now and then, not on a regular basis   . Drug use: No     Allergies   Hctz [hydrochlorothiazide] and Penicillins   Review of Systems Review of Systems  Constitutional: Negative for fever.  HENT: Negative for congestion and facial swelling.   Respiratory: Negative for shortness of breath.   Cardiovascular: Negative for chest pain.  Skin: Negative for wound.  All other systems reviewed and are negative.    Physical Exam Updated Vital Signs BP (!) 175/60   Pulse 70   Temp 97.9 F (36.6 C) (Oral)   Resp 16   Ht 5\' 2"  (1.575 m)   Wt 87.1 kg (192 lb)   SpO2 100%   BMI 35.12 kg/m   Physical Exam  Constitutional: She is oriented to person, place, and time. She appears well-developed and well-nourished. No distress.  HENT:  Head: Normocephalic and atraumatic.  Mouth/Throat: No oropharyngeal exudate.  No ulcerations no wounds, has a split ear lobe  Eyes: Pupils are equal, round, and reactive to light. Conjunctivae are normal.  Neck: Normal range of motion. Neck supple.  Cardiovascular: Normal rate, regular rhythm, normal heart sounds and intact distal pulses.  Pulmonary/Chest: Effort normal and breath sounds normal. No stridor. She has no wheezes. She has no rales.  Abdominal: Soft. Bowel sounds are normal. She exhibits no mass. There is no tenderness. There is no rebound and no guarding.  Musculoskeletal: Normal range of motion.  Neurological: She  is alert and oriented to person, place, and time. She displays normal reflexes.  Skin: Skin is warm and dry. Capillary refill takes less than 2 seconds.  Psychiatric: She has a normal mood and affect.     ED Treatments / Results  Labs (all labs ordered are listed, but only abnormal results are displayed) Labs Reviewed - No data to display  EKG None  Radiology No results  found.  Procedures Procedures (including critical care time)  Medications Ordered in ED Medications  doxycycline (VIBRA-TABS) tablet 100 mg (has no administration in time range)       Final Clinical Impressions(s) / ED Diagnoses   Final diagnoses:  Insect bite of finger of left hand, unspecified finger, initial encounter   Patient requested and was given doxycycline.  Does not want to try benadryl as it "does not work for me."  Have given an ice bag for comfort.    Return for pain, numbness, changes in vision or speech, fevers >100.4 unrelieved by medication, shortness of breath, intractable vomiting, or diarrhea, abdominal pain, Inability to tolerate liquids or food, cough, altered mental status or any concerns. No signs of systemic illness or infection. The patient is nontoxic-appearing on exam and vital signs are within normal limits. Will refer to urology for microscopy hematuria as patient is asymptomatic.  I have reviewed the triage vital signs and the nursing notes. Pertinent labs &imaging results that were available during my care of the patient were reviewed by me and considered in my medical decision making (see chart for details).  After history, exam, and medical workup I feel the patient has been appropriately medically screened and is safe for discharge home. Pertinent diagnoses were discussed with the patient. Patient was given return precautions.  ED Discharge Orders        Ordered    doxycycline (VIBRAMYCIN) 100 MG capsule  2 times daily     07/26/17 0541       Valin Massie,  MD 07/26/17 2863

## 2017-07-26 NOTE — ED Notes (Signed)
Ice pack given

## 2017-07-26 NOTE — ED Notes (Signed)
ED Provider at bedside. 

## 2017-07-26 NOTE — ED Notes (Addendum)
Pt stating, "I don't feel like y'all even do anything." Pt notified she has been cleared and instructions were reviewed. Pt denied any further requests. Signature pad unavailable, pt verbalized understanding.

## 2017-07-26 NOTE — ED Triage Notes (Signed)
Per [facility], pt arrives from home with complaints of spider bite to left ear and left arm x3-4 hours. Pt on doxycycline due to spider bite to left arm 1 week ago. Pt denies pain. Pt ambulatory on arrival and in no apparent distress.

## 2017-10-25 ENCOUNTER — Ambulatory Visit: Payer: Medicare Other | Admitting: Obstetrics

## 2018-03-08 ENCOUNTER — Ambulatory Visit (INDEPENDENT_AMBULATORY_CARE_PROVIDER_SITE_OTHER): Payer: Self-pay

## 2018-03-08 ENCOUNTER — Encounter (INDEPENDENT_AMBULATORY_CARE_PROVIDER_SITE_OTHER): Payer: Self-pay | Admitting: Orthopedic Surgery

## 2018-03-08 ENCOUNTER — Ambulatory Visit (INDEPENDENT_AMBULATORY_CARE_PROVIDER_SITE_OTHER): Payer: Medicare Other | Admitting: Physician Assistant

## 2018-03-08 VITALS — Ht 62.0 in | Wt 192.0 lb

## 2018-03-08 DIAGNOSIS — M25562 Pain in left knee: Secondary | ICD-10-CM

## 2018-03-08 DIAGNOSIS — G8929 Other chronic pain: Secondary | ICD-10-CM

## 2018-03-11 ENCOUNTER — Encounter (INDEPENDENT_AMBULATORY_CARE_PROVIDER_SITE_OTHER): Payer: Self-pay | Admitting: Physician Assistant

## 2018-03-11 NOTE — Progress Notes (Signed)
Office Visit Note   Patient: April Mathis           Date of Birth: 1950-09-12           MRN: [id number] Visit Date: 03/08/2018              Requested by: Nolene Ebbs, MD [address] [place], [place] [address] PCP: Nolene Ebbs, MD  Chief Complaint  Patient presents with  . Left Knee - Pain      HPI: The patient is a 68 year old woman who presents with complaints of left knee pain.  She had a history of arthroscopic surgery on the left knee about 10 years ago.  She was last seen here in 2018 and underwent a steroid injections and reports that this was helpful.  She is having complaints of left knee swelling and painful ambulation about the knee.  She has been trying to exercise some as she has a new diagnosis of diabetes to help reduce her weight but has been limited by her knee pain.  She reports pain with bending and pain going up and down stairs.  She does well her compression hose for bilateral lower extremity edema.  She does have a knee brace and reports that this helps some.  She occasionally takes some ibuprofen but she reports that this has caused her blood pressure to go up and she has had to avoid the ibuprofen orally.  She has difficulty getting around and needs it to use scat for transportation.  Assessment & Plan: Visit Diagnoses:  1. Chronic pain of left knee     Plan: The patient's moderate degenerative changes were discussed with her at length.  She does not want a cortisone shot today and we did discuss utilizing diclofenac gel and she would like to try this as this will likely have less of an effect on her blood pressure.  We discussed that she can use this up to 4 times daily if needed.  She should continue to exercise and specifically do quad strengthening exercises such as leg presses and straight leg raises to improve her quad strength and help with her knee discomfort.  She is going to follow-up here in about 4 weeks or sooner should she have difficulty in  the interim.  Follow-Up Instructions: Return in about 4 weeks (around 04/05/2018).   Ortho Exam  Patient is alert, oriented, no adenopathy, well-dressed, normal affect, normal respiratory effort. The left knee range of motion is 0 to 110 degrees.  She has pain over the joint line.  Mild patellofemoral crepitus is also noted.  She has no instability and negative drawer.  She has mild peripheral edema and reports that she normally wears compression stockings.  Imaging: No results found. No images are attached to the encounter.  Labs: Lab Results  Component Value Date   HGBA1C 5.5 06/24/2013   HGBA1C 5.4 02/20/2013   HGBA1C 5.5 10/09/2012   REPTSTATUS 06/02/2011 FINAL 05/31/2011   CULT No Herpes Simplex Virus detected. 05/31/2011   LABORGA ESCHERICHIA COLI 01/18/2010     Lab Results  Component Value Date   ALBUMIN 4.3 07/28/2009   ALBUMIN 3.9 11/25/2008   ALBUMIN 4.6 06/07/2007    Body mass index is 35.12 kg/m.  Orders:  Orders Placed This Encounter  Procedures  . XR Knee 1-2 Views Left   No orders of the defined types were placed in this encounter.    Procedures: No procedures performed  Clinical Data: No additional findings.  ROS:  All other systems negative, except as noted in the HPI. Review of Systems  Objective: Vital Signs: Ht 5\' 2"  (1.575 m)   Wt 192 lb (87.1 kg)   BMI 35.12 kg/m   Specialty Comments:  No specialty comments available.  PMFS History: Patient Active Problem List   Diagnosis Date Noted  . Chronic pain of left knee 06/29/2016  . Cervical spine pain 05/26/2016  . Lipoma of right upper extremity 05/26/2016  . Spondylolysis, cervical region 12/03/2015  . Vaginitis 06/24/2013  . Stressful life event affecting family 02/20/2013  . DUB (dysfunctional uterine bleeding) 12/05/2011  . Varicose veins of lower extremities with other complications 41/96/2229  . Preventative health care 08/11/2010  . PREDIABETES 08/17/2009  . Unilateral  primary osteoarthritis, left knee 12/28/2005  . TOBACCO ABUSE 12/01/2005  . HYPERTENSION 12/01/2005   Past Medical History:  Diagnosis Date  . Bilateral leg edema   . DVT of lower extremity (deep venous thrombosis) ([place])   . History of recurrent UTIs   . Hypertension   . Knee pain   . Left knee DJD 2002   MRI done in 2002  . Obesity   . Smoking   . Varicose vein     Family History  Problem Relation Age of Onset  . Cancer Mother        LUNG  . Anesthesia problems Neg Hx     Past Surgical History:  Procedure Laterality Date  . KNEE ARTHROSCOPY    . TUBAL LIGATION     Social History   Occupational History  . Not on file  Tobacco Use  . Smoking status: Current Every Day Smoker    Packs/day: 1.00    Years: 20.00    Pack years: 20.00    Types: Cigarettes  . Smokeless tobacco: Never Used  . Tobacco comment: 1/2 PPD  Substance and Sexual Activity  . Alcohol use: Yes    Comment: "every now and then, not on a regular basis   . Drug use: No  . Sexual activity: Yes    Birth control/protection: None

## 2018-08-07 ENCOUNTER — Other Ambulatory Visit: Payer: Self-pay | Admitting: Obstetrics

## 2018-08-07 DIAGNOSIS — B373 Candidiasis of vulva and vagina: Secondary | ICD-10-CM

## 2018-08-07 DIAGNOSIS — B3731 Acute candidiasis of vulva and vagina: Secondary | ICD-10-CM

## 2018-08-07 MED ORDER — TERCONAZOLE 0.8 % VA CREA: 1.0000 | TOPICAL_CREAM | Freq: Every day | VAGINAL | 2 refills | Status: DC

## 2018-08-08 ENCOUNTER — Other Ambulatory Visit: Payer: Self-pay | Admitting: Obstetrics

## 2018-08-08 DIAGNOSIS — B3731 Acute candidiasis of vulva and vagina: Secondary | ICD-10-CM

## 2018-08-08 DIAGNOSIS — B373 Candidiasis of vulva and vagina: Secondary | ICD-10-CM

## 2018-08-08 MED ORDER — CLOTRIMAZOLE 1 % EX CREA
1.0000 | TOPICAL_CREAM | Freq: Two times a day (BID) | CUTANEOUS | 2 refills | Status: DC
Start: 2018-08-08 — End: 2018-12-03

## 2018-08-13 ENCOUNTER — Ambulatory Visit: Payer: Medicare Other

## 2018-08-13 ENCOUNTER — Ambulatory Visit: Payer: Medicaid Other

## 2018-08-15 ENCOUNTER — Telehealth: Payer: Self-pay

## 2018-08-15 ENCOUNTER — Encounter: Payer: Self-pay | Admitting: Obstetrics

## 2018-08-15 ENCOUNTER — Ambulatory Visit (INDEPENDENT_AMBULATORY_CARE_PROVIDER_SITE_OTHER): Payer: Medicare Other | Admitting: Obstetrics

## 2018-08-15 ENCOUNTER — Ambulatory Visit: Payer: Medicaid Other | Admitting: Obstetrics & Gynecology

## 2018-08-15 DIAGNOSIS — N898 Other specified noninflammatory disorders of vagina: Secondary | ICD-10-CM | POA: Diagnosis not present

## 2018-08-15 MED ORDER — METRONIDAZOLE 500 MG PO TABS: 500.0000 mg | ORAL_TABLET | Freq: Two times a day (BID) | ORAL | 0 refills | Status: DC

## 2018-08-15 MED ORDER — AZITHROMYCIN 500 MG PO TABS: 1000.0000 mg | ORAL_TABLET | Freq: Once | ORAL | 0 refills | Status: AC

## 2018-08-15 MED ORDER — FLUCONAZOLE 150 MG PO TABS: 150.0000 mg | ORAL_TABLET | Freq: Once | ORAL | 0 refills | Status: DC

## 2018-08-15 MED ORDER — DOXYCYCLINE HYCLATE 100 MG PO CAPS: 100.0000 mg | ORAL_CAPSULE | Freq: Two times a day (BID) | ORAL | 0 refills | Status: AC

## 2018-08-15 NOTE — Telephone Encounter (Signed)
TC from pt requesting Rx for vaginal dryness.  Consulted w/ provider  Will contact pt regarding OTC  / natural methods.
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
## 2018-08-15 NOTE — Progress Notes (Signed)
   TELEHEALTH VIRTUAL GYNECOLOGY VISIT ENCOUNTER NOTE  I connected with April Mathis on 08/15/18 at 10:45 AM EDT by telephone at home and verified that I am speaking with the correct person using two identifiers.   I discussed the limitations, risks, security and privacy concerns of performing an evaluation and management service by telephone and the availability of in person appointments. I also discussed with the patient that there may be a patient responsible charge related to this service. The patient expressed understanding and agreed to proceed.   History:  April Mathis is a 68 y.o. [phone] female being evaluated today for vaginal discharge and irritation. She denies any abnormal vaginal bleeding, pelvic pain or other concerns.       Past Medical History:  Diagnosis Date  . Bilateral leg edema   . DVT of lower extremity (deep venous thrombosis) ([place])   . History of recurrent UTIs   . Hypertension   . Knee pain   . Left knee DJD 2002   MRI done in 2002  . Obesity   . Smoking   . Varicose vein    Past Surgical History:  Procedure Laterality Date  . KNEE ARTHROSCOPY    . TUBAL LIGATION     The following portions of the patient's history were reviewed and updated as appropriate: allergies, current medications, past family history, past medical history, past social history, past surgical history and problem list.   Health Maintenance:  ASCUS pap and negative HRHPV on 10-24-2016.  Normal mammogram on 06-10-2015.   Review of Systems:  Pertinent items noted in HPI and remainder of comprehensive ROS otherwise negative.  Physical Exam:   General:  Alert, oriented and cooperative.   Mental Status: Normal mood and affect perceived. Normal judgment and thought content.  Physical exam deferred due to nature of the encounter  Labs and Imaging No results found for this or any previous visit (from the past 336 hour(s)). No results found.    Assessment and Plan:     1. Vaginal  discharge Rx: - azithromycin (ZITHROMAX) 500 MG tablet; Take 2 tablets (1,000 mg total) by mouth once for 1 dose.  Dispense: 2 tablet; Refill: 0 - fluconazole (DIFLUCAN) 150 MG tablet; Take 1 tablet (150 mg total) by mouth once for 1 dose.  Dispense: 1 tablet; Refill: 0 - doxycycline (VIBRAMYCIN) 100 MG capsule; Take 1 capsule (100 mg total) by mouth 2 (two) times daily. Avoid dairy products while taking Doxycycline.  Dispense: 14 capsule; Refill: 0 - metroNIDAZOLE (FLAGYL) 500 MG tablet; Take 1 tablet (500 mg total) by mouth 2 (two) times daily.  Dispense: 14 tablet; Refill: 0  2. Vaginal irritation and dryness - Coconut Oil recommended        I discussed the assessment and treatment plan with the patient. The patient was provided an opportunity to ask questions and all were answered. The patient agreed with the plan and demonstrated an understanding of the instructions.   The patient was advised to call back or seek an in-person evaluation/go to the ED if the symptoms worsen or if the condition fails to improve as anticipated.  I provided 15 minutes of non-face-to-face time during this encounter.   Baltazar Najjar, MD [facility] 08-15-2018

## 2018-12-03 ENCOUNTER — Telehealth: Payer: Self-pay | Admitting: Obstetrics

## 2018-12-03 ENCOUNTER — Other Ambulatory Visit: Payer: Self-pay | Admitting: Obstetrics

## 2018-12-03 DIAGNOSIS — B3731 Acute candidiasis of vulva and vagina: Secondary | ICD-10-CM

## 2018-12-03 DIAGNOSIS — B373 Candidiasis of vulva and vagina: Secondary | ICD-10-CM

## 2018-12-03 MED ORDER — CLOTRIMAZOLE 1 % EX CREA: 1.0000 "application " | TOPICAL_CREAM | Freq: Two times a day (BID) | CUTANEOUS | 2 refills | Status: AC

## 2018-12-03 MED ORDER — TERCONAZOLE 0.8 % VA CREA: 1.0000 | TOPICAL_CREAM | Freq: Every day | VAGINAL | 2 refills | Status: DC

## 2018-12-03 NOTE — Telephone Encounter (Signed)
April Mathis is calling requesting an Rx for Lotrimin and Terazol.  She is having some vaginal irritation and itching.   Routing to Dr. Jodi Mourning for approval.

## 2018-12-25 ENCOUNTER — Other Ambulatory Visit: Payer: Self-pay

## 2018-12-25 DIAGNOSIS — R300 Dysuria: Secondary | ICD-10-CM

## 2018-12-25 MED ORDER — PHENAZOPYRIDINE HCL 200 MG PO TABS: 200.0000 mg | ORAL_TABLET | Freq: Three times a day (TID) | ORAL | 0 refills | Status: AC | PRN

## 2018-12-25 MED ORDER — SULFAMETHOXAZOLE-TRIMETHOPRIM 800-160 MG PO TABS: 1.0000 | ORAL_TABLET | Freq: Two times a day (BID) | ORAL | 0 refills | Status: AC

## 2018-12-25 NOTE — Progress Notes (Signed)
Pt called and reports she is having symptoms of dysuria, reports it has been going on for a couple of days. I advised pt that I can send her medication for this per protocol and if she doesn't improve in the next 24-48 hours to call us back and we will need to see her in the office. Pt verbalizes understanding.

## 2018-12-27 ENCOUNTER — Telehealth: Payer: Self-pay

## 2018-12-27 ENCOUNTER — Other Ambulatory Visit: Payer: Self-pay

## 2018-12-27 MED ORDER — NITROFURANTOIN MONOHYD MACRO 100 MG PO CAPS: 100.0000 mg | ORAL_CAPSULE | Freq: Two times a day (BID) | ORAL | 1 refills | Status: DC

## 2018-12-27 NOTE — Telephone Encounter (Signed)
She can take Macrobid #14  Sig: 1 po bid

## 2018-12-27 NOTE — Telephone Encounter (Signed)
Pt called and reports that she cannot swallow the pills (Septra DS) that was prescribed to her for UTI per protocol. I advised pt she can cut the pills in half to make it easier for her to swallow, pt states she refuses to do that and only wants capsule form of medication. I advised pt that this is what is on our protocol to give for UTI and that any other medication will have to be authorized by a provider. Pt would like to know what she can take only in capsule form. Please advise.

## 2018-12-27 NOTE — Progress Notes (Signed)
Macrobid rx sent to patient's pharmacy per Dr. Jodi Mourning.

## 2018-12-27 NOTE — Telephone Encounter (Signed)
Rx Macrobid sent to Pt's pharmacy, pt made aware and voices understanding.

## 2019-02-08 ENCOUNTER — Other Ambulatory Visit: Payer: Self-pay | Admitting: Obstetrics

## 2019-02-08 DIAGNOSIS — N898 Other specified noninflammatory disorders of vagina: Secondary | ICD-10-CM

## 2019-02-08 DIAGNOSIS — B3731 Acute candidiasis of vulva and vagina: Secondary | ICD-10-CM

## 2019-02-08 DIAGNOSIS — B373 Candidiasis of vulva and vagina: Secondary | ICD-10-CM

## 2019-02-08 MED ORDER — TERCONAZOLE 0.8 % VA CREA: 1.0000 | TOPICAL_CREAM | Freq: Every day | VAGINAL | 4 refills | Status: DC

## 2019-02-08 MED ORDER — METRONIDAZOLE 500 MG PO TABS: 500.0000 mg | ORAL_TABLET | Freq: Two times a day (BID) | ORAL | 2 refills | Status: DC

## 2019-02-27 ENCOUNTER — Encounter (HOSPITAL_COMMUNITY): Payer: Self-pay

## 2019-02-27 ENCOUNTER — Ambulatory Visit (INDEPENDENT_AMBULATORY_CARE_PROVIDER_SITE_OTHER): Payer: Medicare Other

## 2019-02-27 ENCOUNTER — Ambulatory Visit (HOSPITAL_COMMUNITY)
Admission: EM | Admit: 2019-02-27 | Discharge: 2019-02-27 | Disposition: A | Discharge status: Home / Self Care | Payer: Medicare Other | Attending: Family Medicine | Admitting: Family Medicine

## 2019-02-27 ENCOUNTER — Other Ambulatory Visit: Payer: Self-pay

## 2019-02-27 DIAGNOSIS — S79921A Unspecified injury of right thigh, initial encounter: Secondary | ICD-10-CM

## 2019-02-27 DIAGNOSIS — T1490XA Injury, unspecified, initial encounter: Secondary | ICD-10-CM

## 2019-02-27 DIAGNOSIS — I10 Essential (primary) hypertension: Secondary | ICD-10-CM

## 2019-02-27 NOTE — Discharge Instructions (Addendum)
Your blood pressure was noted to be elevated during your visit today. You may return here within the next few days to recheck if unable to see your primary care doctor.   BP (!) 164/65 (BP Location: Left Arm)   Pulse 78   Temp 99.3 F (37.4 C) (Oral)   Resp 18   SpO2 100%
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
## 2019-03-04 ENCOUNTER — Telehealth: Payer: Self-pay

## 2019-03-04 NOTE — Telephone Encounter (Signed)
Pt wants to speak with Dr.Harper Regarding recent Rx sent.

## 2019-04-18 ENCOUNTER — Other Ambulatory Visit: Payer: Self-pay

## 2019-04-18 ENCOUNTER — Encounter: Payer: Self-pay | Admitting: Obstetrics

## 2019-04-18 ENCOUNTER — Other Ambulatory Visit (HOSPITAL_COMMUNITY)
Admission: RE | Admit: 2019-04-18 | Discharge: 2019-04-18 | Disposition: A | Discharge status: Home / Self Care | Payer: Medicare Other | Source: Ambulatory Visit | Attending: Obstetrics | Admitting: Obstetrics

## 2019-04-18 ENCOUNTER — Ambulatory Visit (INDEPENDENT_AMBULATORY_CARE_PROVIDER_SITE_OTHER): Payer: Medicare Other | Admitting: Obstetrics

## 2019-04-18 VITALS — BP 193/92 | HR 94 | Ht 62.0 in | Wt 204.4 lb

## 2019-04-18 DIAGNOSIS — Z780 Asymptomatic menopausal state: Secondary | ICD-10-CM | POA: Insufficient documentation

## 2019-04-18 DIAGNOSIS — Z01419 Encounter for gynecological examination (general) (routine) without abnormal findings: Secondary | ICD-10-CM | POA: Insufficient documentation

## 2019-04-18 DIAGNOSIS — Z113 Encounter for screening for infections with a predominantly sexual mode of transmission: Secondary | ICD-10-CM | POA: Diagnosis not present

## 2019-04-18 DIAGNOSIS — N898 Other specified noninflammatory disorders of vagina: Secondary | ICD-10-CM | POA: Insufficient documentation

## 2019-04-18 DIAGNOSIS — Z1239 Encounter for other screening for malignant neoplasm of breast: Secondary | ICD-10-CM

## 2019-04-18 DIAGNOSIS — Z1151 Encounter for screening for human papillomavirus (HPV): Secondary | ICD-10-CM | POA: Diagnosis not present

## 2019-04-18 DIAGNOSIS — R300 Dysuria: Secondary | ICD-10-CM

## 2019-04-18 MED ORDER — CEFUROXIME AXETIL 500 MG PO TABS: 500.0000 mg | ORAL_TABLET | Freq: Two times a day (BID) | ORAL | 0 refills | Status: AC

## 2019-04-18 NOTE — Progress Notes (Signed)
Patient presents for AEX and HIV testing.   Last pap 10/24/2016 ASCUS Last MM: 05/2015

## 2019-04-18 NOTE — Progress Notes (Addendum)
GYNECOLOGY OFFICE VISIT NOTE  History:   April Mathis is a 69 y.o. [phone] here today for an annual exam. Endorses lower back pain, and mild burning with urination. She denies any abnormal vaginal discharge, bleeding, pelvic pain or other concerns.    Past Medical History:  Diagnosis Date  . Bilateral leg edema   . DVT of lower extremity (deep venous thrombosis) ([place])   . History of recurrent UTIs   . Hypertension   . Knee pain   . Left knee DJD 2002   MRI done in 2002  . Obesity   . Smoking   . Varicose vein     Past Surgical History:  Procedure Laterality Date  . KNEE ARTHROSCOPY    . TUBAL LIGATION      The following portions of the patient's history were reviewed and updated as appropriate: allergies, current medications, past family history, past medical history, past social history, past surgical history and problem list.   Health Maintenance:  ASCUS pap and negative HRHPV in 2018.  Normal mammogram in 2017.   Review of Systems:  Pertinent items noted in HPI and remainder of comprehensive ROS otherwise negative.  Physical Exam:  BP (!) 193/92   Pulse 94   Ht 5\' 2"  (1.575 m)   Wt 204 lb 6.4 oz (92.7 kg)   BMI 37.39 kg/m  CONSTITUTIONAL: Well-developed, well-nourished female in no acute distress.  HEENT:  Normocephalic, atraumatic. External right and left ear normal. No scleral icterus.  NECK: Normal range of motion, supple, no masses noted on observation SKIN: No rash noted. Not diaphoretic. No erythema. No pallor. MUSCULOSKELETAL: Normal range of motion. No edema noted. NEUROLOGIC: Alert and oriented to person, place, and time. Normal muscle tone coordination. No cranial nerve deficit noted. PSYCHIATRIC: Normal mood and affect. Normal behavior. Normal judgment and thought content. CARDIOVASCULAR: Normal heart rate noted RESPIRATORY: Effort and breath sounds normal, no problems with respiration noted ABDOMEN: No masses noted. No other overt distention noted.    PELVIC: Normal appearing external genitalia; normal appearing vaginal mucosa and cervix. Moderate milky white discharge noted.  Normal uterine size, no other palpable masses, no uterine or adnexal tenderness. Performed in the presence of a chaperone  Labs and Imaging No results found for this or any previous visit (from the past 168 hour(s)). No results found.    Assessment and Plan:    1. Encounter for routine gynecological examination with Papanicolaou smear of cervix -Bimanual pelvic exam -Cytology - PAP( [facility])  2. Vaginal discharge - Cervicovaginal ancillary only  3. Screen for STD (sexually transmitted disease) - HIV Antibody (routine testing w rflx)  4. Screening breast examination -manual breast exam  - MM Digital Screening; Future  5. Back pain and burning with urination  -urinalysis and urine culture - Ceftin Rx  Routine preventative health maintenance measures emphasized. Please refer to After Visit Summary for other counseling recommendations.   Return in about 1 year (around 04/17/2020) for annual exam.    Total face-to-face time with patient: 30 minutes.  Over 50% of encounter was spent on counseling and coordination of care.  Attestation of Attending Supervision of Advanced Practice Provider (PA/CNM/NP): Evaluation and management procedures were performed by the Advanced Practice Provider under my supervision and collaboration.  I have reviewed the Advanced Practice Provider's note and chart, and I agree with the management and plan. I have also made any necessary editorial changes.   Shelly Bombard, MD, [place] Attending [place], Faculty  Practice Center for [employer], [facility] Baltazar Najjar, MD 3:32 PM

## 2019-04-19 LAB — CERVICOVAGINAL ANCILLARY ONLY
Bacterial Vaginitis (gardnerella): NEGATIVE
Candida Glabrata: NEGATIVE
Candida Vaginitis: NEGATIVE
Chlamydia: NEGATIVE
Comment: NEGATIVE
Comment: NEGATIVE
Comment: NEGATIVE
Comment: NEGATIVE
Comment: NEGATIVE
Comment: NORMAL
Neisseria Gonorrhea: NEGATIVE
Trichomonas: NEGATIVE

## 2019-04-19 LAB — HIV ANTIBODY (ROUTINE TESTING W REFLEX): HIV Screen 4th Generation wRfx: NONREACTIVE

## 2019-04-22 ENCOUNTER — Telehealth: Payer: Self-pay | Admitting: *Deleted

## 2019-04-22 LAB — CYTOLOGY - PAP
Comment: NEGATIVE
Diagnosis: NEGATIVE
High risk HPV: NEGATIVE

## 2019-04-22 NOTE — Telephone Encounter (Signed)
Pt called to office with questions about Antibiotic given for UTI. Pt made aware Rx was given to treat UTI and that is she has been taking Rx with no side effect she should not have to worry about diarrhea at this point. Pt was very concerned about having possible diarrhea with Rx use. Pt advised to continue Rx and drinks lots of water.  Pt states understanding.

## 2019-04-23 ENCOUNTER — Other Ambulatory Visit: Payer: Self-pay | Admitting: Obstetrics

## 2019-04-23 LAB — URINE CULTURE

## 2019-04-25 ENCOUNTER — Other Ambulatory Visit: Payer: Self-pay | Admitting: Obstetrics

## 2019-04-25 ENCOUNTER — Telehealth: Payer: Self-pay | Admitting: Obstetrics

## 2019-04-25 DIAGNOSIS — N393 Stress incontinence (female) (male): Secondary | ICD-10-CM

## 2019-04-25 NOTE — Telephone Encounter (Signed)
Returned call to patient regarding treatment of UTI with Ceftin and having a mild diarrhea.  She has taken the Ceftin for 6 days.  She was instructed to stop taking Ceftin, and that the amount that has been taken should have taken care of the UTI.  Shelly Bombard, MD 04/25/2019 2:27 PM

## 2019-05-09 ENCOUNTER — Ambulatory Visit: Payer: Medicare Other

## 2019-05-14 ENCOUNTER — Ambulatory Visit: Payer: Medicare Other

## 2019-05-16 ENCOUNTER — Other Ambulatory Visit: Payer: Self-pay

## 2019-05-16 ENCOUNTER — Other Ambulatory Visit: Payer: Self-pay | Admitting: Obstetrics

## 2019-05-16 MED ORDER — NITROFURANTOIN MONOHYD MACRO 100 MG PO CAPS: 100.0000 mg | ORAL_CAPSULE | Freq: Two times a day (BID) | ORAL | 0 refills | Status: AC

## 2019-05-16 NOTE — Progress Notes (Signed)
macro

## 2019-05-28 ENCOUNTER — Ambulatory Visit: Payer: Medicare Other

## 2019-06-03 ENCOUNTER — Ambulatory Visit (HOSPITAL_COMMUNITY)
Admission: EM | Admit: 2019-06-03 | Discharge: 2019-06-03 | Disposition: A | Discharge status: Home / Self Care | Payer: Medicare Other | Attending: Internal Medicine | Admitting: Internal Medicine

## 2019-06-03 ENCOUNTER — Other Ambulatory Visit: Payer: Self-pay

## 2019-06-03 ENCOUNTER — Encounter (HOSPITAL_COMMUNITY): Payer: Self-pay

## 2019-06-03 DIAGNOSIS — L089 Local infection of the skin and subcutaneous tissue, unspecified: Secondary | ICD-10-CM | POA: Diagnosis not present

## 2019-06-03 DIAGNOSIS — L723 Sebaceous cyst: Secondary | ICD-10-CM | POA: Diagnosis not present

## 2019-06-03 NOTE — ED Triage Notes (Addendum)
Pt is here with a abscess that started months ago. Pt has taken Advil 800mg  to relieve discomfort.

## 2019-06-03 NOTE — ED Provider Notes (Addendum)
April Mathis    CSN: [id number] Arrival date & time: 06/03/19  1518      History   Chief Complaint Chief Complaint  Patient presents with  . Abscess    HPI April Mathis is a 69 y.o. female comes to urgent care with painful swelling over the left upper chest.  Patient has had a small nontender swelling over the left upper chest for years.  Over the past month, is gotten bigger and more tender.  He comes in today because it red and the pain is worsening.  No discharge from the site.  No trauma.   HPI  Past Medical History:  Diagnosis Date  . Bilateral leg edema   . DVT of lower extremity (deep venous thrombosis) ([place])   . History of recurrent UTIs   . Hypertension   . Knee pain   . Left knee DJD 2002   MRI done in 2002  . Obesity   . Smoking   . Varicose vein     Patient Active Problem List   Diagnosis Date Noted  . Chronic pain of left knee 06/29/2016  . Cervical spine pain 05/26/2016  . Lipoma of right upper extremity 05/26/2016  . Spondylolysis, cervical region 12/03/2015  . Vaginitis 06/24/2013  . Stressful life event affecting family 02/20/2013  . DUB (dysfunctional uterine bleeding) 12/05/2011  . Varicose veins of lower extremities with other complications 0000000  . Preventative health care 08/11/2010  . PREDIABETES 08/17/2009  . Unilateral primary osteoarthritis, left knee 12/28/2005  . TOBACCO ABUSE 12/01/2005  . HYPERTENSION 12/01/2005    Past Surgical History:  Procedure Laterality Date  . KNEE ARTHROSCOPY    . TUBAL LIGATION      OB History    Gravida  6   Para  4   Term  4   Preterm  0   AB  2   Living  4     SAB  1   TAB  1   Ectopic  0   Multiple  0   Live Births  4            Home Medications    Prior to Admission medications   Medication Sig Start Date End Date Taking? Authorizing Provider  amLODipine (NORVASC) 5 MG tablet Take 5 mg by mouth daily. 06/21/18   [provider]  aspirin EC 81  MG tablet Take 81 mg by mouth daily.     [provider]  buPROPion (ZYBAN) 150 MG 12 hr tablet TAKE 1 TABLET BY MOUTH EVERY MORNING FOR 3 DAYS THEN TWICE A DAY 03/15/18   [provider]  cefUROXime (CEFTIN) 500 MG tablet Take 1 tablet (500 mg total) by mouth 2 (two) times daily with a meal. 04/18/19   Shelly Bombard, MD  clotrimazole (LOTRIMIN) 1 % cream Apply 1 application topically 2 (two) times daily. 12/03/18   Shelly Bombard, MD  cyclobenzaprine (FLEXERIL) 10 MG tablet Take 10 mg by mouth 3 (three) times daily as needed. 04/25/19   [provider]  cyclobenzaprine (FLEXERIL) 5 MG tablet Take 5 mg by mouth 3 (three) times daily as needed for pain.  01/16/16   [provider]  doxycycline (VIBRAMYCIN) 100 MG capsule Take 1 capsule (100 mg total) by mouth 2 (two) times daily. Avoid dairy products while taking Doxycycline. 08/15/18   Shelly Bombard, MD  lisinopril (PRINIVIL,ZESTRIL) 20 MG tablet Take 20 mg by mouth daily.    [provider]  nitrofurantoin, macrocrystal-monohydrate, (MACROBID) 100 MG capsule Take 1 capsule (100 mg total) by mouth 2 (two) times daily. 05/16/19   Shelly Bombard, MD  oxyCODONE (ROXICODONE) 15 MG immediate release tablet Take 15 mg by mouth every 6 (six) hours as needed. 03/29/19   [provider]  Cetirizine HCl (ZYRTEC ALLERGY) 10 MG CAPS Take 1 capsule (10 mg total) by mouth daily. Patient not taking: Reported on 08/15/2018 03/31/17 02/27/19  Glyn Ade, PA-C  famotidine (PEPCID) 40 MG tablet Take 1 tablet (40 mg total) by mouth daily. Patient not taking: Reported on 08/15/2018 03/31/17 02/27/19  Glyn Ade, PA-C    Family History Family History  Problem Relation Age of Onset  . Cancer Mother        LUNG  . Anesthesia problems Neg Hx     Social History Social History   Tobacco Use  . Smoking status: Current Every Day Smoker    Packs/day: 1.00    Years: 20.00    Pack years: 20.00     Types: Cigarettes  . Smokeless tobacco: Never Used  . Tobacco comment: 1/2 PPD  Substance Use Topics  . Alcohol use: Yes    Comment: "every now and then, not on a regular basis   . Drug use: No     Allergies   Hctz [hydrochlorothiazide], Penicillins, and Ceftin [cefuroxime]   Review of Systems Review of Systems  Constitutional: Negative for activity change, chills and fatigue.  Respiratory: Negative.   Musculoskeletal: Negative.   Skin: Negative.   Neurological: Negative.      Physical Exam Triage Vital Signs ED Triage Vitals  Enc Vitals Group     BP 06/03/19 1537 (!) 172/98     Pulse Rate 06/03/19 1537 75     Resp 06/03/19 1537 19     Temp 06/03/19 1537 98.2 F (36.8 C)     Temp Source 06/03/19 1537 Oral     SpO2 06/03/19 1537 100 %     Weight 06/03/19 1535 200 lb (90.7 kg)     Height --      Head Circumference --      Peak Flow --      Pain Score 06/03/19 1651 4     Pain Loc --      Pain Edu? --      Excl. in [place]? --    No data found.  Updated Vital Signs BP (!) 172/98 (BP Location: Right Arm)   Pulse 75   Temp 98.2 F (36.8 C) (Oral)   Resp 19   Wt 90.7 kg   SpO2 100%   BMI 36.58 kg/m   Visual Acuity Right Eye Distance:   Left Eye Distance:   Bilateral Distance:    Right Eye Near:   Left Eye Near:    Bilateral Near:     Physical Exam Vitals and nursing note reviewed.  Constitutional:      General: She is in acute distress.     Appearance: Normal appearance. She is not ill-appearing.  Cardiovascular:     Rate and Rhythm: Normal rate and regular rhythm.     Pulses: Normal pulses.     Heart sounds: Normal heart sounds.  Musculoskeletal:        General: No swelling, deformity or signs of injury. Normal range of motion.  Skin:    General: Skin is warm.     Capillary Refill: Capillary refill takes less than 2 seconds.     Coloration: Skin  is not jaundiced.     Findings: Lesion present. No erythema.  Neurological:     Mental Status: She is  alert.      UC Treatments / Results  Labs (all labs ordered are listed, but only abnormal results are displayed) Labs Reviewed - No data to display  EKG   Radiology No results found.  Procedures Procedures (including critical care time)  Medications Ordered in UC Medications - No data to display  Initial Impression / Assessment and Plan / UC Course  I have reviewed the triage vital signs and the nursing notes.  Pertinent labs & imaging results that were available during my care of the patient were reviewed by me and considered in my medical decision making (see chart for details).     1.  Infected sebaceous cyst: Over-the-counter pain medications General surgery referral given Patient will be evaluated by the general surgery team tomorrow at 2:30 PM Patient is aware of the appointment. Final Clinical Impressions(s) / UC Diagnoses   Final diagnoses:  Infected sebaceous cyst   Discharge Instructions   None    ED Prescriptions    None     PDMP not reviewed this encounter.   Chase Picket, MD 06/05/19 1358    Chase Picket, MD 06/05/19 1410

## 2019-06-12 ENCOUNTER — Telehealth: Payer: Self-pay | Admitting: *Deleted

## 2019-06-12 NOTE — Telephone Encounter (Signed)
Pt called to office asking for antibiotic after having a chest abscess drained.  Pt states it is no better and would like antibiotics for it.    Attempt to return call.   No answer. LVM making pt aware that our office cannot prescribe.  She will need to either call back to the Urgent Care or be seen there again for wound follow up.

## 2019-06-13 ENCOUNTER — Telehealth: Payer: Self-pay | Admitting: *Deleted

## 2019-06-13 NOTE — Telephone Encounter (Signed)
Spoke with pt regarding her request for antibiotic for abscess on chest.  Made aware she will have to call or be seen at Urgent care that did her procedure. Advised she may also try to be seen by her PCP if she would rather go there. Made pt aware she should be seen to have wound eval if she feels it is infected.

## 2019-07-05 ENCOUNTER — Other Ambulatory Visit: Payer: Self-pay

## 2019-07-05 DIAGNOSIS — N898 Other specified noninflammatory disorders of vagina: Secondary | ICD-10-CM

## 2019-07-05 MED ORDER — TERCONAZOLE 0.4 % VA CREA
1.0000 | TOPICAL_CREAM | Freq: Every day | VAGINAL | 0 refills | Status: DC
Start: 2019-07-05 — End: 2021-07-29

## 2019-07-05 NOTE — Progress Notes (Signed)
Call from pt regarding vaginal itching requesting Rx cream for yeast .  Rx sent per protocol for yeast  Called pt to make aware no answer No vm picked up

## 2019-07-15 ENCOUNTER — Encounter: Payer: Self-pay | Admitting: Obstetrics

## 2019-07-15 ENCOUNTER — Ambulatory Visit (INDEPENDENT_AMBULATORY_CARE_PROVIDER_SITE_OTHER): Payer: Medicare Other | Admitting: Obstetrics

## 2019-07-15 ENCOUNTER — Other Ambulatory Visit: Payer: Self-pay

## 2019-07-15 ENCOUNTER — Other Ambulatory Visit (HOSPITAL_COMMUNITY)
Admission: RE | Admit: 2019-07-15 | Discharge: 2019-07-15 | Disposition: A | Discharge status: Home / Self Care | Payer: Medicare Other | Source: Ambulatory Visit | Attending: Obstetrics | Admitting: Obstetrics

## 2019-07-15 VITALS — BP 169/74 | HR 77 | Wt 205.9 lb

## 2019-07-15 DIAGNOSIS — N898 Other specified noninflammatory disorders of vagina: Secondary | ICD-10-CM | POA: Diagnosis present

## 2019-07-15 DIAGNOSIS — K121 Other forms of stomatitis: Secondary | ICD-10-CM | POA: Diagnosis not present

## 2019-07-15 DIAGNOSIS — Z113 Encounter for screening for infections with a predominantly sexual mode of transmission: Secondary | ICD-10-CM

## 2019-07-15 NOTE — Progress Notes (Signed)
Patient ID: April Mathis, female   DOB: November 11, 1950, 69 y.o.   MRN: [id number]  Chief Complaint  Patient presents with  . Vaginal Discharge    HPI April Mathis is a 69 y.o. female.  Vaginal discharge and itching.  Also being treated by PCP for " bumps " in mouth. HPI  Past Medical History:  Diagnosis Date  . Bilateral leg edema   . DVT of lower extremity (deep venous thrombosis) ([place])   . History of recurrent UTIs   . Hypertension   . Knee pain   . Left knee DJD 2002   MRI done in 2002  . Obesity   . Smoking   . Varicose vein     Past Surgical History:  Procedure Laterality Date  . KNEE ARTHROSCOPY    . TUBAL LIGATION      Family History  Problem Relation Age of Onset  . Cancer Mother        LUNG  . Anesthesia problems Neg Hx     Social History Social History   Tobacco Use  . Smoking status: Current Every Day Smoker    Packs/day: 1.00    Years: 20.00    Pack years: 20.00    Types: Cigarettes  . Smokeless tobacco: Never Used  . Tobacco comment: 1/2 PPD  Vaping Use  . Vaping Use: Never used  Substance Use Topics  . Alcohol use: Yes    Comment: "every now and then, not on a regular basis   . Drug use: No    Allergies  Allergen Reactions  . Hctz [Hydrochlorothiazide] Other (See Comments)    Reaction:  Drops pts BP  . Penicillins Hives and Other (See Comments)    Has patient had a PCN reaction causing immediate rash, facial/tongue/throat swelling, SOB or lightheadedness with hypotension: No Has patient had a PCN reaction causing severe rash involving mucus membranes or skin necrosis: No Has patient had a PCN reaction that required hospitalization No Has patient had a PCN reaction occurring within the last 10 years: No If all of the above answers are "NO", then may proceed with Cephalosporin use.  Marland Kitchen Ceftin [Cefuroxime] Diarrhea    Current Outpatient Medications  Medication Sig Dispense Refill  . amLODipine (NORVASC) 5 MG tablet Take 5 mg by mouth  daily.    Marland Kitchen aspirin EC 81 MG tablet Take 81 mg by mouth daily.     Marland Kitchen buPROPion (ZYBAN) 150 MG 12 hr tablet TAKE 1 TABLET BY MOUTH EVERY MORNING FOR 3 DAYS THEN TWICE A DAY    . cefUROXime (CEFTIN) 500 MG tablet Take 1 tablet (500 mg total) by mouth 2 (two) times daily with a meal. 14 tablet 0  . clotrimazole (LOTRIMIN) 1 % cream Apply 1 application topically 2 (two) times daily. 60 g 2  . cyclobenzaprine (FLEXERIL) 10 MG tablet Take 10 mg by mouth 3 (three) times daily as needed.    . cyclobenzaprine (FLEXERIL) 5 MG tablet Take 5 mg by mouth 3 (three) times daily as needed for pain.     Marland Kitchen doxycycline (VIBRAMYCIN) 100 MG capsule Take 1 capsule (100 mg total) by mouth 2 (two) times daily. Avoid dairy products while taking Doxycycline. 14 capsule 0  . lisinopril (PRINIVIL,ZESTRIL) 20 MG tablet Take 20 mg by mouth daily.    . nitrofurantoin, macrocrystal-monohydrate, (MACROBID) 100 MG capsule Take 1 capsule (100 mg total) by mouth 2 (two) times daily. 14 capsule 0  . oxyCODONE (ROXICODONE) 15 MG immediate release tablet  Take 15 mg by mouth every 6 (six) hours as needed.    Marland Kitchen terconazole (TERAZOL 7) 0.4 % vaginal cream Place 1 applicator vaginally at bedtime. 45 g 0   No current facility-administered medications for this visit.    Review of Systems Review of Systems Constitutional: negative for fatigue and weight loss HEENT: positive for " bumps " in mouth that are sore Respiratory: negative for cough and wheezing Cardiovascular: negative for chest pain, fatigue and palpitations Gastrointestinal: negative for abdominal pain and change in bowel habits Genitourinary:positive for vaginal discharge with itching Integument/breast: negative for nipple discharge Musculoskeletal:negative for myalgias Neurological: negative for gait problems and tremors Behavioral/Psych: negative for abusive relationship, depression Endocrine: negative for temperature intolerance      Blood pressure (!) 169/74, pulse  77, weight 205 lb 14.4 oz (93.4 kg).  Physical Exam Physical Exam General:   alert and no distress  Skin:   no rash or abnormalities  Lungs:   clear to auscultation bilaterally  Heart:   regular rate and rhythm, S1, S2 normal, no murmur, click, rub or gallop  Breasts:   normal without suspicious masses, skin or nipple changes or axillary nodes  Abdomen:  normal findings: no organomegaly, soft, non-tender and no hernia  Pelvis:  External genitalia: normal general appearance Urinary system: urethral meatus normal and bladder without fullness, nontender Vaginal: normal without tenderness, induration or masses Cervix: normal appearance Adnexa: normal bimanual exam Uterus: anteverted and non-tender, normal size    50% of 20 min visit spent on counseling and coordination of care.   Data Reviewed Wet Prep and cultures  Assessment     .   1. Vaginal discharge Rx: - Cervicovaginal ancillary only( [place])  2. Oral ulceration Rx: - Herpes simplex virus culture  3. Screen for STD (sexually transmitted disease) Rx: - HIV Antibody (routine testing w rflx) - Hepatitis B surface antigen - RPR - Hepatitis C antibody  Plan  Follow up prn   Orders Placed This Encounter  Procedures  . Herpes simplex virus culture  . HIV Antibody (routine testing w rflx)  . Hepatitis B surface antigen  . RPR  . Hepatitis C antibody      Shelly Bombard, MD 07/15/2019 9:59 AM

## 2019-07-15 NOTE — Progress Notes (Signed)
GYN presents for Vaginal discharge, itching, burning, odor x 1 week. C/o abscess, bumps on inside of mouth and wants swab.

## 2019-07-16 ENCOUNTER — Other Ambulatory Visit: Payer: Self-pay | Admitting: Obstetrics

## 2019-07-16 DIAGNOSIS — N760 Acute vaginitis: Secondary | ICD-10-CM

## 2019-07-16 DIAGNOSIS — A599 Trichomoniasis, unspecified: Secondary | ICD-10-CM

## 2019-07-16 LAB — CERVICOVAGINAL ANCILLARY ONLY
Bacterial Vaginitis (gardnerella): POSITIVE — AB
Candida Glabrata: NEGATIVE
Candida Vaginitis: NEGATIVE
Chlamydia: NEGATIVE
Comment: NEGATIVE
Comment: NEGATIVE
Comment: NEGATIVE
Comment: NEGATIVE
Comment: NEGATIVE
Comment: NORMAL
Neisseria Gonorrhea: NEGATIVE
Trichomonas: POSITIVE — AB

## 2019-07-16 LAB — HEPATITIS C ANTIBODY: Hep C Virus Ab: <0.1 s/co ratio (ref 0.0–0.9)

## 2019-07-16 LAB — HIV ANTIBODY (ROUTINE TESTING W REFLEX): HIV Screen 4th Generation wRfx: NONREACTIVE

## 2019-07-16 LAB — HEPATITIS B SURFACE ANTIGEN: Hepatitis B Surface Ag: NEGATIVE

## 2019-07-16 LAB — RPR: RPR Ser Ql: NONREACTIVE

## 2019-07-16 MED ORDER — METRONIDAZOLE 500 MG PO TABS: 500.0000 mg | ORAL_TABLET | Freq: Two times a day (BID) | ORAL | 2 refills | Status: DC

## 2019-07-18 LAB — HERPES SIMPLEX VIRUS CULTURE

## 2019-07-19 ENCOUNTER — Encounter: Payer: Self-pay | Admitting: Obstetrics

## 2019-07-19 ENCOUNTER — Telehealth (INDEPENDENT_AMBULATORY_CARE_PROVIDER_SITE_OTHER): Payer: Medicare Other | Admitting: Obstetrics

## 2019-07-19 DIAGNOSIS — A599 Trichomoniasis, unspecified: Secondary | ICD-10-CM | POA: Diagnosis not present

## 2019-07-19 MED ORDER — TINIDAZOLE 500 MG PO TABS: 2.0000 g | ORAL_TABLET | Freq: Once | ORAL | 0 refills | Status: AC

## 2019-07-19 NOTE — Telephone Encounter (Signed)
Patient has appointment with DR. MARSHAL ON OCTOBER 22,2014 @2 :45.patient contacted and given information.

## 2019-07-19 NOTE — Progress Notes (Signed)
TELEHEALTH GYNECOLOGY VISIT ENCOUNTER NOTE  I connected with April Mathis on 07/19/19 at 11:15 AM EDT by telephone at home and verified that I am speaking with the correct person using two identifiers.   I discussed the limitations, risks, security and privacy concerns of performing an evaluation and management service by telephone and the availability of in person appointments. I also discussed with the patient that there may be a patient responsible charge related to this service. The patient expressed understanding and agreed to proceed.   History:  April Mathis is a 69 y.o. [phone] female being evaluated today for medication management.  She states that she can't take Flagyl because of GI side effects.  She denies any abnormal vaginal discharge, bleeding, pelvic pain or other concerns.       Past Medical History:  Diagnosis Date  . Bilateral leg edema   . DVT of lower extremity (deep venous thrombosis) ([place])   . History of recurrent UTIs   . Hypertension   . Knee pain   . Left knee DJD 2002   MRI done in 2002  . Obesity   . Smoking   . Varicose vein    Past Surgical History:  Procedure Laterality Date  . KNEE ARTHROSCOPY    . TUBAL LIGATION     The following portions of the patient's history were reviewed and updated as appropriate: allergies, current medications, past family history, past medical history, past social history, past surgical history and problem list.  Review of Systems:  Pertinent items noted in HPI and remainder of comprehensive ROS otherwise negative.  Physical Exam:   General:  Alert, oriented and cooperative.   Mental Status: Normal mood and affect perceived. Normal judgment and thought content.  Physical exam deferred due to nature of the encounter  Labs and Imaging Results for orders placed or performed in visit on 07/15/19 (from the past 336 hour(s))  Cervicovaginal ancillary only( [place])   Collection Time: 07/15/19  9:37 AM  Result  Value Ref Range   Neisseria Gonorrhea Negative    Chlamydia Negative    Trichomonas Positive (A)    Bacterial Vaginitis (gardnerella) Positive (A)    Candida Vaginitis Negative    Candida Glabrata Negative    Comment Normal Reference Range Candida Species - Negative    Comment Normal Reference Range Candida Galbrata - Negative    Comment Normal Reference Range Trichomonas - Negative    Comment      Normal Reference Range Bacterial Vaginosis - Negative   Comment Normal Reference [place] Chlamydia - Negative    Comment      Normal Reference Range Neisseria Gonorrhea - Negative  HIV Antibody (routine testing w rflx)   Collection Time: 07/15/19 10:11 AM  Result Value Ref Range   HIV Screen 4th Generation wRfx Non Reactive Non Reactive  Hepatitis B surface antigen   Collection Time: 07/15/19 10:11 AM  Result Value Ref Range   Hepatitis B Surface Ag Negative Negative  RPR   Collection Time: 07/15/19 10:11 AM  Result Value Ref Range   RPR Ser Ql Non Reactive Non Reactive  Hepatitis C antibody   Collection Time: 07/15/19 10:11 AM  Result Value Ref Range   Hep C Virus Ab <0.1 0.0 - 0.9 s/co ratio  Herpes simplex virus culture   Collection Time: 07/15/19 10:14 AM   Specimen: Blood   VR  Result Value Ref Range   HSV Culture/Type Comment    No results found.  Assessment and Plan:     1. Trichomonas infection Rx: - tinidazole (TINDAMAX) 500 MG tablet; Take 4 tablets (2,000 mg total) by mouth once for 1 dose.  Dispense: 4 tablet; Refill: 0       I discussed the assessment and treatment plan with the patient. The patient was provided an opportunity to ask questions and all were answered. The patient agreed with the plan and demonstrated an understanding of the instructions.   The patient was advised to call back or seek an in-person evaluation/go to the ED if the symptoms worsen or if the condition fails to improve as anticipated.  I provided 15 minutes of non-face-to-face time  during this encounter.   Baltazar Najjar, MD Center for [facility], [facility] [facility] 07/19/19

## 2019-07-31 ENCOUNTER — Other Ambulatory Visit (HOSPITAL_COMMUNITY)
Admission: RE | Admit: 2019-07-31 | Discharge: 2019-07-31 | Disposition: A | Discharge status: Home / Self Care | Payer: Medicare Other | Source: Ambulatory Visit | Attending: Obstetrics | Admitting: Obstetrics

## 2019-07-31 ENCOUNTER — Encounter: Payer: Self-pay | Admitting: Obstetrics

## 2019-07-31 ENCOUNTER — Other Ambulatory Visit: Payer: Self-pay

## 2019-07-31 ENCOUNTER — Ambulatory Visit (INDEPENDENT_AMBULATORY_CARE_PROVIDER_SITE_OTHER): Payer: Medicare Other | Admitting: Obstetrics

## 2019-07-31 VITALS — BP 172/78 | HR 71 | Wt 205.0 lb

## 2019-07-31 DIAGNOSIS — N898 Other specified noninflammatory disorders of vagina: Secondary | ICD-10-CM | POA: Diagnosis not present

## 2019-07-31 DIAGNOSIS — B369 Superficial mycosis, unspecified: Secondary | ICD-10-CM | POA: Diagnosis present

## 2019-07-31 MED ORDER — CLOTRIMAZOLE 1 % EX CREA: 1.0000 "application " | TOPICAL_CREAM | Freq: Two times a day (BID) | CUTANEOUS | 1 refills | Status: AC

## 2019-07-31 NOTE — Progress Notes (Signed)
Patient ID: April Mathis, female   DOB: 20-Mar-1950, 69 y.o.   MRN: [id number]  Chief Complaint  Patient presents with  . GYN    HPI April Mathis is a 69 y.o. female.  Complains of brownish discharge and itching in inguinal area and around anus. HPI  Past Medical History:  Diagnosis Date  . Bilateral leg edema   . DVT of lower extremity (deep venous thrombosis) ([place])   . History of recurrent UTIs   . Hypertension   . Knee pain   . Left knee DJD 2002   MRI done in 2002  . Obesity   . Smoking   . Varicose vein     Past Surgical History:  Procedure Laterality Date  . KNEE ARTHROSCOPY    . TUBAL LIGATION      Family History  Problem Relation Age of Onset  . Cancer Mother        LUNG  . Anesthesia problems Neg Hx     Social History Social History   Tobacco Use  . Smoking status: Current Every Day Smoker    Packs/day: 1.00    Years: 20.00    Pack years: 20.00    Types: Cigarettes  . Smokeless tobacco: Never Used  . Tobacco comment: 1/2 PPD  Vaping Use  . Vaping Use: Never used  Substance Use Topics  . Alcohol use: Yes    Comment: "every now and then, not on a regular basis   . Drug use: No    Allergies  Allergen Reactions  . Hctz [Hydrochlorothiazide] Other (See Comments)    Reaction:  Drops pts BP  . Penicillins Hives and Other (See Comments)    Has patient had a PCN reaction causing immediate rash, facial/tongue/throat swelling, SOB or lightheadedness with hypotension: No Has patient had a PCN reaction causing severe rash involving mucus membranes or skin necrosis: No Has patient had a PCN reaction that required hospitalization No Has patient had a PCN reaction occurring within the last 10 years: No If all of the above answers are "NO", then may proceed with Cephalosporin use.  Marland Kitchen Ceftin [Cefuroxime] Diarrhea    Current Outpatient Medications  Medication Sig Dispense Refill  . amLODipine (NORVASC) 5 MG tablet Take 5 mg by mouth daily.    Marland Kitchen aspirin EC  81 MG tablet Take 81 mg by mouth daily.     Marland Kitchen buPROPion (ZYBAN) 150 MG 12 hr tablet TAKE 1 TABLET BY MOUTH EVERY MORNING FOR 3 DAYS THEN TWICE A DAY    . cefUROXime (CEFTIN) 500 MG tablet Take 1 tablet (500 mg total) by mouth 2 (two) times daily with a meal. 14 tablet 0  . clotrimazole (LOTRIMIN) 1 % cream Apply 1 application topically 2 (two) times daily. 60 g 2  . clotrimazole (LOTRIMIN) 1 % cream Apply 1 application topically 2 (two) times daily. 113 g 1  . cyclobenzaprine (FLEXERIL) 10 MG tablet Take 10 mg by mouth 3 (three) times daily as needed.    . cyclobenzaprine (FLEXERIL) 5 MG tablet Take 5 mg by mouth 3 (three) times daily as needed for pain.     Marland Kitchen doxycycline (VIBRAMYCIN) 100 MG capsule Take 1 capsule (100 mg total) by mouth 2 (two) times daily. Avoid dairy products while taking Doxycycline. 14 capsule 0  . lisinopril (PRINIVIL,ZESTRIL) 20 MG tablet Take 20 mg by mouth daily.    . metroNIDAZOLE (FLAGYL) 500 MG tablet Take 1 tablet (500 mg total) by mouth 2 (two) times daily.  14 tablet 2  . nitrofurantoin, macrocrystal-monohydrate, (MACROBID) 100 MG capsule Take 1 capsule (100 mg total) by mouth 2 (two) times daily. 14 capsule 0  . oxyCODONE (ROXICODONE) 15 MG immediate release tablet Take 15 mg by mouth every 6 (six) hours as needed.    Marland Kitchen terconazole (TERAZOL 7) 0.4 % vaginal cream Place 1 applicator vaginally at bedtime. 45 g 0   No current facility-administered medications for this visit.    Review of Systems Review of Systems Constitutional: negative for fatigue and weight loss Respiratory: negative for cough and wheezing Cardiovascular: negative for chest pain, fatigue and palpitations Gastrointestinal: negative for abdominal pain and change in bowel habits Genitourinary:positive for jock-itch  Integument/breast: negative for nipple discharge Musculoskeletal:negative for myalgias Neurological: negative for gait problems and tremors Behavioral/Psych: negative for abusive  relationship, depression Endocrine: negative for temperature intolerance      Blood pressure (!) 172/78, pulse 71, weight 205 lb (93 kg).  Physical Exam Physical Exam           General: Alert and no distress Abdomen:  normal findings: no organomegaly, soft, non-tender and no hernia  Pelvis:  External genitalia: fungal appearance of lateral vulva, inguinal and para-rectal areas Urinary system: urethral meatus normal and bladder without fullness, nontender Vaginal: normal without tenderness, induration or masses Cervix: normal appearance Adnexa: normal bimanual exam Uterus: anteverted and non-tender, normal size    50% of 15 min visit spent on counseling and coordination of care.   Data Reviewed Wet Prep Cultures  Assessment     1. Vaginal discharge Rx: - Cervicovaginal ancillary only( [place])  2. 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  Dispense: 113 g; Refill: 1  Plan  Follow up prn   Meds ordered this encounter  Medications  . clotrimazole (LOTRIMIN) 1 % cream    Sig: Apply 1 application topically 2 (two) times daily.    Dispense:  113 g    Refill:  1     Shelly Bombard, MD 07/31/2019 11:34 AM

## 2019-07-31 NOTE — Progress Notes (Signed)
GYN patient is in the office, reporting yeast and anal itching

## 2019-08-01 LAB — CERVICOVAGINAL ANCILLARY ONLY
Bacterial Vaginitis (gardnerella): POSITIVE — AB
Candida Glabrata: NEGATIVE
Candida Vaginitis: NEGATIVE
Comment: NEGATIVE
Comment: NEGATIVE
Comment: NEGATIVE

## 2019-08-02 ENCOUNTER — Other Ambulatory Visit: Payer: Self-pay | Admitting: Obstetrics

## 2019-08-02 DIAGNOSIS — N760 Acute vaginitis: Secondary | ICD-10-CM

## 2019-08-02 MED ORDER — METRONIDAZOLE 0.75 % VA GEL: 1.0000 | Freq: Two times a day (BID) | VAGINAL | 5 refills | Status: DC

## 2019-08-05 ENCOUNTER — Encounter: Payer: Self-pay | Admitting: Obstetrics

## 2019-08-05 ENCOUNTER — Telehealth (INDEPENDENT_AMBULATORY_CARE_PROVIDER_SITE_OTHER): Payer: Medicare Other | Admitting: Obstetrics

## 2019-08-05 DIAGNOSIS — A599 Trichomoniasis, unspecified: Secondary | ICD-10-CM

## 2019-08-05 DIAGNOSIS — B9689 Other specified bacterial agents as the cause of diseases classified elsewhere: Secondary | ICD-10-CM

## 2019-08-05 DIAGNOSIS — N760 Acute vaginitis: Secondary | ICD-10-CM | POA: Diagnosis not present

## 2019-08-05 DIAGNOSIS — Z1239 Encounter for other screening for malignant neoplasm of breast: Secondary | ICD-10-CM

## 2019-08-05 DIAGNOSIS — K121 Other forms of stomatitis: Secondary | ICD-10-CM

## 2019-08-05 NOTE — Progress Notes (Signed)
   TELEHEALTH GYNECOLOGY VISIT ENCOUNTER NOTE  I connected with April Mathis on 08/05/19 at  2:30 PM EDT by telephone at home and verified that I am speaking with the correct person using two identifiers.   I discussed the limitations, risks, security and privacy concerns of performing an evaluation and management service by telephone and the availability of in person appointments. I also discussed with the patient that there may be a patient responsible charge related to this service. The patient expressed understanding and agreed to proceed.   History:  April Mathis is a 69 y.o. [phone] female being evaluated today for follow up results from trichomonas and BV infections; and results of HSV testing for an oral ulceration.  She states that she took all of the medication that was prescribed. She denies any abnormal vaginal discharge, bleeding, pelvic pain or other concerns.       Past Medical History:  Diagnosis Date  . Bilateral leg edema   . DVT of lower extremity (deep venous thrombosis) ([place])   . History of recurrent UTIs   . Hypertension   . Knee pain   . Left knee DJD 2002   MRI done in 2002  . Obesity   . Smoking   . Varicose vein    Past Surgical History:  Procedure Laterality Date  . KNEE ARTHROSCOPY    . TUBAL LIGATION     The following portions of the patient's history were reviewed and updated as appropriate: allergies, current medications, past family history, past medical history, past social history, past surgical history and problem list.   Health Maintenance:  Normal pap and negative HRHPV on 04-18-2019.  Normal mammogram on 06-10-2015.   Review of Systems:  Pertinent items noted in HPI and remainder of comprehensive ROS otherwise negative.  Physical Exam:   General:  Alert, oriented and cooperative.   Mental Status: Normal mood and affect perceived. Normal judgment and thought content.  Physical exam deferred due to nature of the encounter  Labs and  Imaging Results for orders placed or performed in visit on 07/31/19 (from the past 336 hour(s))  Cervicovaginal ancillary only( [place])   Collection Time: 07/31/19 11:24 AM  Result Value Ref Range   Candida Vaginitis Negative    Candida Glabrata Negative    Bacterial Vaginitis (gardnerella) Positive (A)    Comment Normal Reference Range Candida Species - Negative    Comment Normal Reference Range Candida Galbrata - Negative    Comment      Normal Reference Range Bacterial Vaginosis - Negative   No results found.    Assessment and Plan:     1. Trichomonas infection, treated  2. BV (bacterial vaginosis), treated  3. Oral ulceration - negative HSV culture - will follow up with her dentist  4. Screening breast examination Rx: - MM Digital Screening; Future      I discussed the assessment and treatment plan with the patient. The patient was provided an opportunity to ask questions and all were answered. The patient agreed with the plan and demonstrated an understanding of the instructions.   The patient was advised to call back or seek an in-person evaluation/go to the ED if the symptoms worsen or if the condition fails to improve as anticipated.  I provided 15 minutes of non-face-to-face time during this encounter.   Baltazar Najjar, MD Center for [facility], [place] [facility] 08/05/19

## 2019-08-08 ENCOUNTER — Other Ambulatory Visit: Payer: Self-pay

## 2019-08-08 ENCOUNTER — Emergency Department (HOSPITAL_COMMUNITY)
Admission: EM | Admit: 2019-08-08 | Discharge: 2019-08-08 | Disposition: A | Discharge status: Home / Self Care | Payer: Medicare Other | Attending: Emergency Medicine | Admitting: Emergency Medicine

## 2019-08-08 ENCOUNTER — Encounter (HOSPITAL_COMMUNITY): Payer: Self-pay | Admitting: Emergency Medicine

## 2019-08-08 ENCOUNTER — Emergency Department (HOSPITAL_COMMUNITY)
Admission: EM | Admit: 2019-08-08 | Discharge: 2019-08-08 | Disposition: A | Discharge status: Home / Self Care | Payer: Medicare Other | Source: Home / Self Care

## 2019-08-08 DIAGNOSIS — Y939 Activity, unspecified: Secondary | ICD-10-CM | POA: Insufficient documentation

## 2019-08-08 DIAGNOSIS — Y929 Unspecified place or not applicable: Secondary | ICD-10-CM | POA: Insufficient documentation

## 2019-08-08 DIAGNOSIS — X58XXXA Exposure to other specified factors, initial encounter: Secondary | ICD-10-CM | POA: Insufficient documentation

## 2019-08-08 DIAGNOSIS — T192XXA Foreign body in vulva and vagina, initial encounter: Secondary | ICD-10-CM | POA: Insufficient documentation

## 2019-08-08 DIAGNOSIS — Y999 Unspecified external cause status: Secondary | ICD-10-CM | POA: Insufficient documentation

## 2019-08-08 DIAGNOSIS — Z5321 Procedure and treatment not carried out due to patient leaving prior to being seen by health care provider: Secondary | ICD-10-CM | POA: Insufficient documentation

## 2019-08-08 LAB — URINALYSIS, ROUTINE W REFLEX MICROSCOPIC
Bacteria, UA: NONE SEEN
Bilirubin Urine: NEGATIVE
Glucose, UA: NEGATIVE mg/dL
Ketones, ur: NEGATIVE mg/dL
Leukocytes,Ua: NEGATIVE
Nitrite: NEGATIVE
Protein, ur: NEGATIVE mg/dL
Specific Gravity, Urine: 1.015 (ref 1.005–1.030)
pH: 6 (ref 5.0–8.0)

## 2019-08-08 NOTE — ED Triage Notes (Signed)
Patient arrived with PTAR reports multiple cockroaches found by patient in her vagina this evening , denies pain or dysuria , no fever or chills .

## 2019-08-08 NOTE — ED Notes (Signed)
Upon attempting to get patient triaged, pt refused temperature stating "I dont have no damn temperature!" pt also refused to changed into burgundy scrubs. She stated "If this is how it is, im leaving."

## 2019-08-08 NOTE — ED Triage Notes (Signed)
Pt arrives via [facility] from home with c/o that she thinks a roach is in her vagina. Pt also endorsed that she is suicidal due to this. Pt seen here for same last night.

## 2019-08-08 NOTE — ED Notes (Signed)
Pt seen leaving ED

## 2019-08-09 ENCOUNTER — Ambulatory Visit (INDEPENDENT_AMBULATORY_CARE_PROVIDER_SITE_OTHER): Payer: Medicare Other | Admitting: Obstetrics

## 2019-08-09 ENCOUNTER — Encounter: Payer: Self-pay | Admitting: Obstetrics

## 2019-08-09 VITALS — BP 172/78 | HR 73 | Wt 204.0 lb

## 2019-08-09 DIAGNOSIS — R238 Other skin changes: Secondary | ICD-10-CM

## 2019-08-09 DIAGNOSIS — T192XXS Foreign body in vulva and vagina, sequela: Secondary | ICD-10-CM

## 2019-08-09 DIAGNOSIS — T192XXD Foreign body in vulva and vagina, subsequent encounter: Secondary | ICD-10-CM | POA: Diagnosis not present

## 2019-08-09 MED ORDER — SELENIUM SULFIDE 2.5 % EX LOTN: 1.0000 "application " | TOPICAL_LOTION | Freq: Every day | CUTANEOUS | 2 refills | Status: DC | PRN

## 2019-08-09 NOTE — Progress Notes (Signed)
Patient ID: April Mathis, female   DOB: April 10, 1950, 69 y.o.   MRN: [id number]   HPI April Mathis is a 69 y.o. female.  Complains of roach found in vagina. HPI  Past Medical History:  Diagnosis Date  . Bilateral leg edema   . DVT of lower extremity (deep venous thrombosis) ([place])   . History of recurrent UTIs   . Hypertension   . Knee pain   . Left knee DJD 2002   MRI done in 2002  . Obesity   . Smoking   . Varicose vein     Past Surgical History:  Procedure Laterality Date  . KNEE ARTHROSCOPY    . TUBAL LIGATION      Family History  Problem Relation Age of Onset  . Cancer Mother        LUNG  . Anesthesia problems Neg Hx     Social History Social History   Tobacco Use  . Smoking status: Current Every Day Smoker    Packs/day: 1.00    Years: 20.00    Pack years: 20.00    Types: Cigarettes  . Smokeless tobacco: Never Used  . Tobacco comment: 1/2 PPD  Vaping Use  . Vaping Use: Never used  Substance Use Topics  . Alcohol use: Yes    Comment: "every now and then, not on a regular basis   . Drug use: No    Allergies  Allergen Reactions  . Hctz [Hydrochlorothiazide] Other (See Comments)    Reaction:  Drops pts BP  . Penicillins Hives and Other (See Comments)    Has patient had a PCN reaction causing immediate rash, facial/tongue/throat swelling, SOB or lightheadedness with hypotension: No Has patient had a PCN reaction causing severe rash involving mucus membranes or skin necrosis: No Has patient had a PCN reaction that required hospitalization No Has patient had a PCN reaction occurring within the last 10 years: No If all of the above answers are "NO", then may proceed with Cephalosporin use.  Marland Kitchen Ceftin [Cefuroxime] Diarrhea    Current Outpatient Medications  Medication Sig Dispense Refill  . amLODipine (NORVASC) 5 MG tablet Take 5 mg by mouth daily.    Marland Kitchen aspirin EC 81 MG tablet Take 81 mg by mouth daily.     Marland Kitchen buPROPion (ZYBAN) 150 MG 12 hr tablet TAKE 1  TABLET BY MOUTH EVERY MORNING FOR 3 DAYS THEN TWICE A DAY    . cefUROXime (CEFTIN) 500 MG tablet Take 1 tablet (500 mg total) by mouth 2 (two) times daily with a meal. 14 tablet 0  . clotrimazole (LOTRIMIN) 1 % cream Apply 1 application topically 2 (two) times daily. 60 g 2  . clotrimazole (LOTRIMIN) 1 % cream Apply 1 application topically 2 (two) times daily. 113 g 1  . cyclobenzaprine (FLEXERIL) 10 MG tablet Take 10 mg by mouth 3 (three) times daily as needed.    . cyclobenzaprine (FLEXERIL) 5 MG tablet Take 5 mg by mouth 3 (three) times daily as needed for pain.     Marland Kitchen doxycycline (VIBRAMYCIN) 100 MG capsule Take 1 capsule (100 mg total) by mouth 2 (two) times daily. Avoid dairy products while taking Doxycycline. 14 capsule 0  . lisinopril (PRINIVIL,ZESTRIL) 20 MG tablet Take 20 mg by mouth daily.    . metroNIDAZOLE (FLAGYL) 500 MG tablet Take 1 tablet (500 mg total) by mouth 2 (two) times daily. 14 tablet 2  . metroNIDAZOLE (METROGEL VAGINAL) 0.75 % vaginal gel Place 1 Applicatorful vaginally 2 (  two) times daily. 70 g 5  . nitrofurantoin, macrocrystal-monohydrate, (MACROBID) 100 MG capsule Take 1 capsule (100 mg total) by mouth 2 (two) times daily. 14 capsule 0  . oxyCODONE (ROXICODONE) 15 MG immediate release tablet Take 15 mg by mouth every 6 (six) hours as needed.    . selenium sulfide (SELSUN) 2.5 % shampoo Apply 1 application topically daily as needed for irritation. 118 mL 2  . terconazole (TERAZOL 7) 0.4 % vaginal cream Place 1 applicator vaginally at bedtime. 45 g 0   No current facility-administered medications for this visit.    Review of Systems Review of Systems Constitutional: negative for fatigue and weight loss Respiratory: negative for cough and wheezing Cardiovascular: negative for chest pain, fatigue and palpitations Gastrointestinal: negative for abdominal pain and change in bowel habits Genitourinary:positive for roach in vagina Integument/breast: negative for nipple  discharge Musculoskeletal:negative for myalgias Neurological: negative for gait problems and tremors Behavioral/Psych: negative for abusive relationship, depression Endocrine: negative for temperature intolerance      Blood pressure (!) 172/78, pulse 73, weight (!) 204 lb (92.5 kg).  Physical Exam Physical Exam           General: Alert and no distress Abdomen:  normal findings: no organomegaly, soft, non-tender and no hernia  Pelvis:  External genitalia: normal general appearance Urinary system: urethral meatus normal and bladder without fullness, nontender Vaginal: normal without tenderness, induration or masses Cervix: normal appearance Adnexa: normal bimanual exam Uterus: anteverted and non-tender, normal size    50% of 15 min visit spent on counseling and coordination of care.   Data Reviewed Wet Prep  Assessment      1. Foreign body in vagina, sequela - normal exam  2. Scalp irritation Rx: - selenium sulfide (SELSUN) 2.5 % shampoo; Apply 1 application topically daily as needed for irritation.  Dispense: 118 mL; Refill: 2  Plan   Follow up prn   Meds ordered this encounter  Medications  . selenium sulfide (SELSUN) 2.5 % shampoo    Sig: Apply 1 application topically daily as needed for irritation.    Dispense:  118 mL    Refill:  2     Shelly Bombard, MD 08/09/2019 9:25 AM

## 2019-08-09 NOTE — Progress Notes (Signed)
Pt believes a roach is in her vagina.

## 2019-08-20 ENCOUNTER — Ambulatory Visit (INDEPENDENT_AMBULATORY_CARE_PROVIDER_SITE_OTHER): Payer: Medicare Other | Admitting: Physician Assistant

## 2019-08-20 ENCOUNTER — Ambulatory Visit: Payer: Self-pay

## 2019-08-20 ENCOUNTER — Encounter: Payer: Self-pay | Admitting: Physician Assistant

## 2019-08-20 VITALS — Ht 62.0 in | Wt 204.0 lb

## 2019-08-20 DIAGNOSIS — M25571 Pain in right ankle and joints of right foot: Secondary | ICD-10-CM

## 2019-08-20 MED ORDER — OXYCODONE-ACETAMINOPHEN 5-325 MG PO TABS: 1.0000 | ORAL_TABLET | ORAL | 0 refills | Status: AC | PRN

## 2019-08-20 NOTE — Progress Notes (Signed)
Office Visit Note   Patient: April Mathis           Date of Birth: 08-30-1950           MRN: [id number] Visit Date: 08/20/2019              Requested by: Nolene Ebbs, MD [address] [place],  [place] [address] PCP: Nolene Ebbs, MD  Chief Complaint  Patient presents with  . Right Foot - Pain    Pt states pain following a fall on EMS truck during transport to ER on 08/08/19  . Right Ankle - Pain      HPI: This is a 69 year old woman who is 11 days status post falling while being transported by EMS to the hospital.  Since then she has had right sided lateral ankle and foot pain.  She comes in today wearing her tennis shoes.  She points to most of her pain focusing on the lateral side of her ankle  Assessment & Plan: Visit Diagnoses:  1. Pain in right ankle and joints of right foot     Plan: Cannot completely rule out a nondisplaced fracture of the lateral malleolus.  Given her symptoms we placed her in a tall cam boot and she will use a cane that she has at home.  Also given her a prescription for vive large compression stockings as I think this would help with her swelling.  I have given her 1 prescription for oxycodone.  She will follow-up in 2 weeks with Dr. Sharol Given.  Follow-Up Instructions: No follow-ups on file.   Ortho Exam  Patient is alert, oriented, no adenopathy, well-dressed, normal affect, normal respiratory effort. Right ankle: Moderate amount of soft tissue swelling especially around the lateral ankle.  No medial sided ankle tenderness.  Minimal tenderness in the foot.  She has minimal swelling in the foot.  No plantar ecchymosis.  She has good strength with plantar flexion and dorsiflexion.  She is able to evert without pain.  She does have some tenderness over the distal lateral malleolus.  Mild tenderness in the ATFL compartments are soft distal CMS is intact  Imaging: No results found. No images are attached to the encounter.  Labs: Lab Results    Component Value Date   HGBA1C 5.5 06/24/2013   HGBA1C 5.4 02/20/2013   HGBA1C 5.5 10/09/2012   REPTSTATUS 06/02/2011 FINAL 05/31/2011   CULT No Herpes Simplex Virus detected. 05/31/2011   LABORGA ESCHERICHIA COLI 01/18/2010     Lab Results  Component Value Date   ALBUMIN 4.3 07/28/2009   ALBUMIN 3.9 11/25/2008   ALBUMIN 4.6 06/07/2007    No results found for: MG No results found for: VD25OH  No results found for: PREALBUMIN CBC EXTENDED Latest Ref Rng & Units 02/10/2016 07/29/2013 10/09/2012  WBC 4.0 - 10.5 K/uL 5.0 5.4 6.1  RBC 3.87 - 5.11 MIL/uL 4.49 4.35 4.66  HGB 12.0 - 15.0 g/dL 12.9 12.4 13.3  HCT 36 - 46 % 39.7 38.6 39.9  PLT 150 - 400 K/uL 191 219 271  NEUTROABS 1.7 - 7.7 K/uL - - 3.1  LYMPHSABS 0.7 - 4.0 K/uL - - 2.4     Body mass index is 37.31 kg/m.  Orders:  Orders Placed This Encounter  Procedures  . XR Foot 2 Views Right  . XR Ankle 2 Views Right   Meds ordered this encounter  Medications  . oxyCODONE-acetaminophen (PERCOCET/ROXICET) 5-325 MG tablet    Sig: Take 1 tablet by mouth every  4 (four) hours as needed for severe pain.    Dispense:  30 tablet    Refill:  0     Procedures: No procedures performed  Clinical Data: No additional findings.  ROS:  All other systems negative, except as noted in the HPI. Review of Systems  Objective: Vital Signs: Ht 5\' 2"  (1.575 m)   Wt 204 lb (92.5 kg)   BMI 37.31 kg/m   Specialty Comments:  No specialty comments available.  PMFS History: Patient Active Problem List   Diagnosis Date Noted  . Chronic pain of left knee 06/29/2016  . Cervical spine pain 05/26/2016  . Lipoma of right upper extremity 05/26/2016  . Spondylolysis, cervical region 12/03/2015  . Vaginitis 06/24/2013  . Stressful life event affecting family 02/20/2013  . DUB (dysfunctional uterine bleeding) 12/05/2011  . Varicose veins of lower extremities with other complications 99/24/2683  . Preventative health care 08/11/2010  .  PREDIABETES 08/17/2009  . Unilateral primary osteoarthritis, left knee 12/28/2005  . TOBACCO ABUSE 12/01/2005  . HYPERTENSION 12/01/2005   Past Medical History:  Diagnosis Date  . Bilateral leg edema   . DVT of lower extremity (deep venous thrombosis) ([place])   . History of recurrent UTIs   . Hypertension   . Knee pain   . Left knee DJD 2002   MRI done in 2002  . Obesity   . Smoking   . Varicose vein     Family History  Problem Relation Age of Onset  . Cancer Mother        LUNG  . Anesthesia problems Neg Hx     Past Surgical History:  Procedure Laterality Date  . KNEE ARTHROSCOPY    . TUBAL LIGATION     Social History   Occupational History  . Not on file  Tobacco Use  . Smoking status: Current Every Day Smoker    Packs/day: 1.00    Years: 20.00    Pack years: 20.00    Types: Cigarettes  . Smokeless tobacco: Never Used  . Tobacco comment: 1/2 PPD  Vaping Use  . Vaping Use: Never used  Substance and Sexual Activity  . Alcohol use: Yes    Comment: "every now and then, not on a regular basis   . Drug use: No  . Sexual activity: Yes    Partners: Male    Birth control/protection: None

## 2019-08-22 ENCOUNTER — Ambulatory Visit: Payer: Medicare Other | Admitting: Orthopedic Surgery

## 2019-08-29 ENCOUNTER — Telehealth: Payer: Self-pay | Admitting: Orthopedic Surgery

## 2019-08-29 NOTE — Telephone Encounter (Signed)
Pt called stating she would like a CB from Dr.Duda regarding the size of her boot and her the dosage of her medicine.  [phone]

## 2019-08-30 ENCOUNTER — Telehealth: Payer: Self-pay | Admitting: Orthopedic Surgery

## 2019-08-30 NOTE — Telephone Encounter (Signed)
I called pt and she was yelling stating that she only wants to speak with Dr. Sharol Given and that she just wants someone to be honest about where he is and when he will be back. I advised the pt that he is not here and will not be here until Thursday os next week. She wants to see him in the office so I sch appt for Thursday at 10 am .

## 2019-08-30 NOTE — Telephone Encounter (Signed)
Patient called and asked directly to Dr. Sharol Given. Patient phone number [phone]  [phone]. Patient has very up set and complained that she hate our business and wishes only to speak to Dr. Sharol Given. Please give patient a call.

## 2019-08-30 NOTE — Telephone Encounter (Signed)
Duplicate message. 

## 2019-09-03 ENCOUNTER — Ambulatory Visit: Payer: Medicare Other | Admitting: Physician Assistant

## 2019-09-05 ENCOUNTER — Ambulatory Visit (INDEPENDENT_AMBULATORY_CARE_PROVIDER_SITE_OTHER): Payer: Medicare Other | Admitting: Orthopedic Surgery

## 2019-09-05 ENCOUNTER — Encounter: Payer: Self-pay | Admitting: Orthopedic Surgery

## 2019-09-05 VITALS — Ht 62.0 in | Wt 204.0 lb

## 2019-09-05 DIAGNOSIS — S93411A Sprain of calcaneofibular ligament of right ankle, initial encounter: Secondary | ICD-10-CM

## 2019-09-05 NOTE — Progress Notes (Signed)
Office Visit Note   Patient: April Mathis           Date of Birth: [birth year]           MRN: [id number] Visit Date: 09/05/2019              Requested by: Nolene Ebbs, MD [address] [place],  [place] [address] PCP: Nolene Ebbs, MD  Chief Complaint  Patient presents with  . Right Ankle - Follow-up    S/p fall 08/08/19      HPI: Patient is a 69 year old woman who is seen for initial evaluation for her right ankle sprain.  Patient states that she was trying to get into an EMS truck when she twisted her ankle and fell.  Patient was placed in a fracture boot pain and presents complaining of persistent pain and swelling.  Assessment & Plan: Visit Diagnoses:  1. Sprain of calcaneofibular ligament of right ankle, initial encounter     Plan: Patient states his difficulty ambulating fracture boot we will place her in an ankle stabilizing orthosis recommend that she wear this for 6 to 8 weeks.  Follow-up if she is still symptomatic at that time.  Follow-Up Instructions: Return if symptoms worsen or fail to improve.   Ortho Exam  Patient is alert, oriented, no adenopathy, well-dressed, normal affect, normal respiratory effort. Examination patient does have swelling over the lateral aspect of her ankle.  The syndesmosis is nontender to palpation.  She is tender to palpation of the anterior talofibular ligament anterior drawer is stable deltoid ligament is nontender to palpation the proximal fibula is nontender to palpation.  Review of the radiographs shows no evidence of a fracture no avulsion.  Imaging: No results found. No images are attached to the encounter.  Labs: Lab Results  Component Value Date   HGBA1C 5.5 06/24/2013   HGBA1C 5.4 02/20/2013   HGBA1C 5.5 10/09/2012   REPTSTATUS 06/02/2011 FINAL 05/31/2011   CULT No Herpes Simplex Virus detected. 05/31/2011   LABORGA ESCHERICHIA COLI 01/18/2010     Lab Results  Component Value Date   ALBUMIN 4.3 07/28/2009     ALBUMIN 3.9 11/25/2008   ALBUMIN 4.6 06/07/2007    No results found for: MG No results found for: VD25OH  No results found for: PREALBUMIN CBC EXTENDED Latest Ref Rng & Units 02/10/2016 07/29/2013 10/09/2012  WBC 4.0 - 10.5 K/uL 5.0 5.4 6.1  RBC 3.87 - 5.11 MIL/uL 4.49 4.35 4.66  HGB 12.0 - 15.0 g/dL 12.9 12.4 13.3  HCT 36 - 46 % 39.7 38.6 39.9  PLT 150 - 400 K/uL 191 219 271  NEUTROABS 1.7 - 7.7 K/uL - - 3.1  LYMPHSABS 0.7 - 4.0 K/uL - - 2.4     Body mass index is 37.31 kg/m.  Orders:  No orders of the defined types were placed in this encounter.  No orders of the defined types were placed in this encounter.    Procedures: No procedures performed  Clinical Data: No additional findings.  ROS:  All other systems negative, except as noted in the HPI. Review of Systems  Objective: Vital Signs: Ht 5\' 2"  (1.575 m)   Wt 204 lb (92.5 kg)   BMI 37.31 kg/m   Specialty Comments:  No specialty comments available.  PMFS History: Patient Active Problem List   Diagnosis Date Noted  . Chronic pain of left knee 06/29/2016  . Cervical spine pain 05/26/2016  . Lipoma of right upper extremity 05/26/2016  . Spondylolysis, cervical  region 12/03/2015  . Vaginitis 06/24/2013  . Stressful life event affecting family 02/20/2013  . DUB (dysfunctional uterine bleeding) 12/05/2011  . Varicose veins of lower extremities with other complications 54/56/2563  . Preventative health care 08/11/2010  . PREDIABETES 08/17/2009  . Unilateral primary osteoarthritis, left knee 12/28/2005  . TOBACCO ABUSE 12/01/2005  . HYPERTENSION 12/01/2005   Past Medical History:  Diagnosis Date  . Bilateral leg edema   . DVT of lower extremity (deep venous thrombosis) ([place])   . History of recurrent UTIs   . Hypertension   . Knee pain   . Left knee DJD 2002   MRI done in 2002  . Obesity   . Smoking   . Varicose vein     Family History  Problem Relation Age of Onset  . Cancer Mother         LUNG  . Anesthesia problems Neg Hx     Past Surgical History:  Procedure Laterality Date  . KNEE ARTHROSCOPY    . TUBAL LIGATION     Social History   Occupational History  . Not on file  Tobacco Use  . Smoking status: Current Every Day Smoker    Packs/day: 1.00    Years: 20.00    Pack years: 20.00    Types: Cigarettes  . Smokeless tobacco: Never Used  . Tobacco comment: 1/2 PPD  Vaping Use  . Vaping Use: Never used  Substance and Sexual Activity  . Alcohol use: Yes    Comment: "every now and then, not on a regular basis   . Drug use: No  . Sexual activity: Yes    Partners: Male    Birth control/protection: None

## 2019-09-06 ENCOUNTER — Telehealth: Payer: Self-pay

## 2019-09-06 NOTE — Telephone Encounter (Signed)
On 09/05/19-I talked with patient once she was roomed and advised that it was not appropriate for her to make such comments to our patients sitting in the waiting room and neither was it ok for her to speak to our staff in such a manner.  I did speak with the other patient she was talking to in the lobby to make sure she was ok. I made Dr Sharol Given aware of what transpired and Dr Sharol Given states he addressed with patient and advised her it was not acceptable behavior to our staff or our patients.   From: Smitty [email or web address]  Sent: Thursday, September 05, 2019 10:52 AM To: Prosper Paff [email or web address]> Subject:   April Mathis came in late and started knocking on the glass aggressively since I was on the phone, then once i checked her in she told me i was being too nice to the patients and that " I needed to stop sucking white behind" and then she asked a patient for the time and when she didn't have it she began saying Caucasians are the reason we've got covid, referred to African Americans being brought over to [place] and even started talking about Caucasians having to face Gods wrath

## 2019-09-20 ENCOUNTER — Other Ambulatory Visit: Payer: Self-pay

## 2019-09-20 ENCOUNTER — Ambulatory Visit (INDEPENDENT_AMBULATORY_CARE_PROVIDER_SITE_OTHER): Payer: Medicare Other | Admitting: Podiatry

## 2019-09-20 ENCOUNTER — Encounter: Payer: Self-pay | Admitting: Podiatry

## 2019-09-20 DIAGNOSIS — R600 Localized edema: Secondary | ICD-10-CM

## 2019-09-20 DIAGNOSIS — M779 Enthesopathy, unspecified: Secondary | ICD-10-CM | POA: Diagnosis not present

## 2019-09-20 DIAGNOSIS — S93401A Sprain of unspecified ligament of right ankle, initial encounter: Secondary | ICD-10-CM | POA: Diagnosis not present

## 2019-09-24 NOTE — Progress Notes (Signed)
Subjective:   Patient ID: April Mathis, female   DOB: 69 y.o.   MRN: [id number]   HPI Patient presents stating that she had an injury to her ankle and was given conflicting answers.  States that it sore within the ankle itself this occurred a couple months ago and she wants to have it checked again.  Patient does not smoke likes to be active but is not able due to the pain   Review of Systems  All other systems reviewed and are negative.       Objective:  Physical Exam Vitals and nursing note reviewed.  Constitutional:      Appearance: She is well-developed.  Pulmonary:     Effort: Pulmonary effort is normal.  Musculoskeletal:        General: Normal range of motion.  Skin:    General: Skin is warm.  Neurological:     Mental Status: She is alert.     Neurovascular status was found to be intact muscle strength was found to be adequate range of motion is limited on the right side secondary to splinting and pain.  There is moderate edema around the ankle right negative Bevelyn Buckles' sign was noted and patient does have moderate obesity is complicating factor.  There is exquisite discomfort in the sinus tarsi right with inflammation fluid buildup within the joint     Assessment:  Possibility that there could be some kind of chronic bone ankle injury versus an inflammatory condition which is painful with swelling obesity associated with it     Plan:  H&P reviewed conditions x-ray reviewed at this time.  Today I did a sterile prep and I did inject the sinus tarsi 3 mg dexamethasone 5 mg Xylocaine to reduce the inflammation dispensed an ankle compression stocking to help with swelling advised on elevation ice therapy and hopefully this will get better over time.  Patient is encouraged to come back if symptoms do not improve  X-rays indicate that there does not appear to be any broken bones or diastases injury appears to be soft tissue

## 2020-02-19 ENCOUNTER — Telehealth: Payer: Self-pay

## 2020-02-19 NOTE — Telephone Encounter (Signed)
I called and lm on vm to advise that we need to see her in the office to evaluate but until that time she can try conservative options like ice and OTC pain reliever to call with any other questions.
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
## 2020-02-20 ENCOUNTER — Ambulatory Visit: Payer: Medicare Other | Admitting: Orthopedic Surgery
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
## 2020-02-27 ENCOUNTER — Ambulatory Visit: Payer: Self-pay

## 2020-02-27 ENCOUNTER — Ambulatory Visit (INDEPENDENT_AMBULATORY_CARE_PROVIDER_SITE_OTHER): Payer: Medicare Other | Admitting: Orthopedic Surgery

## 2020-02-27 DIAGNOSIS — M79642 Pain in left hand: Secondary | ICD-10-CM | POA: Diagnosis not present

## 2020-03-06 ENCOUNTER — Encounter: Payer: Self-pay | Admitting: Orthopedic Surgery
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
## 2020-08-13 LAB — COLOGUARD: COLOGUARD: NEGATIVE

## 2020-08-13 LAB — EXTERNAL GENERIC LAB PROCEDURE: COLOGUARD: NEGATIVE

## 2020-09-30 ENCOUNTER — Other Ambulatory Visit: Payer: Self-pay

## 2020-09-30 ENCOUNTER — Other Ambulatory Visit (HOSPITAL_COMMUNITY)
Admission: RE | Admit: 2020-09-30 | Discharge: 2020-09-30 | Disposition: A | Discharge status: Home / Self Care | Payer: Medicare Other | Source: Ambulatory Visit | Attending: Obstetrics | Admitting: Obstetrics

## 2020-09-30 ENCOUNTER — Encounter: Payer: Self-pay | Admitting: Obstetrics

## 2020-09-30 ENCOUNTER — Ambulatory Visit (INDEPENDENT_AMBULATORY_CARE_PROVIDER_SITE_OTHER): Payer: Medicare Other | Admitting: Obstetrics

## 2020-09-30 VITALS — BP 162/80 | HR 75 | Wt 203.0 lb

## 2020-09-30 DIAGNOSIS — I10 Essential (primary) hypertension: Secondary | ICD-10-CM

## 2020-09-30 DIAGNOSIS — F17200 Nicotine dependence, unspecified, uncomplicated: Secondary | ICD-10-CM

## 2020-09-30 DIAGNOSIS — N898 Other specified noninflammatory disorders of vagina: Secondary | ICD-10-CM | POA: Diagnosis present

## 2020-09-30 DIAGNOSIS — Z1151 Encounter for screening for human papillomavirus (HPV): Secondary | ICD-10-CM | POA: Diagnosis not present

## 2020-09-30 DIAGNOSIS — E669 Obesity, unspecified: Secondary | ICD-10-CM

## 2020-09-30 DIAGNOSIS — Z113 Encounter for screening for infections with a predominantly sexual mode of transmission: Secondary | ICD-10-CM | POA: Diagnosis not present

## 2020-09-30 DIAGNOSIS — Z01419 Encounter for gynecological examination (general) (routine) without abnormal findings: Secondary | ICD-10-CM

## 2020-09-30 NOTE — Progress Notes (Signed)
Subjective:        April Mathis is a 70 y.o. female here for a routine exam.  Current complaints: Vaginal discharge.    Personal health questionnaire:  Is patient Ashkenazi Jewish, have a family history of breast and/or ovarian cancer: no Is there a family history of uterine cancer diagnosed at age < 8, gastrointestinal cancer, urinary tract cancer, family member who is a Field seismologist syndrome-associated carrier: no Is the patient overweight and hypertensive, family history of diabetes, personal history of gestational diabetes, preeclampsia or PCOS: yes Is patient over 32, have PCOS,  family history of premature CHD under age 97, diabetes, smoke, have hypertension or peripheral artery disease:  yes At any time, has a partner hit, kicked or otherwise hurt or frightened you?: no Over the past 2 weeks, have you felt down, depressed or hopeless?: no Over the past 2 weeks, have you felt little interest or pleasure in doing things?:no   Gynecologic History No LMP recorded. Patient is postmenopausal. Contraception: post menopausal status Last Pap: 04-18-2019. Results were: normal Last mammogram: 2022. Results were: normal  Obstetric History OB History  Gravida Para Term Preterm AB Living  '6 4 4 '$ 0 2 4  SAB IAB Ectopic Multiple Live Births  1 1 0 0 4    # Outcome Date GA Lbr Len/2nd Weight Sex Delivery Anes PTL Lv  6 SAB           5 Term           4 Term           3 Term           2 Term           1 IAB             Past Medical History:  Diagnosis Date   Bilateral leg edema    DVT of lower extremity (deep venous thrombosis) ([facility])    History of recurrent UTIs    Hypertension    Knee pain    Left knee DJD 2002   MRI done in 2002   Obesity    Smoking    Varicose vein     Past Surgical History:  Procedure Laterality Date   KNEE ARTHROSCOPY     TUBAL LIGATION       Current Outpatient Medications:    amLODipine (NORVASC) 5 MG tablet, Take 5 mg by mouth daily., Disp: , Rfl:     aspirin EC 81 MG tablet, Take 81 mg by mouth daily. , Disp: , Rfl:    buPROPion (ZYBAN) 150 MG 12 hr tablet, TAKE 1 TABLET BY MOUTH EVERY MORNING FOR 3 DAYS THEN TWICE A DAY, Disp: , Rfl:    lisinopril (PRINIVIL,ZESTRIL) 20 MG tablet, Take 20 mg by mouth daily., Disp: , Rfl:    cefUROXime (CEFTIN) 500 MG tablet, Take 1 tablet (500 mg total) by mouth 2 (two) times daily with a meal. (Patient not taking: Reported on 09/30/2020), Disp: 14 tablet, Rfl: 0   clotrimazole (LOTRIMIN) 1 % cream, Apply 1 application topically 2 (two) times daily. (Patient not taking: Reported on 09/30/2020), Disp: 60 g, Rfl: 2   clotrimazole (LOTRIMIN) 1 % cream, Apply 1 application topically 2 (two) times daily. (Patient not taking: Reported on 09/30/2020), Disp: 113 g, Rfl: 1   cyclobenzaprine (FLEXERIL) 10 MG tablet, Take 10 mg by mouth 3 (three) times daily as needed., Disp: , Rfl:    cyclobenzaprine (FLEXERIL) 5 MG tablet, Take 5  mg by mouth 3 (three) times daily as needed for pain. , Disp: , Rfl:    doxycycline (VIBRAMYCIN) 100 MG capsule, Take 1 capsule (100 mg total) by mouth 2 (two) times daily. Avoid dairy products while taking Doxycycline. (Patient not taking: Reported on 09/30/2020), Disp: 14 capsule, Rfl: 0   metroNIDAZOLE (FLAGYL) 500 MG tablet, Take 1 tablet (500 mg total) by mouth 2 (two) times daily. (Patient not taking: Reported on 09/30/2020), Disp: 14 tablet, Rfl: 2   metroNIDAZOLE (METROGEL VAGINAL) 0.75 % vaginal gel, Place 1 Applicatorful vaginally 2 (two) times daily. (Patient not taking: Reported on 09/30/2020), Disp: 70 g, Rfl: 5   nitrofurantoin, macrocrystal-monohydrate, (MACROBID) 100 MG capsule, Take 1 capsule (100 mg total) by mouth 2 (two) times daily. (Patient not taking: Reported on 09/30/2020), Disp: 14 capsule, Rfl: 0   oxyCODONE (ROXICODONE) 15 MG immediate release tablet, Take 15 mg by mouth every 6 (six) hours as needed. (Patient not taking: Reported on 09/30/2020), Disp: , Rfl:     oxyCODONE-acetaminophen (PERCOCET/ROXICET) 5-325 MG tablet, Take 1 tablet by mouth every 4 (four) hours as needed for severe pain. (Patient not taking: Reported on 09/30/2020), Disp: 30 tablet, Rfl: 0   selenium sulfide (SELSUN) 2.5 % shampoo, Apply 1 application topically daily as needed for irritation. (Patient not taking: Reported on 09/30/2020), Disp: 118 mL, Rfl: 2   terconazole (TERAZOL 7) 0.4 % vaginal cream, Place 1 applicator vaginally at bedtime. (Patient not taking: Reported on 09/30/2020), Disp: 45 g, Rfl: 0 Allergies  Allergen Reactions   Hctz [Hydrochlorothiazide] Other (See Comments)    Reaction:  Drops pts BP   Penicillins Hives and Other (See Comments)    Has patient had a PCN reaction causing immediate rash, facial/tongue/throat swelling, SOB or lightheadedness with hypotension: No Has patient had a PCN reaction causing severe rash involving mucus membranes or skin necrosis: No Has patient had a PCN reaction that required hospitalization No Has patient had a PCN reaction occurring within the last 10 years: No If all of the above answers are "NO", then may proceed with Cephalosporin use.   Ceftin [Cefuroxime] Diarrhea    Social History   Tobacco Use   Smoking status: Every Day    Packs/day: 1.00    Years: 20.00    Pack years: 20.00    Types: Cigarettes   Smokeless tobacco: Never   Tobacco comments:    1/2 PPD  Substance Use Topics   Alcohol use: Yes    Comment: "every now and then, not on a regular basis     Family History  Problem Relation Age of Onset   Cancer Mother        LUNG   Anesthesia problems Neg Hx       Review of Systems  Constitutional: negative for fatigue and weight loss Respiratory: negative for cough and wheezing Cardiovascular: negative for chest pain, fatigue and palpitations Gastrointestinal: negative for abdominal pain and change in bowel habits Musculoskeletal:negative for myalgias Neurological: negative for gait problems and  tremors Behavioral/Psych: negative for abusive relationship, depression Endocrine: negative for temperature intolerance    Genitourinary: positive for vaginal discharge.  negative for abnormal menstrual periods, genital lesions, hot flashes, sexual problems Integument/breast: negative for breast lump, breast tenderness, nipple discharge and skin lesion(s)    Objective:       BP (!) 162/80   Pulse 75   Wt 203 lb (92.1 kg)   BMI 37.13 kg/m  General:   Alert and no distress  Skin:  no rash or abnormalities  Lungs:   clear to auscultation bilaterally  Heart:   regular rate and rhythm, S1, S2 normal, no murmur, click, rub or gallop  Breasts:   normal without suspicious masses, skin or nipple changes or axillary nodes  Abdomen:  normal findings: no organomegaly, soft, non-tender and no hernia  Pelvis:  External genitalia: normal general appearance Urinary system: urethral meatus normal and bladder without fullness, nontender Vaginal: normal without tenderness, induration or masses Cervix: normal appearance Adnexa: normal bimanual exam Uterus: anteverted and non-tender, normal size   Lab Review Urine pregnancy test Labs reviewed yes Radiologic studies reviewed yes  I have spent a total of 20 minutes of face-to-face time, excluding clinical staff time, reviewing notes and preparing to see patient, ordering tests and/or medications, and counseling the patient.   Assessment:    1. Encounter for routine gynecological examination with Papanicolaou smear of cervix Rx: - Cervicovaginal ancillary only( [facility])  2. Vaginal discharge Rx: - Cytology - PAP( [facility])  3. Screen for STD (sexually transmitted disease) Rx: - HIV antibody (with reflex) - RPR - Hepatitis B Surface AntiGEN - Hepatitis C Antibody  4. HTN (hypertension), benign - managed by PCP  5. Obesity (BMI 35.0-39.9 without comorbidity) - weight reduction with the aid of dietary changes, exercise and  behavioral modification recommended  6. TOBACCO ABUSE - cessation with the aid of medication and behavioral modification recommended    Plan:    Education reviewed: calcium supplements, depression evaluation, low fat, low cholesterol diet, safe sex/STD prevention, self breast exams, smoking cessation, and weight bearing exercise. Follow up in: 1 year.     Orders Placed This Encounter  Procedures   HIV antibody (with reflex)   RPR   Hepatitis B Surface AntiGEN   Hepatitis C Antibody     Shelly Bombard, MD 09/30/2020 9:09 AM

## 2020-09-30 NOTE — Progress Notes (Signed)
Pt is in the office for annual Last pap 04-18-2019 Reports vaginal discharge, irritation.

## 2020-10-01 LAB — CERVICOVAGINAL ANCILLARY ONLY
Bacterial Vaginitis (gardnerella): NEGATIVE
Candida Glabrata: NEGATIVE
Candida Vaginitis: POSITIVE — AB
Chlamydia: NEGATIVE
Comment: NEGATIVE
Comment: NEGATIVE
Comment: NEGATIVE
Comment: NEGATIVE
Comment: NEGATIVE
Comment: NORMAL
Neisseria Gonorrhea: NEGATIVE
Trichomonas: NEGATIVE

## 2020-10-01 LAB — RPR: RPR Ser Ql: NONREACTIVE

## 2020-10-01 LAB — HIV ANTIBODY (ROUTINE TESTING W REFLEX): HIV Screen 4th Generation wRfx: NONREACTIVE

## 2020-10-01 LAB — HEPATITIS C ANTIBODY: Hep C Virus Ab: <0.1 s/co ratio (ref 0.0–0.9)

## 2020-10-01 LAB — HEPATITIS B SURFACE ANTIGEN: Hepatitis B Surface Ag: NEGATIVE

## 2020-10-02 ENCOUNTER — Other Ambulatory Visit: Payer: Self-pay | Admitting: Obstetrics

## 2020-10-02 DIAGNOSIS — B3731 Acute candidiasis of vulva and vagina: Secondary | ICD-10-CM

## 2020-10-02 DIAGNOSIS — B373 Candidiasis of vulva and vagina: Secondary | ICD-10-CM

## 2020-10-02 MED ORDER — FLUCONAZOLE 150 MG PO TABS: 150.0000 mg | ORAL_TABLET | Freq: Once | ORAL | 0 refills | Status: DC

## 2020-10-05 LAB — CYTOLOGY - PAP
Comment: NEGATIVE
Diagnosis: NEGATIVE
High risk HPV: NEGATIVE

## 2020-10-20 ENCOUNTER — Other Ambulatory Visit: Payer: Self-pay | Admitting: Internal Medicine

## 2020-10-21 LAB — TSH: TSH: 2.73 mIU/L (ref 0.40–4.50)

## 2020-10-21 LAB — COMPLETE METABOLIC PANEL WITH GFR
AG Ratio: 1.7 (calc) (ref 1.0–2.5)
ALT: 14 U/L (ref 6–29)
AST: 17 U/L (ref 10–35)
Albumin: 4.2 g/dL (ref 3.6–5.1)
Alkaline phosphatase (APISO): 55 U/L (ref 37–153)
BUN: 15 mg/dL (ref 7–25)
CO2: 24 mmol/L (ref 20–32)
Calcium: 9.3 mg/dL (ref 8.6–10.4)
Chloride: 102 mmol/L (ref 98–110)
Creat: 0.83 mg/dL (ref 0.60–1.00)
Globulin: 2.5 g/dL (calc) (ref 1.9–3.7)
Glucose, Bld: 116 mg/dL — ABNORMAL HIGH (ref 65–99)
Potassium: 4.5 mmol/L (ref 3.5–5.3)
Sodium: 138 mmol/L (ref 135–146)
Total Bilirubin: 0.4 mg/dL (ref 0.2–1.2)
Total Protein: 6.7 g/dL (ref 6.1–8.1)
eGFR: 76 mL/min/{1.73_m2} (ref >=60)

## 2020-10-21 LAB — CBC
HCT: 39.5 % (ref 35.0–45.0)
Hemoglobin: 12.5 g/dL (ref 11.7–15.5)
MCH: 27 pg (ref 27.0–33.0)
MCHC: 31.6 g/dL — ABNORMAL LOW (ref 32.0–36.0)
MCV: 85.3 fL (ref 80.0–100.0)
MPV: 11.7 fL (ref 7.5–12.5)
Platelets: 244 10*3/uL (ref 140–400)
RBC: 4.63 10*6/uL (ref 3.80–5.10)
RDW: 14.2 % (ref 11.0–15.0)
WBC: 6.3 10*3/uL (ref 3.8–10.8)

## 2020-10-21 LAB — LIPID PANEL
Cholesterol: 136 mg/dL (ref <200)
HDL: 39 mg/dL — ABNORMAL LOW (ref >=50)
LDL Cholesterol (Calc): 75 mg/dL (calc)
Non-HDL Cholesterol (Calc): 97 mg/dL (calc) (ref <130)
Total CHOL/HDL Ratio: 3.5 (calc) (ref <5.0)
Triglycerides: 141 mg/dL (ref <150)

## 2020-11-04 ENCOUNTER — Ambulatory Visit: Payer: Medicare Other | Admitting: Podiatry

## 2020-11-10 ENCOUNTER — Ambulatory Visit: Payer: Self-pay

## 2020-11-10 ENCOUNTER — Other Ambulatory Visit: Payer: Self-pay

## 2020-11-10 ENCOUNTER — Ambulatory Visit (INDEPENDENT_AMBULATORY_CARE_PROVIDER_SITE_OTHER): Payer: Medicare Other | Admitting: Orthopedic Surgery

## 2020-11-10 ENCOUNTER — Encounter: Payer: Self-pay | Admitting: Orthopedic Surgery

## 2020-11-10 DIAGNOSIS — I872 Venous insufficiency (chronic) (peripheral): Secondary | ICD-10-CM | POA: Diagnosis not present

## 2020-11-10 DIAGNOSIS — G8929 Other chronic pain: Secondary | ICD-10-CM | POA: Diagnosis not present

## 2020-11-10 DIAGNOSIS — M25562 Pain in left knee: Secondary | ICD-10-CM

## 2020-11-10 DIAGNOSIS — M25572 Pain in left ankle and joints of left foot: Secondary | ICD-10-CM

## 2020-11-10 DIAGNOSIS — M25571 Pain in right ankle and joints of right foot: Secondary | ICD-10-CM

## 2020-11-10 MED ORDER — LIDOCAINE HCL (PF) 1 % IJ SOLN
5.0000 mL | INTRAMUSCULAR | Status: AC | PRN
  Administered 2020-11-10: 5 mL

## 2020-11-10 MED ORDER — METHYLPREDNISOLONE ACETATE 40 MG/ML IJ SUSP
40.0000 mg | INTRAMUSCULAR | Status: AC | PRN
  Administered 2020-11-10: 40 mg via INTRA_ARTICULAR

## 2020-11-10 NOTE — Progress Notes (Signed)
Office Visit Note   Patient: April Mathis           Date of Birth: 10-Feb-1950           MRN: [id number] Visit Date: 11/10/2020              Requested by: Nolene Ebbs, MD [address] [place],  [place] [address] PCP: Nolene Ebbs, MD  Chief Complaint  Patient presents with   Right Ankle - Pain   Left Ankle - Pain   Left Knee - Pain      HPI: Patient is a 70 year old woman who presents with chronic arthritis of her left knee as well as persistent bilateral ankle pain.  Patient states that her ankle pain started in 2020 when she was getting on an EMS truck.  Patient states that the cold weather makes her knee feels worse she has been using ibuprofen.  Assessment & Plan: Visit Diagnoses:  1. Pain in right ankle and joints of right foot   2. Pain in left ankle and joints of left foot   3. Chronic pain of left knee     Plan: Recommended using Voltaren gel on the ankles.  The left knee was injected.  Recommended extra-large knee-high compression stockings for the venous insufficiency.  Follow-Up Instructions: No follow-ups on file.   Ortho Exam  Patient is alert, oriented, no adenopathy, well-dressed, normal affect, normal respiratory effort. Examination of both ankles feet and leg swelling from venous insufficiency.  Patient is status post vascular intervention to her left leg.  There is no redness or cellulitis in the leg there is pitting edema up to the tibial tubercle bilaterally.  Both calves measure 42 cm in circumference.  Examination of the left knee she is primarily tender over the medial joint line collaterals are cruciates are stable there is no effusion.  Both ankles have good range of motion both subtalar joints have good range of motion anterior drawer is stable bilaterally.  Imaging: XR Knee 1-2 Views Left  Result Date: 11/10/2020 2 view left knee shows progressive calcification of the popliteal vessels with joint space narrowing medially with  periarticular bony spurs worse in the medial joint space.  XR Ankle 2 Views Left  Result Date: 11/10/2020 2 view radiographs of the left ankle shows a congruent joint no osteochondral defects  XR Ankle 2 Views Right  Result Date: 11/10/2020 2 view radiographs of the right ankle shows a congruent joint with no osteochondral defects.  No images are attached to the encounter.  Labs: Lab Results  Component Value Date   HGBA1C 5.5 06/24/2013   HGBA1C 5.4 02/20/2013   HGBA1C 5.5 10/09/2012   REPTSTATUS 06/02/2011 FINAL 05/31/2011   CULT No Herpes Simplex Virus detected. 05/31/2011   LABORGA ESCHERICHIA COLI 01/18/2010     Lab Results  Component Value Date   ALBUMIN 4.3 07/28/2009   ALBUMIN 3.9 11/25/2008   ALBUMIN 4.6 06/07/2007    No results found for: MG No results found for: VD25OH  No results found for: PREALBUMIN CBC EXTENDED Latest Ref Rng & Units 10/20/2020 02/10/2016 07/29/2013  WBC 3.8 - 10.8 Thousand/uL 6.3 5.0 5.4  RBC 3.80 - 5.10 Million/uL 4.63 4.49 4.35  HGB 11.7 - 15.5 g/dL 12.5 12.9 12.4  HCT 35.0 - 45.0 % 39.5 39.7 38.6  PLT 140 - 400 Thousand/uL 244 191 219  NEUTROABS 1.7 - 7.7 K/uL - - -  LYMPHSABS 0.7 - 4.0 K/uL - - -     There  is no height or weight on file to calculate BMI.  Orders:  Orders Placed This Encounter  Procedures   XR Ankle 2 Views Right   XR Ankle 2 Views Left   XR Knee 1-2 Views Left   No orders of the defined types were placed in this encounter.    Procedures: Large Joint Inj: L knee on 11/10/2020 3:00 PM Indications: pain and diagnostic evaluation Details: 22 G 1.5 in needle, anteromedial approach  Arthrogram: No  Medications: 5 mL lidocaine (PF) 1 %; 40 mg methylPREDNISolone acetate 40 MG/ML Outcome: tolerated well, no immediate complications Procedure, treatment alternatives, risks and benefits explained, specific risks discussed. Consent was given by the patient. Immediately prior to procedure a time out was called to  verify the correct patient, procedure, equipment, support staff and site/side marked as required. Patient was prepped and draped in the usual sterile fashion.     Clinical Data: No additional findings.  ROS:  All other systems negative, except as noted in the HPI. Review of Systems  Objective: Vital Signs: There were no vitals taken for this visit.  Specialty Comments:  No specialty comments available.  PMFS History: Patient Active Problem List   Diagnosis Date Noted   Chronic pain of left knee 06/29/2016   Cervical spine pain 05/26/2016   Lipoma of right upper extremity 05/26/2016   Spondylolysis, cervical region 12/03/2015   Vaginitis 06/24/2013   Stressful life event affecting family 02/20/2013   DUB (dysfunctional uterine bleeding) 12/05/2011   Varicose veins of lower extremities with other complications 63/87/5643   Preventative health care 08/11/2010   PREDIABETES 08/17/2009   Unilateral primary osteoarthritis, left knee 12/28/2005   TOBACCO ABUSE 12/01/2005   HYPERTENSION 12/01/2005   Past Medical History:  Diagnosis Date   Bilateral leg edema    DVT of lower extremity (deep venous thrombosis) ([facility])    History of recurrent UTIs    Hypertension    Knee pain    Left knee DJD 2002   MRI done in 2002   Obesity    Smoking    Varicose vein     Family History  Problem Relation Age of Onset   Cancer Mother        LUNG   Anesthesia problems Neg Hx     Past Surgical History:  Procedure Laterality Date   KNEE ARTHROSCOPY     TUBAL LIGATION     Social History   Occupational History   Not on file  Tobacco Use   Smoking status: Every Day    Packs/day: 1.00    Years: 20.00    Pack years: 20.00    Types: Cigarettes   Smokeless tobacco: Never   Tobacco comments:    1/2 PPD  Vaping Use   Vaping Use: Never used  Substance and Sexual Activity   Alcohol use: Yes    Comment: "every now and then, not on a regular basis    Drug use: No   Sexual activity:  Yes    Partners: Male    Birth control/protection: None

## 2020-11-13 ENCOUNTER — Telehealth: Payer: Self-pay

## 2020-11-13 NOTE — Telephone Encounter (Signed)
Pt called and states that she needs something for pain and that while here at her last visit Dr. Sharol Given did not do anything to help her. I advised Dr. Sharol Given recommended that she obtain compression socks for swelling and voltaren gel for ankle pain. Pt states that she does not get paid till the 3rd and she needs something now but she

## 2020-12-08 ENCOUNTER — Ambulatory Visit: Payer: Medicare Other | Admitting: Orthopedic Surgery

## 2020-12-23 ENCOUNTER — Other Ambulatory Visit: Payer: Self-pay | Admitting: Obstetrics

## 2020-12-23 DIAGNOSIS — R238 Other skin changes: Secondary | ICD-10-CM

## 2020-12-23 MED ORDER — SELENIUM SULFIDE 2.5 % EX LOTN: 1.0000 "application " | TOPICAL_LOTION | Freq: Every day | CUTANEOUS | 4 refills | Status: DC | PRN

## 2020-12-28 ENCOUNTER — Other Ambulatory Visit: Payer: Self-pay

## 2020-12-28 ENCOUNTER — Other Ambulatory Visit: Payer: Self-pay | Admitting: Obstetrics

## 2020-12-28 DIAGNOSIS — N760 Acute vaginitis: Secondary | ICD-10-CM

## 2020-12-28 DIAGNOSIS — A599 Trichomoniasis, unspecified: Secondary | ICD-10-CM

## 2020-12-28 DIAGNOSIS — B9689 Other specified bacterial agents as the cause of diseases classified elsewhere: Secondary | ICD-10-CM

## 2020-12-28 MED ORDER — METRONIDAZOLE 500 MG PO TABS: 500.0000 mg | ORAL_TABLET | Freq: Two times a day (BID) | ORAL | 2 refills | Status: AC

## 2020-12-28 NOTE — Progress Notes (Signed)
Pt c/o vaginal d/c with odor. Request Rx for flagyl. Sent to pharmacy.

## 2021-03-15 ENCOUNTER — Ambulatory Visit: Payer: Medicare Other | Admitting: Orthopedic Surgery

## 2021-03-18 ENCOUNTER — Ambulatory Visit (INDEPENDENT_AMBULATORY_CARE_PROVIDER_SITE_OTHER): Payer: Medicare Other | Admitting: Orthopedic Surgery

## 2021-03-18 ENCOUNTER — Encounter: Payer: Self-pay | Admitting: Orthopedic Surgery

## 2021-03-18 DIAGNOSIS — M25572 Pain in left ankle and joints of left foot: Secondary | ICD-10-CM

## 2021-03-18 DIAGNOSIS — M25562 Pain in left knee: Secondary | ICD-10-CM | POA: Diagnosis not present

## 2021-03-18 DIAGNOSIS — I872 Venous insufficiency (chronic) (peripheral): Secondary | ICD-10-CM

## 2021-03-18 DIAGNOSIS — M25571 Pain in right ankle and joints of right foot: Secondary | ICD-10-CM | POA: Diagnosis not present

## 2021-03-18 DIAGNOSIS — G8929 Other chronic pain: Secondary | ICD-10-CM

## 2021-03-18 NOTE — Progress Notes (Signed)
? ?Office Visit Note ?  ?Patient: April Mathis           ?Date of Birth: June 23, 1950           ?MRN: [id number] ?Visit Date: 03/18/2021 ?             ?Requested by: Nolene Ebbs, MD ?[address] ?[place],  [place] [address] ?PCP: Nolene Ebbs, MD ? ?Chief Complaint  ?Patient presents with  ? Left Knee - Follow-up  ? Right Ankle - Follow-up  ? ? ? ? ?HPI: ?Patient is a 71 year old woman who presents complaining of persistent pain and swelling in both ankles as well as chronic left knee pain.  She had been recommended trying Voltaren gel but she states that this is too expensive.  She is status post a left knee injection and states that her left knee does not bother her much at this time.  Had recommended knee-high compression stockings for her chronic venous insufficiency but she states that she has not gotten these either. ? ?Assessment & Plan: ?Visit Diagnoses:  ?1. Pain in right ankle and joints of right foot   ?2. Pain in left ankle and joints of left foot   ?3. Chronic pain of left knee   ? ? ?Plan: Recommended a size extra-large knee-high compression stocking recommended exercise and elevation to further help with the swelling.  Reevaluate in 2 months for possible injection of the left knee. ? ?Follow-Up Instructions: Return in about 2 months (around 05/18/2021).  ? ?Ortho Exam ? ?Patient is alert, oriented, no adenopathy, well-dressed, normal affect, normal respiratory effort. ?Examination patient has crepitation with pain with range of motion of the left knee collaterals and cruciates are stable there is no effusion radiographs show tricompartmental arthritic changes of the left knee.  Radiographs of both ankles shows a congruent joint she does have venous stasis swelling involving the foot ankle and leg.  There is no crepitation or pain with range of motion of the ankle there is no redness or cellulitis. ? ?Imaging: ?No results found. ?No images are attached to the encounter. ? ?Labs: ?Lab Results   ?Component Value Date  ? HGBA1C 5.5 06/24/2013  ? HGBA1C 5.4 02/20/2013  ? HGBA1C 5.5 10/09/2012  ? REPTSTATUS 06/02/2011 FINAL 05/31/2011  ? CULT No Herpes Simplex Virus detected. 05/31/2011  ? [place] 01/18/2010  ? ? ? ?Lab Results  ?Component Value Date  ? ALBUMIN 4.3 07/28/2009  ? ALBUMIN 3.9 11/25/2008  ? ALBUMIN 4.6 06/07/2007  ? ? ?No results found for: MG ?No results found for: VD25OH ? ?No results found for: PREALBUMIN ?CBC EXTENDED Latest Ref Rng & Units 10/20/2020 02/10/2016 07/29/2013  ?WBC 3.8 - 10.8 Thousand/uL 6.3 5.0 5.4  ?RBC 3.80 - 5.10 Million/uL 4.63 4.49 4.35  ?HGB 11.7 - 15.5 g/dL 12.5 12.9 12.4  ?HCT 35.0 - 45.0 % 39.5 39.7 38.6  ?PLT 140 - 400 Thousand/uL 244 191 219  ?NEUTROABS 1.7 - 7.7 K/uL - - -  ?LYMPHSABS 0.7 - 4.0 K/uL - - -  ? ? ? ?There is no height or weight on file to calculate BMI. ? ?Orders:  ?No orders of the defined types were placed in this encounter. ? ?No orders of the defined types were placed in this encounter. ? ? ? Procedures: ?No procedures performed ? ?Clinical Data: ?No additional findings. ? ?ROS: ? ?All other systems negative, except as noted in the HPI. ?Review of Systems ? ?Objective: ?Vital Signs: There were no vitals taken  for this visit. ? ?Specialty Comments:  ?No specialty comments available. ? ?PMFS History: ?Patient Active Problem List  ? Diagnosis Date Noted  ? Chronic pain of left knee 06/29/2016  ? Cervical spine pain 05/26/2016  ? Lipoma of right upper extremity 05/26/2016  ? Spondylolysis, cervical region 12/03/2015  ? Vaginitis 06/24/2013  ? Stressful life event affecting family 02/20/2013  ? DUB (dysfunctional uterine bleeding) 12/05/2011  ? Varicose veins of lower extremities with other complications 29/56/2130  ? Preventative health care 08/11/2010  ? PREDIABETES 08/17/2009  ? Unilateral primary osteoarthritis, left knee 12/28/2005  ? TOBACCO ABUSE 12/01/2005  ? HYPERTENSION 12/01/2005  ? ?Past Medical History:  ?Diagnosis Date  ?  Bilateral leg edema   ? DVT of lower extremity (deep venous thrombosis) ([facility])   ? History of recurrent UTIs   ? Hypertension   ? Knee pain   ? Left knee DJD 2002  ? MRI done in 2002  ? Obesity   ? Smoking   ? Varicose vein   ?  ?Family History  ?Problem Relation Age of Onset  ? Cancer Mother   ?     LUNG  ? Anesthesia problems Neg Hx   ?  ?Past Surgical History:  ?Procedure Laterality Date  ? KNEE ARTHROSCOPY    ? TUBAL LIGATION    ? ?Social History  ? ?Occupational History  ? Not on file  ?Tobacco Use  ? Smoking status: Every Day  ?  Packs/day: 1.00  ?  Years: 20.00  ?  Pack years: 20.00  ?  Types: Cigarettes  ? Smokeless tobacco: Never  ? Tobacco comments:  ?  1/2 PPD  ?Vaping Use  ? Vaping Use: Never used  ?Substance and Sexual Activity  ? Alcohol use: Yes  ?  Comment: "every now and then, not on a regular basis   ? Drug use: No  ? Sexual activity: Yes  ?  Partners: Male  ?  Birth control/protection: None  ? ? ? ? ? ?

## 2021-03-26 ENCOUNTER — Telehealth: Payer: Self-pay | Admitting: Orthopedic Surgery

## 2021-03-26 NOTE — Telephone Encounter (Signed)
Pt called and was wondering if her paperwork for [facility] has been mailed off yet to her?  ? ?CB [phone] ?

## 2021-03-26 NOTE — Telephone Encounter (Signed)
Pt informed paper pending Dr. Jess Barters signature and will be mailed out on Monday for her to have on time to send off to [facility] ?

## 2021-04-06 NOTE — Telephone Encounter (Signed)
Pt called and is wondering if this has been done.  ? ?CB [phone] ?

## 2021-04-06 NOTE — Telephone Encounter (Signed)
Pt informed that paperwork was mailed to her last week. ?

## 2021-04-27 ENCOUNTER — Ambulatory Visit: Payer: Self-pay

## 2021-04-27 ENCOUNTER — Ambulatory Visit (INDEPENDENT_AMBULATORY_CARE_PROVIDER_SITE_OTHER): Payer: Medicare Other | Admitting: Family

## 2021-04-27 ENCOUNTER — Encounter: Payer: Self-pay | Admitting: Family

## 2021-04-27 DIAGNOSIS — M25571 Pain in right ankle and joints of right foot: Secondary | ICD-10-CM

## 2021-04-27 NOTE — Progress Notes (Signed)
? ?Office Visit Note ?  ?Patient: April Mathis           ?Date of Birth: 1950/06/23           ?MRN: [id number] ?Visit Date: 04/27/2021 ?             ?Requested by: Nolene Ebbs, MD ?[address] ?[place],  [place] [address] ?PCP: Nolene Ebbs, MD ? ?Chief Complaint  ?Patient presents with  ? Right Ankle - Pain  ? ? ? ? ?HPI: ?The patient is a 71 year old woman who presents today complaining of right ankle pain.  This is a chronic problem for her she states that her ankle pain prevents her from walking prolonged distances she is having difficulty with her activities of daily living and transportation she is requesting that we assist with her [place] access paperwork.  She also reports she was crossing the street earlier this week when she had a fall twisting her right ankle and worsening her chronic ankle pain she was requesting radiographs to check for fracture or injury to the right ankle. ? ?States that she has been unable to get transportation over to the medical supply store to purchase compression garments ? ?Assessment & Plan: ?Visit Diagnoses:  ?1. Pain in right ankle and joints of right foot   ? ? ?Plan: Radiographs performed.  These were reassuring.  Discussed the importance of compression and elevation do not have any compression garments in the office today to offer her will provide her with her [place] access paperwork today.  She will follow-up as needed. ? ?Declined to send in Narcotics for her chronic pain. ? ?Ambulated to lobby without assistive device with steady gait. ? ?Follow-Up Instructions: No follow-ups on file.  ? ?Right Ankle Exam  ? ?Tenderness  ?Right ankle tenderness location: Anterior joint line. ?Swelling: mild ? ?Range of Motion  ?The patient has normal right ankle ROM. ? ?Other  ?Erythema: absent ?Sensation: none ?Pulse: present  ? ? ? ? ?Patient is alert, oriented, no adenopathy, well-dressed, normal affect, normal respiratory effort. ? ?Imaging: ?No results found. ?No images are  attached to the encounter. ? ?Labs: ?Lab Results  ?Component Value Date  ? HGBA1C 5.5 06/24/2013  ? HGBA1C 5.4 02/20/2013  ? HGBA1C 5.5 10/09/2012  ? REPTSTATUS 06/02/2011 FINAL 05/31/2011  ? CULT No Herpes Simplex Virus detected. 05/31/2011  ? [place] 01/18/2010  ? ? ? ?Lab Results  ?Component Value Date  ? ALBUMIN 4.3 07/28/2009  ? ALBUMIN 3.9 11/25/2008  ? ALBUMIN 4.6 06/07/2007  ? ? ?No results found for: MG ?No results found for: VD25OH ? ?No results found for: PREALBUMIN ? ?  Latest Ref Rng & Units 10/20/2020  ? 12:00 AM 02/10/2016  ? 12:01 PM 07/29/2013  ?  7:58 PM  ?CBC EXTENDED  ?WBC 3.8 - 10.8 Thousand/uL 6.3   5.0   5.4    ?RBC 3.80 - 5.10 Million/uL 4.63   4.49   4.35    ?Hemoglobin 11.7 - 15.5 g/dL 12.5   12.9   12.4    ?HCT 35.0 - 45.0 % 39.5   39.7   38.6    ?Platelets 140 - 400 Thousand/uL 244   191   219    ? ? ? ?There is no height or weight on file to calculate BMI. ? ?Orders:  ?Orders Placed This Encounter  ?Procedures  ? XR Ankle Complete Right  ? ?No orders of the defined types were placed in this encounter. ? ? ?  Procedures: ?No procedures performed ? ?Clinical Data: ?No additional findings. ? ?ROS: ? ?All other systems negative, except as noted in the HPI. ?Review of Systems  ?Constitutional:  Negative for chills and fever.  ?Cardiovascular:  Positive for leg swelling.  ?Musculoskeletal:  Positive for arthralgias, gait problem, joint swelling and myalgias.  ? ?Objective: ?Vital Signs: There were no vitals taken for this visit. ? ?Specialty Comments:  ?No specialty comments available. ? ?PMFS History: ?Patient Active Problem List  ? Diagnosis Date Noted  ? Chronic pain of left knee 06/29/2016  ? Cervical spine pain 05/26/2016  ? Lipoma of right upper extremity 05/26/2016  ? Spondylolysis, cervical region 12/03/2015  ? Vaginitis 06/24/2013  ? Stressful life event affecting family 02/20/2013  ? DUB (dysfunctional uterine bleeding) 12/05/2011  ? Varicose veins of lower extremities  with other complications 03/55/9741  ? Preventative health care 08/11/2010  ? PREDIABETES 08/17/2009  ? Unilateral primary osteoarthritis, left knee 12/28/2005  ? TOBACCO ABUSE 12/01/2005  ? HYPERTENSION 12/01/2005  ? ?Past Medical History:  ?Diagnosis Date  ? Bilateral leg edema   ? DVT of lower extremity (deep venous thrombosis) ([place])   ? History of recurrent UTIs   ? Hypertension   ? Knee pain   ? Left knee DJD 2002  ? MRI done in 2002  ? Obesity   ? Smoking   ? Varicose vein   ?  ?Family History  ?Problem Relation Age of Onset  ? Cancer Mother   ?     LUNG  ? Anesthesia problems Neg Hx   ?  ?Past Surgical History:  ?Procedure Laterality Date  ? KNEE ARTHROSCOPY    ? TUBAL LIGATION    ? ?Social History  ? ?Occupational History  ? Not on file  ?Tobacco Use  ? Smoking status: Every Day  ?  Packs/day: 1.00  ?  Years: 20.00  ?  Pack years: 20.00  ?  Types: Cigarettes  ? Smokeless tobacco: Never  ? Tobacco comments:  ?  1/2 PPD  ?Vaping Use  ? Vaping Use: Never used  ?Substance and Sexual Activity  ? Alcohol use: Yes  ?  Comment: "every now and then, not on a regular basis   ? Drug use: No  ? Sexual activity: Yes  ?  Partners: Male  ?  Birth control/protection: None  ? ? ? ? ? ?

## 2021-05-03 ENCOUNTER — Ambulatory Visit: Payer: Medicare Other | Admitting: Orthopedic Surgery

## 2021-05-06 ENCOUNTER — Ambulatory Visit (INDEPENDENT_AMBULATORY_CARE_PROVIDER_SITE_OTHER): Payer: Medicare Other | Admitting: Orthopedic Surgery

## 2021-05-06 DIAGNOSIS — M25571 Pain in right ankle and joints of right foot: Secondary | ICD-10-CM | POA: Diagnosis not present

## 2021-05-20 ENCOUNTER — Ambulatory Visit: Payer: Medicare Other | Admitting: Orthopedic Surgery

## 2021-05-23 ENCOUNTER — Encounter: Payer: Self-pay | Admitting: Orthopedic Surgery

## 2021-05-23 NOTE — Progress Notes (Signed)
? ?Office Visit Note ?  ?Patient: April Mathis           ?Date of Birth: [birth year]           ?MRN: [id number] ?Visit Date: 05/06/2021 ?             ?Requested by: Nolene Ebbs, MD ?[address] ?[place],  [place] [address] ?PCP: Nolene Ebbs, MD ? ?Chief Complaint  ?Patient presents with  ? Right Ankle - Pain  ? ? ? ? ?HPI: ?Patient is a 71 year old woman who presents for evaluation for right ankle pain.  She states she had a fall a couple weeks ago twisting her ankle.  Patient states her pain is primarily over the medial malleolus. ? ?Assessment & Plan: ?Visit Diagnoses:  ?1. Pain in right ankle and joints of right foot   ? ? ?Plan: Patient states she is currently doing better with compression socks.  Recommended topical Voltaren gel for triggering of the right thumb. ? ?Follow-Up Instructions: Return if symptoms worsen or fail to improve.  ? ?Ortho Exam ? ?Patient is alert, oriented, no adenopathy, well-dressed, normal affect, normal respiratory effort. ?Examination patient has pain to palpation over the deltoid.  Anterior drawer is stable anterior talofibular ligament is nontender to palpation the posterior tibial tendon is not tender to palpation.  She does have some palpable triggering of the right thumb at the A1 pulley. ? ?Imaging: ?No results found. ?No images are attached to the encounter. ? ?Labs: ?Lab Results  ?Component Value Date  ? HGBA1C 5.5 06/24/2013  ? HGBA1C 5.4 02/20/2013  ? HGBA1C 5.5 10/09/2012  ? REPTSTATUS 06/02/2011 FINAL 05/31/2011  ? CULT No Herpes Simplex Virus detected. 05/31/2011  ? [place] 01/18/2010  ? ? ? ?Lab Results  ?Component Value Date  ? ALBUMIN 4.3 07/28/2009  ? ALBUMIN 3.9 11/25/2008  ? ALBUMIN 4.6 06/07/2007  ? ? ?No results found for: MG ?No results found for: VD25OH ? ?No results found for: PREALBUMIN ? ?  Latest Ref Rng & Units 10/20/2020  ? 12:00 AM 02/10/2016  ? 12:01 PM 07/29/2013  ?  7:58 PM  ?CBC EXTENDED  ?WBC 3.8 - 10.8 Thousand/uL 6.3   5.0    5.4    ?RBC 3.80 - 5.10 Million/uL 4.63   4.49   4.35    ?Hemoglobin 11.7 - 15.5 g/dL 12.5   12.9   12.4    ?HCT 35.0 - 45.0 % 39.5   39.7   38.6    ?Platelets 140 - 400 Thousand/uL 244   191   219    ? ? ? ?There is no height or weight on file to calculate BMI. ? ?Orders:  ?No orders of the defined types were placed in this encounter. ? ?No orders of the defined types were placed in this encounter. ? ? ? Procedures: ?No procedures performed ? ?Clinical Data: ?No additional findings. ? ?ROS: ? ?All other systems negative, except as noted in the HPI. ?Review of Systems ? ?Objective: ?Vital Signs: There were no vitals taken for this visit. ? ?Specialty Comments:  ?No specialty comments available. ? ?PMFS History: ?Patient Active Problem List  ? Diagnosis Date Noted  ? Chronic pain of left knee 06/29/2016  ? Cervical spine pain 05/26/2016  ? Lipoma of right upper extremity 05/26/2016  ? Spondylolysis, cervical region 12/03/2015  ? Vaginitis 06/24/2013  ? Stressful life event affecting family 02/20/2013  ? DUB (dysfunctional uterine bleeding) 12/05/2011  ? Varicose veins of lower extremities  with other complications 40/81/4481  ? Preventative health care 08/11/2010  ? PREDIABETES 08/17/2009  ? Unilateral primary osteoarthritis, left knee 12/28/2005  ? TOBACCO ABUSE 12/01/2005  ? HYPERTENSION 12/01/2005  ? ?Past Medical History:  ?Diagnosis Date  ? Bilateral leg edema   ? DVT of lower extremity (deep venous thrombosis) ([place])   ? History of recurrent UTIs   ? Hypertension   ? Knee pain   ? Left knee DJD 2002  ? MRI done in 2002  ? Obesity   ? Smoking   ? Varicose vein   ?  ?Family History  ?Problem Relation Age of Onset  ? Cancer Mother   ?     LUNG  ? Anesthesia problems Neg Hx   ?  ?Past Surgical History:  ?Procedure Laterality Date  ? KNEE ARTHROSCOPY    ? TUBAL LIGATION    ? ?Social History  ? ?Occupational History  ? Not on file  ?Tobacco Use  ? Smoking status: Every Day  ?  Packs/day: 1.00  ?  Years: 20.00  ?  Pack  years: 20.00  ?  Types: Cigarettes  ? Smokeless tobacco: Never  ? Tobacco comments:  ?  1/2 PPD  ?Vaping Use  ? Vaping Use: Never used  ?Substance and Sexual Activity  ? Alcohol use: Yes  ?  Comment: "every now and then, not on a regular basis   ? Drug use: No  ? Sexual activity: Yes  ?  Partners: Male  ?  Birth control/protection: None  ? ? ? ? ? ?

## 2021-06-24 ENCOUNTER — Ambulatory Visit: Payer: Medicare Other | Admitting: Orthopedic Surgery

## 2021-07-02 ENCOUNTER — Ambulatory Visit (INDEPENDENT_AMBULATORY_CARE_PROVIDER_SITE_OTHER): Payer: Medicare Other

## 2021-07-02 ENCOUNTER — Other Ambulatory Visit (HOSPITAL_COMMUNITY)
Admission: RE | Admit: 2021-07-02 | Discharge: 2021-07-02 | Disposition: A | Discharge status: Home / Self Care | Payer: Medicare Other | Source: Ambulatory Visit | Attending: Obstetrics and Gynecology | Admitting: Obstetrics and Gynecology

## 2021-07-02 DIAGNOSIS — N898 Other specified noninflammatory disorders of vagina: Secondary | ICD-10-CM

## 2021-07-02 NOTE — Progress Notes (Signed)
Agree with nurses's documentation of this patient's clinic encounter.  Jeramyah Goodpasture L, MD  

## 2021-07-02 NOTE — Progress Notes (Signed)
..  SUBJECTIVE:  71 y.o. female complains of  vaginal discharge and odor for 4 day(s). Denies abnormal vaginal bleeding or significant pelvic pain or fever. No UTI symptoms. Denies history of known exposure to STD.  No LMP recorded. Patient is postmenopausal.  OBJECTIVE:  She appears well, afebrile. Urine dipstick: not done.  ASSESSMENT:  Vaginal Discharge  Vaginal Odor   PLAN:  GC, chlamydia, trichomonas, BVAG, CVAG probe sent to lab. Treatment: To be determined once lab results are received ROV prn if symptoms persist or worsen.

## 2021-07-04 LAB — CERVICOVAGINAL ANCILLARY ONLY
Bacterial Vaginitis (gardnerella): NEGATIVE
Candida Glabrata: NEGATIVE
Candida Vaginitis: POSITIVE — AB
Chlamydia: NEGATIVE
Comment: NEGATIVE
Comment: NEGATIVE
Comment: NEGATIVE
Comment: NEGATIVE
Comment: NEGATIVE
Comment: NORMAL
Neisseria Gonorrhea: NEGATIVE
Trichomonas: NEGATIVE

## 2021-07-06 ENCOUNTER — Other Ambulatory Visit: Payer: Self-pay

## 2021-07-06 DIAGNOSIS — B379 Candidiasis, unspecified: Secondary | ICD-10-CM

## 2021-07-06 MED ORDER — FLUCONAZOLE 150 MG PO TABS: 150.0000 mg | ORAL_TABLET | Freq: Once | ORAL | 0 refills | Status: DC

## 2021-07-08 ENCOUNTER — Other Ambulatory Visit: Payer: Self-pay | Admitting: Emergency Medicine

## 2021-07-08 DIAGNOSIS — B379 Candidiasis, unspecified: Secondary | ICD-10-CM

## 2021-07-08 MED ORDER — TERCONAZOLE 0.4 % VA CREA: 1.0000 | TOPICAL_CREAM | Freq: Every day | VAGINAL | 0 refills | Status: DC

## 2021-07-22 ENCOUNTER — Ambulatory Visit: Payer: Medicare Other | Admitting: Obstetrics and Gynecology

## 2021-07-29 ENCOUNTER — Other Ambulatory Visit: Payer: Self-pay

## 2021-07-29 ENCOUNTER — Telehealth: Payer: Self-pay

## 2021-07-29 DIAGNOSIS — B379 Candidiasis, unspecified: Secondary | ICD-10-CM

## 2021-07-29 MED ORDER — TERCONAZOLE 0.4 % VA CREA: 1.0000 | TOPICAL_CREAM | Freq: Every day | VAGINAL | 0 refills | Status: AC

## 2021-07-29 NOTE — Progress Notes (Signed)
Pt called reporting vaginal irritation and requesting refill on yeast cream until appointment on 7/31

## 2021-07-29 NOTE — Telephone Encounter (Signed)
Returned call and states that she spoke with urology and they assisted her. Next appt in our office is 7/31.

## 2021-08-16 ENCOUNTER — Other Ambulatory Visit (HOSPITAL_COMMUNITY)
Admission: RE | Admit: 2021-08-16 | Discharge: 2021-08-16 | Disposition: A | Discharge status: Home / Self Care | Payer: Medicare Other | Source: Ambulatory Visit | Attending: Obstetrics and Gynecology | Admitting: Obstetrics and Gynecology

## 2021-08-16 ENCOUNTER — Encounter: Payer: Self-pay | Admitting: Obstetrics & Gynecology

## 2021-08-16 ENCOUNTER — Ambulatory Visit (INDEPENDENT_AMBULATORY_CARE_PROVIDER_SITE_OTHER): Payer: Medicare Other | Admitting: Obstetrics & Gynecology

## 2021-08-16 VITALS — BP 157/81 | HR 74 | Ht 62.0 in | Wt 192.0 lb

## 2021-08-16 DIAGNOSIS — N760 Acute vaginitis: Secondary | ICD-10-CM

## 2021-08-16 DIAGNOSIS — B3731 Acute candidiasis of vulva and vagina: Secondary | ICD-10-CM

## 2021-08-16 NOTE — Progress Notes (Signed)
   GYNECOLOGY OFFICE VISIT NOTE  History:   April Mathis is a 71 y.o. [phone] here today for evaluation of vulvovaginal irritation for the past few weeks.  Reports burning sensation inside vagina and a little on outside. No lesions. Sexually active, wants to be evaluated for STIs and HSV although she does not have a history of this.  She denies any abnormal vaginal discharge, bleeding, pelvic pain or other concerns.    Past Medical History:  Diagnosis Date   Bilateral leg edema    DVT of lower extremity (deep venous thrombosis) ([place])    History of recurrent UTIs    Hypertension    Knee pain    Left knee DJD 2002   MRI done in 2002   Obesity    Smoking    Varicose vein     Past Surgical History:  Procedure Laterality Date   KNEE ARTHROSCOPY     TUBAL LIGATION      The following portions of the patient's history were reviewed and updated as appropriate: allergies, current medications, past family history, past medical history, past social history, past surgical history and problem list.   Health Maintenance:  Normal mamm[facility] earlier this year [facility]  Review of Systems:  Pertinent items noted in HPI and remainder of comprehensive ROS otherwise negative.  Physical Exam:  BP (!) 157/81   Pulse 74   Ht '5\' 2"'$  (1.575 m)   Wt 192 lb (87.1 kg)   BMI 35.12 kg/m  CONSTITUTIONAL: Well-developed, well-nourished female in no acute distress.  HEENT:  Normocephalic, atraumatic. External right and left ear normal. No scleral icterus.  NECK: Normal range of motion, supple, no masses noted on observation SKIN: No rash noted. Not diaphoretic. No erythema. No pallor. MUSCULOSKELETAL: Normal range of motion. No edema noted. NEUROLOGIC: Alert and oriented to person, place, and time. Normal muscle tone coordination. No cranial nerve deficit noted. PSYCHIATRIC: Normal mood and affect. Normal behavior. Normal judgment and thought content. CARDIOVASCULAR: Normal heart rate noted RESPIRATORY:  Effort and breath sounds normal, no problems with respiration noted ABDOMEN: No masses noted. No other overt distention noted.   PELVIC: External genitalia with moderate atrophy otherwise normal; atrophic urethral meatus; atrophic vaginal mucosa and cervix.  Scant clear discharge noted, testing sample obtained. No lesions seen.  No tenderness on examination.  Performed in the presence of a chaperone     Assessment and Plan:    1. Vulvovaginitis Patient reassured by absence of lesions or tenderness, no reason to do HSV test. Regular vaginitis testing done, will follow up results and manage accordingly. Proper vulvar hygiene emphasized.  - Cervicovaginal ancillary only( [place])  Routine preventative health maintenance measures emphasized. Please refer to After Visit Summary for other counseling recommendations.   Return for any gynecologic concerns.    I spent 20 minutes dedicated to the care of this patient including pre-visit review of records, face to face time with the patient discussing her conditions and treatments and post visit orders.    Verita Schneiders, MD, [place] for [employer], [place]

## 2021-08-16 NOTE — Progress Notes (Signed)
71 y.o c/o itching and burning inside her cervix, and vaginal discharge.

## 2021-08-18 LAB — CERVICOVAGINAL ANCILLARY ONLY
Bacterial Vaginitis (gardnerella): NEGATIVE
Candida Glabrata: NEGATIVE
Candida Vaginitis: POSITIVE — AB
Chlamydia: NEGATIVE
Comment: NEGATIVE
Comment: NEGATIVE
Comment: NEGATIVE
Comment: NEGATIVE
Comment: NEGATIVE
Comment: NORMAL
Neisseria Gonorrhea: NEGATIVE
Trichomonas: NEGATIVE

## 2021-08-19 MED ORDER — FLUCONAZOLE 150 MG PO TABS: 150.0000 mg | ORAL_TABLET | Freq: Once | ORAL | 3 refills | Status: AC

## 2021-08-19 NOTE — Addendum Note (Signed)
Addended by: Verita Schneiders A on: 08/19/2021 09:53 AM   Modules accepted: Orders

## 2021-08-20 ENCOUNTER — Telehealth: Payer: Self-pay

## 2021-08-20 NOTE — Telephone Encounter (Signed)
S/w pt and advised of results and rx sent. 

## 2021-10-08 ENCOUNTER — Encounter: Payer: Self-pay | Admitting: Obstetrics and Gynecology

## 2021-10-08 ENCOUNTER — Other Ambulatory Visit (HOSPITAL_COMMUNITY)
Admission: RE | Admit: 2021-10-08 | Discharge: 2021-10-08 | Disposition: A | Discharge status: Home / Self Care | Payer: Medicare Other | Source: Ambulatory Visit | Attending: Obstetrics and Gynecology | Admitting: Obstetrics and Gynecology

## 2021-10-08 ENCOUNTER — Ambulatory Visit (INDEPENDENT_AMBULATORY_CARE_PROVIDER_SITE_OTHER): Payer: Medicare Other | Admitting: Obstetrics and Gynecology

## 2021-10-08 VITALS — BP 167/80 | HR 71 | Ht 62.0 in | Wt 196.2 lb

## 2021-10-08 DIAGNOSIS — Z01419 Encounter for gynecological examination (general) (routine) without abnormal findings: Secondary | ICD-10-CM | POA: Diagnosis present

## 2021-10-08 LAB — POCT URINALYSIS DIPSTICK
Bilirubin, UA: NEGATIVE
Glucose, UA: NEGATIVE
Ketones, UA: NEGATIVE
Nitrite, UA: NEGATIVE
Protein, UA: NEGATIVE
Spec Grav, UA: 1.015 (ref 1.010–1.025)
Urobilinogen, UA: 0.2 E.U./dL
pH, UA: 6 (ref 5.0–8.0)

## 2021-10-08 NOTE — Progress Notes (Signed)
Subjective:     April Mathis is a 71 y.o. female P4 postmenopausal with BMI 35 who is here for a comprehensive physical exam. The patient reports some dysuria and vaginal pruritis. Patient was recently treated for a yeast infection. She denies any episodes of postmenopausal vaginal bleeding. She is sexually active with complaints. She denies any urinary incontinence. She denies any pelvic pain. Patient desires full STI testing  Past Medical History:  Diagnosis Date   Bilateral leg edema    DVT of lower extremity (deep venous thrombosis) ([facility])    History of recurrent UTIs    Hypertension    Knee pain    Left knee DJD 2002   MRI done in 2002   Obesity    Smoking    Varicose vein    Past Surgical History:  Procedure Laterality Date   KNEE ARTHROSCOPY     TUBAL LIGATION     Family History  Problem Relation Age of Onset   Cancer Mother        LUNG   Anesthesia problems Neg Hx     Social History   Socioeconomic History   Marital status: Divorced    Spouse name: Not on file   Number of children: Not on file   Years of education: Not on file   Highest education level: Not on file  Occupational History   Not on file  Tobacco Use   Smoking status: Every Day    Packs/day: 1.00    Years: 20.00    Total pack years: 20.00    Types: Cigarettes   Smokeless tobacco: Never   Tobacco comments:    1/2 PPD  Vaping Use   Vaping Use: Never used  Substance and Sexual Activity   Alcohol use: Yes    Comment: "every now and then, not on a regular basis    Drug use: No   Sexual activity: Yes    Partners: Male    Birth control/protection: None  Other Topics Concern   Not on file  Social History Narrative   Not on file   Social Determinants of Health   Financial Resource Strain: Not on file  Food Insecurity: Not on file  Transportation Needs: Not on file  Physical Activity: Not on file  Stress: Not on file  Social Connections: Not on file  Intimate Partner Violence: Not on  file   Health Maintenance  Topic Date Due   COVID-19 Vaccine (1) Never done   Pneumonia Vaccine 44+ Years old (1 - PCV) Never done   COLONOSCOPY (Pts 45-6yr Insurance coverage will need to be confirmed)  Never done   Zoster Vaccines- Shingrix (1 of 2) Never done   DEXA SCAN  Never done   TETANUS/TDAP  07/24/2019   INFLUENZA VACCINE  08/17/2021   MAMMOGRAM  03/09/2023   Hepatitis C Screening  Completed   HPV VACCINES  Aged Out       Review of Systems Pertinent items noted in HPI and remainder of comprehensive ROS otherwise negative.   Objective:  Blood pressure (!) 167/80, pulse 71, height '5\' 2"'$  (1.575 m), weight 196 lb 3.2 oz (89 kg).   GENERAL: Well-developed, well-nourished female in no acute distress.  HEENT: Normocephalic, atraumatic. Sclerae anicteric.  NECK: Supple. Normal thyroid.  LUNGS: Clear to auscultation bilaterally.  HEART: Regular rate and rhythm. BREASTS: Symmetric in size. No palpable masses or lymphadenopathy, skin changes, or nipple drainage. ABDOMEN: Soft, nontender, nondistended. No organomegaly. PELVIC: Normal external female genitalia. Vagina is  pink and rugated.  Normal discharge. Normal appearing cervix. Uterus is normal in size. No adnexal mass or tenderness. Chaperone present during the pelvic exam EXTREMITIES: No cyanosis, clubbing, or edema, 2+ distal pulses.     Assessment:    Healthy female exam.      Plan:    Urine culture collected Vaginal swab collected Reviewed vulva care to decrease recurrence of yeast infections Patient current on mammogram Patient had Cologuard done last year Patient will be contacted with abnormal results See After Visit Summary for Counseling Recommendations

## 2021-10-08 NOTE — Progress Notes (Signed)
Patient presents for annual exam. Pt reports having brown discharge and vaginal burning/ itching. Also reports burning upon urination. Would like STD testing and PAP done today. No other concerns at this time.

## 2021-10-09 LAB — HEPATITIS C ANTIBODY: Hep C Virus Ab: NONREACTIVE

## 2021-10-09 LAB — HEPATITIS B SURFACE ANTIGEN: Hepatitis B Surface Ag: NEGATIVE

## 2021-10-09 LAB — HIV ANTIBODY (ROUTINE TESTING W REFLEX): HIV Screen 4th Generation wRfx: NONREACTIVE

## 2021-10-09 LAB — RPR: RPR Ser Ql: NONREACTIVE

## 2021-10-10 LAB — CERVICOVAGINAL ANCILLARY ONLY
Bacterial Vaginitis (gardnerella): NEGATIVE
Candida Glabrata: NEGATIVE
Candida Vaginitis: NEGATIVE
Comment: NEGATIVE
Comment: NEGATIVE
Comment: NEGATIVE

## 2021-10-13 LAB — URINE CULTURE

## 2021-10-15 ENCOUNTER — Telehealth: Payer: Self-pay

## 2021-10-15 ENCOUNTER — Telehealth: Payer: Self-pay | Admitting: General Practice

## 2021-10-15 MED ORDER — NITROFURANTOIN MONOHYD MACRO 100 MG PO CAPS: 100.0000 mg | ORAL_CAPSULE | Freq: Two times a day (BID) | ORAL | 0 refills | Status: AC

## 2021-10-15 NOTE — Telephone Encounter (Signed)
-----   Message from Mora Bellman, MD sent at 10/15/2021  9:14 AM EDT ----- Please inform patient of UTI- Rx has been e-prescribed

## 2021-10-15 NOTE — Telephone Encounter (Signed)
Pt informed of results. No further questions at this time.

## 2021-10-15 NOTE — Addendum Note (Signed)
Addended by: Mora Bellman on: 10/15/2021 09:15 AM   Modules accepted: Orders

## 2021-12-01 ENCOUNTER — Encounter: Payer: Self-pay | Admitting: Obstetrics

## 2022-02-16 ENCOUNTER — Other Ambulatory Visit: Payer: Self-pay | Admitting: Obstetrics

## 2022-02-16 DIAGNOSIS — R238 Other skin changes: Secondary | ICD-10-CM

## 2022-02-16 MED ORDER — SELENIUM SULFIDE 2.5 % EX LOTN: 1.0000 | TOPICAL_LOTION | Freq: Every day | CUTANEOUS | 6 refills | Status: AC | PRN

## 2022-02-17 ENCOUNTER — Telehealth (INDEPENDENT_AMBULATORY_CARE_PROVIDER_SITE_OTHER): Payer: 59 | Admitting: Obstetrics

## 2022-02-17 ENCOUNTER — Encounter: Payer: Self-pay | Admitting: Obstetrics

## 2022-02-17 DIAGNOSIS — Z719 Counseling, unspecified: Secondary | ICD-10-CM

## 2022-02-17 DIAGNOSIS — Z1239 Encounter for other screening for malignant neoplasm of breast: Secondary | ICD-10-CM

## 2022-02-17 NOTE — Progress Notes (Signed)
Pt has concerns about continuing PAP due to history of abnormal PAP smear. Pt requesting refill of medicated shampoo.

## 2022-02-17 NOTE — Progress Notes (Signed)
   TELEHEALTH GYNECOLOGY VISIT ENCOUNTER NOTE  Provider location: Center for [place] [facility]   Patient location: Home  I connected with April Mathis on 02/17/22 at  2:30 PM EST by telephone and verified that I am speaking with the correct person using two identifiers. Patient was unable to do MyChart audiovisual encounter due to technical difficulties, she tried several times.    I discussed the limitations, risks, security and privacy concerns of performing an evaluation and management service by telephone and the availability of in person appointments. I also discussed with the patient that there may be a patient responsible charge related to this service. The patient expressed understanding and agreed to proceed.   History:  April Mathis is a 72 y.o. [phone] female being evaluated today for pap smear results. She denies any abnormal vaginal discharge, bleeding, pelvic pain or other concerns.       Past Medical History:  Diagnosis Date   Bilateral leg edema    DVT of lower extremity (deep venous thrombosis) ([place])    History of recurrent UTIs    Hypertension    Knee pain    Left knee DJD 2002   MRI done in 2002   Obesity    Smoking    Varicose vein    Past Surgical History:  Procedure Laterality Date   KNEE ARTHROSCOPY     TUBAL LIGATION     The following portions of the patient's history were reviewed and updated as appropriate: allergies, current medications, past family history, past medical history, past social history, past surgical history and problem list.   Health Maintenance:  Normal pap and negative HRHPV on 09-30-2020.  Normal mammogram in 2014.   Review of Systems:  Pertinent items noted in HPI and remainder of comprehensive ROS otherwise negative.  Physical Exam:   General:  Alert, oriented and cooperative.   Mental Status: Normal mood and affect perceived. Normal judgment and thought content.  Physical exam deferred due to nature of the  encounter  Labs and Imaging No results found for this or any previous visit (from the past 336 hour(s)). No results found.    Assessment and Plan:        1. Encounter for consultation - has not had pap smear - wants to schedule Annual Gyn Exam  2. Screening breast examination - MM Digital Screening; Future    I discussed the assessment and treatment plan with the patient. The patient was provided an opportunity to ask questions and all were answered. The patient agreed with the plan and demonstrated an understanding of the instructions.   The patient was advised to call back or seek an in-person evaluation/go to the ED if the symptoms worsen or if the condition fails to improve as anticipated.  I have spent a total of 10 minutes of non-face-to-face time, excluding clinical staff time, reviewing notes and preparing to see patient, ordering tests and/or medications, and counseling the patient.    Baltazar Najjar, MD Center for [facility], [place], [facility] 02/17/2022

## 2022-03-16 ENCOUNTER — Encounter: Payer: Self-pay | Admitting: Obstetrics

## 2022-03-16 ENCOUNTER — Ambulatory Visit (INDEPENDENT_AMBULATORY_CARE_PROVIDER_SITE_OTHER): Payer: 59 | Admitting: Obstetrics

## 2022-03-16 ENCOUNTER — Other Ambulatory Visit (HOSPITAL_COMMUNITY)
Admission: RE | Admit: 2022-03-16 | Discharge: 2022-03-16 | Disposition: A | Discharge status: Home / Self Care | Payer: 59 | Source: Ambulatory Visit | Attending: Obstetrics | Admitting: Obstetrics

## 2022-03-16 VITALS — Ht 62.0 in | Wt 205.6 lb

## 2022-03-16 DIAGNOSIS — Z01419 Encounter for gynecological examination (general) (routine) without abnormal findings: Secondary | ICD-10-CM | POA: Insufficient documentation

## 2022-03-16 DIAGNOSIS — I10 Essential (primary) hypertension: Secondary | ICD-10-CM

## 2022-03-16 DIAGNOSIS — N898 Other specified noninflammatory disorders of vagina: Secondary | ICD-10-CM | POA: Insufficient documentation

## 2022-03-16 DIAGNOSIS — Z113 Encounter for screening for infections with a predominantly sexual mode of transmission: Secondary | ICD-10-CM

## 2022-03-16 DIAGNOSIS — E669 Obesity, unspecified: Secondary | ICD-10-CM | POA: Diagnosis not present

## 2022-03-16 DIAGNOSIS — F17200 Nicotine dependence, unspecified, uncomplicated: Secondary | ICD-10-CM

## 2022-03-16 NOTE — Progress Notes (Signed)
Subjective:        April Mathis is a 72 y.o. female here for a routine exam.  Current complaints: Vaginal discharge.    Personal health questionnaire:  Is patient Ashkenazi Jewish, have a family history of breast and/or ovarian cancer: no Is there a family history of uterine cancer diagnosed at age < 40, gastrointestinal cancer, urinary tract cancer, family member who is a Field seismologist syndrome-associated carrier: no Is the patient overweight and hypertensive, family history of diabetes, personal history of gestational diabetes, preeclampsia or PCOS: yes Is patient over 33, have PCOS,  family history of premature CHD under age 57, diabetes, smoke, have hypertension or peripheral artery disease:  yes At any time, has a partner hit, kicked or otherwise hurt or frightened you?: no Over the past 2 weeks, have you felt down, depressed or hopeless?: no Over the past 2 weeks, have you felt little interest or pleasure in doing things?:no   Gynecologic History No LMP recorded. Patient is postmenopausal. Contraception: post menopausal status Last Pap: 2022. Results were: normal Last mammogram: 2024. Results were: normal  Obstetric History OB History  Gravida Para Term Preterm AB Living  '6 4 4 '$ 0 2 4  SAB IAB Ectopic Multiple Live Births  1 1 0 0 4    # Outcome Date GA Lbr Len/2nd Weight Sex Delivery Anes PTL Lv  6 SAB           5 Term           4 Term           3 Term           2 Term           1 IAB             Past Medical History:  Diagnosis Date   Bilateral leg edema    DVT of lower extremity (deep venous thrombosis) (HCC)    History of recurrent UTIs    Hypertension    Knee pain    Left knee DJD 2002   MRI done in 2002   Obesity    Smoking    Varicose vein     Past Surgical History:  Procedure Laterality Date   KNEE ARTHROSCOPY     TUBAL LIGATION       Current Outpatient Medications:    amLODipine (NORVASC) 10 MG tablet, Take 10 mg by mouth daily., Disp: , Rfl:     lisinopril (PRINIVIL,ZESTRIL) 20 MG tablet, Take 20 mg by mouth daily., Disp: , Rfl:    amLODipine (NORVASC) 5 MG tablet, Take 5 mg by mouth daily. (Patient not taking: Reported on 10/08/2021), Disp: , Rfl:    aspirin EC 81 MG tablet, Take 81 mg by mouth daily. , Disp: , Rfl:    buPROPion (ZYBAN) 150 MG 12 hr tablet, TAKE 1 TABLET BY MOUTH EVERY MORNING FOR 3 DAYS THEN TWICE A DAY (Patient not taking: Reported on 10/08/2021), Disp: , Rfl:    cefUROXime (CEFTIN) 500 MG tablet, Take 1 tablet (500 mg total) by mouth 2 (two) times daily with a meal. (Patient not taking: Reported on 09/30/2020), Disp: 14 tablet, Rfl: 0   ciprofloxacin (CIPRO) 250 MG tablet, Take 250 mg by mouth 2 (two) times daily. (Patient not taking: Reported on 10/08/2021), Disp: , Rfl:    clotrimazole (LOTRIMIN) 1 % cream, Apply 1 application topically 2 (two) times daily. (Patient not taking: Reported on 09/30/2020), Disp: 60 g, Rfl: 2  clotrimazole (LOTRIMIN) 1 % cream, Apply 1 application topically 2 (two) times daily. (Patient not taking: Reported on 09/30/2020), Disp: 113 g, Rfl: 1   cyclobenzaprine (FLEXERIL) 10 MG tablet, Take 10 mg by mouth 3 (three) times daily as needed. (Patient not taking: Reported on 03/16/2022), Disp: , Rfl:    cyclobenzaprine (FLEXERIL) 5 MG tablet, Take 5 mg by mouth 3 (three) times daily as needed for pain. (Patient not taking: Reported on 03/16/2022), Disp: , Rfl:    doxycycline (VIBRAMYCIN) 100 MG capsule, Take 1 capsule (100 mg total) by mouth 2 (two) times daily. Avoid dairy products while taking Doxycycline. (Patient not taking: Reported on 03/16/2022), Disp: 14 capsule, Rfl: 0   fluconazole (DIFLUCAN) 150 MG tablet, Take 1 tablet (150 mg total) by mouth once for 1 dose. Can take additional dose three days later if symptoms persist, Disp: 1 tablet, Rfl: 3   metroNIDAZOLE (FLAGYL) 500 MG tablet, Take 1 tablet (500 mg total) by mouth 2 (two) times daily. (Patient not taking: Reported on 10/08/2021), Disp: 14  tablet, Rfl: 2   metroNIDAZOLE (METROGEL VAGINAL) 0.75 % vaginal gel, Place 1 Applicatorful vaginally 2 (two) times daily. (Patient not taking: Reported on 09/30/2020), Disp: 70 g, Rfl: 5   nitrofurantoin, macrocrystal-monohydrate, (MACROBID) 100 MG capsule, Take 1 capsule (100 mg total) by mouth 2 (two) times daily. (Patient not taking: Reported on 03/16/2022), Disp: 14 capsule, Rfl: 0   nitrofurantoin, macrocrystal-monohydrate, (MACROBID) 100 MG capsule, Take 1 capsule (100 mg total) by mouth 2 (two) times daily. (Patient not taking: Reported on 03/16/2022), Disp: 14 capsule, Rfl: 0   oxyCODONE (ROXICODONE) 15 MG immediate release tablet, Take 15 mg by mouth every 6 (six) hours as needed. (Patient not taking: Reported on 09/30/2020), Disp: , Rfl:    oxyCODONE-acetaminophen (PERCOCET/ROXICET) 5-325 MG tablet, Take 1 tablet by mouth every 4 (four) hours as needed for severe pain. (Patient not taking: Reported on 09/30/2020), Disp: 30 tablet, Rfl: 0   selenium sulfide (SELSUN) 2.5 % lotion, Apply 1 Application topically daily as needed for irritation. (Patient not taking: Reported on 03/16/2022), Disp: 118 mL, Rfl: 6   terconazole (TERAZOL 7) 0.4 % vaginal cream, Place 1 applicator vaginally at bedtime. (Patient not taking: Reported on 10/08/2021), Disp: 45 g, Rfl: 0 Allergies  Allergen Reactions   Hctz [Hydrochlorothiazide] Other (See Comments)    Reaction:  Drops pts BP   Penicillins Hives and Other (See Comments)    Has patient had a PCN reaction causing immediate rash, facial/tongue/throat swelling, SOB or lightheadedness with hypotension: No Has patient had a PCN reaction causing severe rash involving mucus membranes or skin necrosis: No Has patient had a PCN reaction that required hospitalization No Has patient had a PCN reaction occurring within the last 10 years: No If all of the above answers are "NO", then may proceed with Cephalosporin use.   Ceftin [Cefuroxime] Diarrhea    Social History    Tobacco Use   Smoking status: Every Day    Packs/day: 1.00    Years: 20.00    Total pack years: 20.00    Types: Cigarettes   Smokeless tobacco: Never   Tobacco comments:    1/2 PPD  Substance Use Topics   Alcohol use: Yes    Comment: "every now and then, not on a regular basis     Family History  Problem Relation Age of Onset   Cancer Mother        LUNG   Anesthesia problems Neg Hx  Review of Systems  Constitutional: negative for fatigue and weight loss Respiratory: negative for cough and wheezing Cardiovascular: negative for chest pain, fatigue and palpitations Gastrointestinal: negative for abdominal pain and change in bowel habits Musculoskeletal:negative for myalgias Neurological: negative for gait problems and tremors Behavioral/Psych: negative for abusive relationship, depression Endocrine: negative for temperature intolerance    Genitourinary:negative for abnormal menstrual periods, genital lesions, hot flashes, sexual problems and vaginal discharge Integument/breast: negative for breast lump, breast tenderness, nipple discharge and skin lesion(s)    Objective:       Ht '5\' 2"'$  (1.575 m)   Wt 205 lb 9.6 oz (93.3 kg)   BMI 37.60 kg/m  General:   Alert and no distress  Skin:   no rash or abnormalities  Lungs:   clear to auscultation bilaterally  Heart:   regular rate and rhythm, S1, S2 normal, no murmur, click, rub or gallop  Breasts:   normal without suspicious masses, skin or nipple changes or axillary nodes  Abdomen:  normal findings: no organomegaly, soft, non-tender and no hernia  Pelvis:  External genitalia: normal general appearance Urinary system: urethral meatus normal and bladder without fullness, nontender Vaginal: normal without tenderness, induration or masses Cervix: normal appearance Adnexa: normal bimanual exam Uterus: anteverted and non-tender, normal size   Lab Review Urine pregnancy test Labs reviewed yes Radiologic studies  reviewed yes  I have spent a total of 20 minutes of face-to-face time, excluding clinical staff time, reviewing notes and preparing to see patient, ordering tests and/or medications, and counseling the patient.   Assessment:    1. Encounter for gynecological examination with Papanicolaou smear of cervix Rx: - Cytology - PAP( [facility]) - Urine Culture  2. Vaginal discharge Rx: - Cervicovaginal ancillary only( [facility])  3. Screening for STD (sexually transmitted disease) Rx: - HIV Antibody (routine testing w rflx) - RPR - Hepatitis B Surface AntiGEN - Hepatitis C Antibody  4. Obesity (BMI 35.0-39.9 without comorbidity) - weight reduction recommended  5. Tobacco use disorder - cessation recommended  6. HTN (hypertension), benign - clinically stable.  Managed by PCP     Plan:    Education reviewed: calcium supplements, depression evaluation, low fat, low cholesterol diet, safe sex/STD prevention, self breast exams, smoking cessation, and weight bearing exercise. Follow up in: 1 year.    Orders Placed This Encounter  Procedures   HIV Antibody (routine testing w rflx)   RPR   Hepatitis B Surface AntiGEN   Hepatitis C Antibody   Shelly Bombard, MD 03/16/2022 11:46 AM

## 2022-03-16 NOTE — Progress Notes (Signed)
Patient presents for AEX. Last Pap: 09/30/20 Last Mammogram: Reports 2023, reports normal. Vaginal/Urinary Symptoms: Vaginal burning for a few days STD Screen:Desires STI screen, bloodwork Other Concerns: None

## 2022-03-17 LAB — RPR: RPR Ser Ql: NONREACTIVE

## 2022-03-17 LAB — HEPATITIS B SURFACE ANTIGEN: Hepatitis B Surface Ag: NEGATIVE

## 2022-03-17 LAB — HEPATITIS C ANTIBODY: Hep C Virus Ab: NONREACTIVE

## 2022-03-17 LAB — HIV ANTIBODY (ROUTINE TESTING W REFLEX): HIV Screen 4th Generation wRfx: NONREACTIVE

## 2022-03-18 LAB — CYTOLOGY - PAP
Comment: NEGATIVE
Diagnosis: NEGATIVE
High risk HPV: NEGATIVE

## 2022-03-18 LAB — CERVICOVAGINAL ANCILLARY ONLY
Bacterial Vaginitis (gardnerella): NEGATIVE
Candida Glabrata: NEGATIVE
Candida Vaginitis: NEGATIVE
Chlamydia: NEGATIVE
Comment: NEGATIVE
Comment: NEGATIVE
Comment: NEGATIVE
Comment: NEGATIVE
Comment: NEGATIVE
Comment: NORMAL
Neisseria Gonorrhea: NEGATIVE
Trichomonas: NEGATIVE

## 2022-08-09 ENCOUNTER — Other Ambulatory Visit: Payer: Self-pay | Admitting: Internal Medicine

## 2022-11-07 ENCOUNTER — Encounter: Payer: Self-pay | Admitting: Gastroenterology

## 2022-12-01 ENCOUNTER — Telehealth: Payer: Self-pay

## 2022-12-01 NOTE — Telephone Encounter (Signed)
Pt called stating that she'd been a pt in 2014 and requested an appt. She had a knot on her shoulder after the laser ablation on her leg and she wanted this office to take care of it. She stated that her PCP wouldn't refer her and said it was a lipoma.  Reviewed pt's chart, returned call for clarification, two identifiers used. She stated that the knot was more on her back than shoulder. Informed her that this office does not do any studies or procedures on the back. Instructed her to get a referral to a dermatologist if it was diagnosed as a lipoma. She stated that she doesn't trust anything just by looks. She wants an x-ray. Informed her that this office has [place] capabilities only. She kept saying that Dr. Lajoyce Corners referred her to this office for her leg and she is going to reach out to him to assess her and get an x-ray. Instructed her to reach out to this office if there is a vascular need. Confirmed understanding.

## 2022-12-08 ENCOUNTER — Institutional Professional Consult (permissible substitution): Payer: 59 | Admitting: Plastic Surgery

## 2022-12-12 ENCOUNTER — Ambulatory Visit (INDEPENDENT_AMBULATORY_CARE_PROVIDER_SITE_OTHER): Payer: 59 | Admitting: Orthopedic Surgery

## 2022-12-12 ENCOUNTER — Telehealth: Payer: Self-pay | Admitting: Plastic Surgery

## 2022-12-12 DIAGNOSIS — D1721 Benign lipomatous neoplasm of skin and subcutaneous tissue of right arm: Secondary | ICD-10-CM

## 2022-12-12 NOTE — Telephone Encounter (Signed)
Pt called to sch another apt with provider. Pt was a no show last apt. Patient does not use mychart. She stated she did not know who to use mychart. I am mailing her a letter for an appointment reminder.

## 2022-12-20 ENCOUNTER — Encounter: Payer: Self-pay | Admitting: Orthopedic Surgery

## 2022-12-20 NOTE — Progress Notes (Signed)
Office Visit Note   Patient: April Mathis           Date of Birth: 07-04-1950           MRN: [id number] Visit Date: 12/12/2022              Requested by: Catalina Antigua, MD [address] [place],  [place] [address] PCP: Catalina Antigua, MD  Chief Complaint  Patient presents with   Right Shoulder - Pain      HPI: Patient is a 72 year old woman who presents with a lipoma posterior aspect of the right shoulder.  Patient states she would like to have it lasered off.  Assessment & Plan: Visit Diagnoses:  1. Lipoma of right shoulder     Plan: Recommended follow-up with plastic surgery for the removal of the lipoma.  Discussed that it is not a laser treatment to remove this.  Follow-Up Instructions: No follow-ups on file.   Ortho Exam  Patient is alert, oriented, no adenopathy, well-dressed, normal affect, normal respiratory effort. Examination patient has a 10 cm soft mobile lipoma the posterior aspect of the right shoulder.  The skin has normal color and texture.  The lipoma is soft without adhesions.  Patient inquires whether this could be removed like laser vein ablation.  There is no cellulitis no signs of infection.  Imaging: No results found. No images are attached to the encounter.  Labs: Lab Results  Component Value Date   HGBA1C 5.5 06/24/2013   HGBA1C 5.4 02/20/2013   HGBA1C 5.5 10/09/2012   REPTSTATUS 06/02/2011 FINAL 05/31/2011   CULT No Herpes Simplex Virus detected. 05/31/2011   LABORGA ESCHERICHIA COLI 01/18/2010     Lab Results  Component Value Date   ALBUMIN 4.3 07/28/2009   ALBUMIN 3.9 11/25/2008   ALBUMIN 4.6 06/07/2007    No results found for: "MG" No results found for: "VD25OH"  No results found for: "PREALBUMIN"    Latest Ref Rng & Units 10/20/2020   12:00 AM 02/10/2016   12:01 PM 07/29/2013    7:58 PM  CBC EXTENDED  WBC 3.8 - 10.8 Thousand/uL 6.3  5.0  5.4   RBC 3.80 - 5.10 Million/uL 4.63  4.49  4.35   Hemoglobin 11.7  - 15.5 g/dL 16.0  10.9  32.3   HCT 35.0 - 45.0 % 39.5  39.7  38.6   Platelets 140 - 400 Thousand/uL 244  191  219      There is no height or weight on file to calculate BMI.  Orders:  No orders of the defined types were placed in this encounter.  No orders of the defined types were placed in this encounter.    Procedures: No procedures performed  Clinical Data: No additional findings.  ROS:  All other systems negative, except as noted in the HPI. Review of Systems  Objective: Vital Signs: There were no vitals taken for this visit.  Specialty Comments:  No specialty comments available.  PMFS History: Patient Active Problem List   Diagnosis Date Noted   Chronic pain of left knee 06/29/2016   Cervical spine pain 05/26/2016   Lipoma of right upper extremity 05/26/2016   Spondylolysis, cervical region 12/03/2015   Vaginitis 06/24/2013   Stressful life event affecting family 02/20/2013   DUB (dysfunctional uterine bleeding) 12/05/2011   Varicose veins of bilateral lower extremities with other complications 10/03/2011   Preventative health care 08/11/2010   PREDIABETES 08/17/2009   Unilateral primary osteoarthritis, left knee 12/28/2005  TOBACCO ABUSE 12/01/2005   HYPERTENSION 12/01/2005   Past Medical History:  Diagnosis Date   Bilateral leg edema    DVT of lower extremity (deep venous thrombosis) (HCC)    History of recurrent UTIs    Hypertension    Knee pain    Left knee DJD 2002   MRI done in 2002   Obesity    Smoking    Varicose vein     Family History  Problem Relation Age of Onset   Cancer Mother        LUNG   Anesthesia problems Neg Hx     Past Surgical History:  Procedure Laterality Date   KNEE ARTHROSCOPY     TUBAL LIGATION     Social History   Occupational History   Not on file  Tobacco Use   Smoking status: Every Day    Current packs/day: 1.00    Average packs/day: 1 pack/day for 20.0 years (20.0 ttl pk-yrs)    Types: Cigarettes    Smokeless tobacco: Never   Tobacco comments:    1/2 PPD  Vaping Use   Vaping status: Never Used  Substance and Sexual Activity   Alcohol use: Yes    Comment: "every now and then, not on a regular basis    Drug use: No   Sexual activity: Yes    Partners: Male    Birth control/protection: None

## 2023-01-19 ENCOUNTER — Telehealth: Payer: Self-pay

## 2023-01-19 ENCOUNTER — Ambulatory Visit: Payer: 59 | Admitting: Plastic Surgery

## 2023-01-19 ENCOUNTER — Encounter: Payer: Self-pay | Admitting: Plastic Surgery

## 2023-01-19 VITALS — BP 156/79 | HR 72 | Ht 60.0 in | Wt 195.4 lb

## 2023-01-19 DIAGNOSIS — R2231 Localized swelling, mass and lump, right upper limb: Secondary | ICD-10-CM

## 2023-01-19 DIAGNOSIS — L989 Disorder of the skin and subcutaneous tissue, unspecified: Secondary | ICD-10-CM

## 2023-01-19 DIAGNOSIS — F17210 Nicotine dependence, cigarettes, uncomplicated: Secondary | ICD-10-CM | POA: Diagnosis not present

## 2023-01-19 DIAGNOSIS — D489 Neoplasm of uncertain behavior, unspecified: Secondary | ICD-10-CM

## 2023-01-19 NOTE — Progress Notes (Signed)
 Referring Provider Alger Gong, MD [address] [place],  [place] [address]   CC:  Chief Complaint  Patient presents with   Advice Only      April Mathis is an 73 y.o. female.  HPI: April Mathis is a 73 year old female who presents today for evaluation of a mass on the posterior right shoulder and 1 on the left forearm.  Patient states that the mass on her right posterior shoulder has been present for for 5 years and has been growing slowly.  She states that it is recently become more uncomfortable especially when she hits it but also just at random times.  The mass on her left forearm has been present for an undetermined amount of time and she does not remember any trauma to the area.  Of note she did have a mass removed on the anterior chest underneath the left clavicle.  In addition to having a scar in that area she also has what appears to be a recurrent epidermal inclusion cyst.  She is interested in having this excised as well.  Allergies  Allergen Reactions   Hctz [Hydrochlorothiazide ] Other (See Comments)    Reaction:  Drops pts BP   Penicillins Hives and Other (See Comments)    Has patient had a PCN reaction causing immediate rash, facial/tongue/throat swelling, SOB or lightheadedness with hypotension: No Has patient had a PCN reaction causing severe rash involving mucus membranes or skin necrosis: No Has patient had a PCN reaction that required hospitalization No Has patient had a PCN reaction occurring within the last 10 years: No If all of the above answers are NO, then may proceed with Cephalosporin use.   Ceftin  [Cefuroxime ] Diarrhea    Outpatient Encounter Medications as of 01/19/2023  Medication Sig Note   amLODipine (NORVASC) 10 MG tablet Take 10 mg by mouth daily.    aspirin EC 81 MG tablet Take 81 mg by mouth daily.  05/29/2016: Took 4 tablets this morning 05/29/16   lisinopril  (PRINIVIL ,ZESTRIL ) 20 MG tablet Take 20 mg by mouth daily.    amLODipine  (NORVASC) 5 MG tablet Take 5 mg by mouth daily. (Patient not taking: Reported on 01/19/2023)    buPROPion (ZYBAN) 150 MG 12 hr tablet TAKE 1 TABLET BY MOUTH EVERY MORNING FOR 3 DAYS THEN TWICE A DAY (Patient not taking: Reported on 01/19/2023)    cefUROXime  (CEFTIN ) 500 MG tablet Take 1 tablet (500 mg total) by mouth 2 (two) times daily with a meal. (Patient not taking: Reported on 01/19/2023)    ciprofloxacin  (CIPRO ) 250 MG tablet Take 250 mg by mouth 2 (two) times daily. (Patient not taking: Reported on 01/19/2023)    clotrimazole  (LOTRIMIN ) 1 % cream Apply 1 application topically 2 (two) times daily. (Patient not taking: Reported on 01/19/2023)    clotrimazole  (LOTRIMIN ) 1 % cream Apply 1 application topically 2 (two) times daily. (Patient not taking: Reported on 01/19/2023)    cyclobenzaprine (FLEXERIL) 10 MG tablet Take 10 mg by mouth 3 (three) times daily as needed. (Patient not taking: Reported on 01/19/2023)    cyclobenzaprine (FLEXERIL) 5 MG tablet Take 5 mg by mouth 3 (three) times daily as needed for pain. (Patient not taking: Reported on 03/16/2022)    doxycycline  (VIBRAMYCIN ) 100 MG capsule Take 1 capsule (100 mg total) by mouth 2 (two) times daily. Avoid dairy products while taking Doxycycline . (Patient not taking: Reported on 01/19/2023)    fluconazole  (DIFLUCAN ) 150 MG tablet Take 1 tablet (150 mg total)  by mouth once for 1 dose. Can take additional dose three days later if symptoms persist    metroNIDAZOLE  (FLAGYL ) 500 MG tablet Take 1 tablet (500 mg total) by mouth 2 (two) times daily. (Patient not taking: Reported on 01/19/2023)    metroNIDAZOLE  (METROGEL  VAGINAL) 0.75 % vaginal gel Place 1 Applicatorful vaginally 2 (two) times daily. (Patient not taking: Reported on 01/19/2023)    nitrofurantoin , macrocrystal-monohydrate, (MACROBID ) 100 MG capsule Take 1 capsule (100 mg total) by mouth 2 (two) times daily. (Patient not taking: Reported on 01/19/2023)    nitrofurantoin , macrocrystal-monohydrate, (MACROBID )  100 MG capsule Take 1 capsule (100 mg total) by mouth 2 (two) times daily. (Patient not taking: Reported on 01/19/2023)    oxyCODONE  (ROXICODONE ) 15 MG immediate release tablet Take 15 mg by mouth every 6 (six) hours as needed. (Patient not taking: Reported on 01/19/2023)    oxyCODONE -acetaminophen  (PERCOCET/ROXICET) 5-325 MG tablet Take 1 tablet by mouth every 4 (four) hours as needed for severe pain. (Patient not taking: Reported on 01/19/2023)    selenium  sulfide (SELSUN ) 2.5 % lotion Apply 1 Application topically daily as needed for irritation. (Patient not taking: Reported on 01/19/2023)    terconazole  (TERAZOL 7 ) 0.4 % vaginal cream Place 1 applicator vaginally at bedtime. (Patient not taking: Reported on 01/19/2023)    [DISCONTINUED] Cetirizine  HCl (ZYRTEC  ALLERGY) 10 MG CAPS Take 1 capsule (10 mg total) by mouth daily. (Patient not taking: Reported on 08/15/2018)    [DISCONTINUED] famotidine  (PEPCID ) 40 MG tablet Take 1 tablet (40 mg total) by mouth daily. (Patient not taking: Reported on 08/15/2018)    No facility-administered encounter medications on file as of 01/19/2023.     Past Medical History:  Diagnosis Date   Bilateral leg edema    DVT of lower extremity (deep venous thrombosis) (HCC)    History of recurrent UTIs    Hypertension    Knee pain    Left knee DJD 2002   MRI done in 2002   Obesity    Smoking    Varicose vein     Past Surgical History:  Procedure Laterality Date   KNEE ARTHROSCOPY     TUBAL LIGATION      Family History  Problem Relation Age of Onset   Cancer Mother        LUNG   Anesthesia problems Neg Hx     Social History   Social History Narrative   Not on file     Review of Systems General: Denies fevers, chills, weight loss CV: Denies chest pain, shortness of breath, palpitations Skin: A tender mass on the posterior right shoulder and the nontender mass on the left forearm.  Physical Exam    01/19/2023    8:48 AM 03/16/2022   11:03 AM 10/08/2021     8:15 AM  Vitals with BMI  Height 5' 0 5' 2 5' 2  Weight 195 lbs 6 oz 205 lbs 10 oz 196 lbs 3 oz  BMI 38.16 37.6 35.88  Systolic 156  167  Diastolic 79  80  Pulse 72  71    General:  No acute distress,  Alert and oriented, Non-Toxic, Normal speech and affect Integument: Patient has a mass that is approximately 4 x 4 cm on the posterior shoulder.  It is soft nontender and does not appear to be fixed to any underlying structures.  Is very consistent with a lipoma.  The mass on her left forearm is approximately 1.5 x 1.5 cm and is consistent with  an epidermal inclusion cyst.  The scar on her chest appears to be a recurrent epidermal inclusion cyst as well. Mammogram:  2023 BI-RADS 1 Assessment/Plan Soft tissue mass, right posterior shoulder: This is likely a lipoma.  Given the size this will need to be excised in the operating room.  I have discussed this with her.  She understands that there is a risk of recurrence. Skin lesion, left forearm: This appears to be a epidermal inclusion cyst.  She also understands that there is a risk of recurrence with excision of this.  I have offered to reexcised the scar on her chest as she seems to have a recurrent epidermal inclusion cyst in this area also.  She is in agreement.  All questions were answered to her satisfaction.  Photographs were obtained today with her consent.  Will schedule her for excision of the 3 lesions in the operating room.  Will obtain primary care clearance prior to surgery.  Patient is a smoker, approximately 1 pack/day.  She understands that smoking will affect both her wound healing and the quality/appearance of the scar.  April Mathis 01/19/2023, 9:41 AM

## 2023-01-19 NOTE — Telephone Encounter (Signed)
 Fax sent to PCP Catalina Antigua MD for surgical clearance. Confirmed fax was received.

## 2023-01-20 ENCOUNTER — Telehealth: Payer: Self-pay

## 2023-01-20 NOTE — Telephone Encounter (Signed)
 Re: surgical clearance for Dr. Ladona Ridgel. Thanks, Revonda Standard

## 2023-01-20 NOTE — Telephone Encounter (Signed)
 I called April Mathis today to confirm what provider should be documented as her PCP. She stated her PCP is [name]. Fax sent with confirmation to [name] regarding surgical clearance.

## 2023-01-20 NOTE — Telephone Encounter (Signed)
 Nurse called from [facility] stating they received a Surgical Clearance which should have went to the patient's PCP, Dr. Jolayne Panther which is listed on the patient's storyboard.  Please send S.C. to Dr. Jolayne Panther

## 2023-01-26 ENCOUNTER — Telehealth: Payer: Self-pay

## 2023-01-26 NOTE — Telephone Encounter (Signed)
 Received surgical clearance stating patient is cleared for surgery with the instruction to hold Aspirin 3 days before until day after surgery.

## 2023-02-08 ENCOUNTER — Ambulatory Visit: Payer: 59 | Admitting: Gastroenterology

## 2023-02-15 ENCOUNTER — Ambulatory Visit: Payer: 59 | Admitting: Gastroenterology

## 2023-03-31 ENCOUNTER — Other Ambulatory Visit (HOSPITAL_COMMUNITY)
Admission: RE | Admit: 2023-03-31 | Discharge: 2023-03-31 | Disposition: A | Discharge status: Home / Self Care | Source: Ambulatory Visit | Attending: Obstetrics | Admitting: Obstetrics

## 2023-03-31 ENCOUNTER — Ambulatory Visit: Payer: 59 | Admitting: Obstetrics

## 2023-03-31 ENCOUNTER — Encounter: Payer: Self-pay | Admitting: Obstetrics

## 2023-03-31 VITALS — BP 162/72 | HR 68 | Ht 62.0 in | Wt 204.0 lb

## 2023-03-31 DIAGNOSIS — Z113 Encounter for screening for infections with a predominantly sexual mode of transmission: Secondary | ICD-10-CM | POA: Diagnosis not present

## 2023-03-31 DIAGNOSIS — Z01419 Encounter for gynecological examination (general) (routine) without abnormal findings: Secondary | ICD-10-CM | POA: Insufficient documentation

## 2023-03-31 DIAGNOSIS — N898 Other specified noninflammatory disorders of vagina: Secondary | ICD-10-CM | POA: Insufficient documentation

## 2023-03-31 DIAGNOSIS — F3289 Other specified depressive episodes: Secondary | ICD-10-CM

## 2023-03-31 DIAGNOSIS — Z1151 Encounter for screening for human papillomavirus (HPV): Secondary | ICD-10-CM | POA: Diagnosis not present

## 2023-03-31 DIAGNOSIS — I10 Essential (primary) hypertension: Secondary | ICD-10-CM

## 2023-03-31 DIAGNOSIS — E669 Obesity, unspecified: Secondary | ICD-10-CM

## 2023-03-31 DIAGNOSIS — Z1239 Encounter for other screening for malignant neoplasm of breast: Secondary | ICD-10-CM | POA: Diagnosis not present

## 2023-03-31 DIAGNOSIS — F17200 Nicotine dependence, unspecified, uncomplicated: Secondary | ICD-10-CM

## 2023-03-31 LAB — POCT URINALYSIS DIPSTICK
Bilirubin, UA: NEGATIVE
Blood, UA: NEGATIVE
Glucose, UA: NEGATIVE
Leukocytes, UA: NEGATIVE
Nitrite, UA: NEGATIVE
Protein, UA: POSITIVE — AB
Spec Grav, UA: 1.015 (ref 1.010–1.025)
Urobilinogen, UA: 0.2 U/dL
pH, UA: 6 (ref 5.0–8.0)

## 2023-03-31 NOTE — Progress Notes (Signed)
 Subjective:        April Mathis is a 73 y.o. female here for a routine exam.  Current complaints: Mild burning wit urination..    Personal health questionnaire:  Is patient Ashkenazi Jewish, have a family history of breast and/or ovarian cancer: no Is there a family history of uterine cancer diagnosed at age < 28, gastrointestinal cancer, urinary tract cancer, family member who is a Personnel officer syndrome-associated carrier: no Is the patient overweight and hypertensive, family history of diabetes, personal history of gestational diabetes, preeclampsia or PCOS: no Is patient over 20, have PCOS,  family history of premature CHD under age 38, diabetes, smoke, have hypertension or peripheral artery disease:  no At any time, has a partner hit, kicked or otherwise hurt or frightened you?: no Over the past 2 weeks, have you felt down, depressed or hopeless?: yes Over the past 2 weeks, have you felt little interest or pleasure in doing things?:yes   Gynecologic History No LMP recorded. Patient is postmenopausal. Contraception: post menopausal status Last Pap: 03-16-22. Results were: normal Last mammogram: 2017. Results were: normal  Obstetric History OB History  Gravida Para Term Preterm AB Living  6 4 4  0 2 4  SAB IAB Ectopic Multiple Live Births  1 1 0 0 4    # Outcome Date GA Lbr Len/2nd Weight Sex Type Anes PTL Lv  6 SAB           5 Term           4 Term           3 Term           2 Term           1 IAB             Past Medical History:  Diagnosis Date   Bilateral leg edema    DVT of lower extremity (deep venous thrombosis) (HCC)    History of recurrent UTIs    Hypertension    Knee pain    Left knee DJD 2002   MRI done in 2002   Obesity    Smoking    Varicose vein     Past Surgical History:  Procedure Laterality Date   KNEE ARTHROSCOPY     TUBAL LIGATION       Current Outpatient Medications:    amLODipine (NORVASC) 10 MG tablet, Take 10 mg by mouth daily., Disp: ,  Rfl:    aspirin EC 81 MG tablet, Take 81 mg by mouth daily. Swallow whole. Started on own., Disp: , Rfl:    amLODipine (NORVASC) 5 MG tablet, Take 5 mg by mouth daily. (Patient not taking: Reported on 01/19/2023), Disp: , Rfl:    aspirin EC 81 MG tablet, Take 81 mg by mouth daily. , Disp: , Rfl:    buPROPion (ZYBAN) 150 MG 12 hr tablet, TAKE 1 TABLET BY MOUTH EVERY MORNING FOR 3 DAYS THEN TWICE A DAY (Patient not taking: Reported on 01/19/2023), Disp: , Rfl:    cefUROXime (CEFTIN) 500 MG tablet, Take 1 tablet (500 mg total) by mouth 2 (two) times daily with a meal. (Patient not taking: Reported on 01/19/2023), Disp: 14 tablet, Rfl: 0   ciprofloxacin (CIPRO) 250 MG tablet, Take 250 mg by mouth 2 (two) times daily. (Patient not taking: Reported on 01/19/2023), Disp: , Rfl:    clotrimazole (LOTRIMIN) 1 % cream, Apply 1 application topically 2 (two) times daily. (Patient not taking: Reported on 01/19/2023),  Disp: 60 g, Rfl: 2   clotrimazole (LOTRIMIN) 1 % cream, Apply 1 application topically 2 (two) times daily. (Patient not taking: Reported on 01/19/2023), Disp: 113 g, Rfl: 1   cyclobenzaprine (FLEXERIL) 10 MG tablet, Take 10 mg by mouth 3 (three) times daily as needed. (Patient not taking: Reported on 01/19/2023), Disp: , Rfl:    cyclobenzaprine (FLEXERIL) 5 MG tablet, Take 5 mg by mouth 3 (three) times daily as needed for pain. (Patient not taking: Reported on 03/16/2022), Disp: , Rfl:    doxycycline (VIBRAMYCIN) 100 MG capsule, Take 1 capsule (100 mg total) by mouth 2 (two) times daily. Avoid dairy products while taking Doxycycline. (Patient not taking: Reported on 01/19/2023), Disp: 14 capsule, Rfl: 0   fluconazole (DIFLUCAN) 150 MG tablet, Take 1 tablet (150 mg total) by mouth once for 1 dose. Can take additional dose three days later if symptoms persist, Disp: 1 tablet, Rfl: 3   lisinopril (PRINIVIL,ZESTRIL) 20 MG tablet, Take 20 mg by mouth daily., Disp: , Rfl:    metroNIDAZOLE (FLAGYL) 500 MG tablet, Take 1 tablet  (500 mg total) by mouth 2 (two) times daily. (Patient not taking: Reported on 01/19/2023), Disp: 14 tablet, Rfl: 2   metroNIDAZOLE (METROGEL VAGINAL) 0.75 % vaginal gel, Place 1 Applicatorful vaginally 2 (two) times daily. (Patient not taking: Reported on 01/19/2023), Disp: 70 g, Rfl: 5   nitrofurantoin, macrocrystal-monohydrate, (MACROBID) 100 MG capsule, Take 1 capsule (100 mg total) by mouth 2 (two) times daily. (Patient not taking: Reported on 01/19/2023), Disp: 14 capsule, Rfl: 0   nitrofurantoin, macrocrystal-monohydrate, (MACROBID) 100 MG capsule, Take 1 capsule (100 mg total) by mouth 2 (two) times daily. (Patient not taking: Reported on 01/19/2023), Disp: 14 capsule, Rfl: 0   oxyCODONE (ROXICODONE) 15 MG immediate release tablet, Take 15 mg by mouth every 6 (six) hours as needed. (Patient not taking: Reported on 01/19/2023), Disp: , Rfl:    oxyCODONE-acetaminophen (PERCOCET/ROXICET) 5-325 MG tablet, Take 1 tablet by mouth every 4 (four) hours as needed for severe pain. (Patient not taking: Reported on 01/19/2023), Disp: 30 tablet, Rfl: 0   selenium sulfide (SELSUN) 2.5 % lotion, Apply 1 Application topically daily as needed for irritation. (Patient not taking: Reported on 01/19/2023), Disp: 118 mL, Rfl: 6   terconazole (TERAZOL 7) 0.4 % vaginal cream, Place 1 applicator vaginally at bedtime. (Patient not taking: Reported on 01/19/2023), Disp: 45 g, Rfl: 0 Allergies  Allergen Reactions   Hctz [Hydrochlorothiazide] Other (See Comments)    Reaction:  Drops pts BP   Penicillins Hives and Other (See Comments)    Has patient had a PCN reaction causing immediate rash, facial/tongue/throat swelling, SOB or lightheadedness with hypotension: No Has patient had a PCN reaction causing severe rash involving mucus membranes or skin necrosis: No Has patient had a PCN reaction that required hospitalization No Has patient had a PCN reaction occurring within the last 10 years: No If all of the above answers are "NO", then  may proceed with Cephalosporin use.   Ceftin [Cefuroxime] Diarrhea    Social History   Tobacco Use   Smoking status: Every Day    Current packs/day: 1.00    Average packs/day: 1 pack/day for 20.0 years (20.0 ttl pk-yrs)    Types: Cigarettes   Smokeless tobacco: Never   Tobacco comments:    1/2 PPD  Substance Use Topics   Alcohol use: Never    Comment: "every now and then, not on a regular basis     Family History  Problem Relation Age of Onset   Cancer Mother        LUNG   Anesthesia problems Neg Hx       Review of Systems  Constitutional: negative for fatigue and weight loss Respiratory: negative for cough and wheezing Cardiovascular: negative for chest pain, fatigue and palpitations Gastrointestinal: negative for abdominal pain and change in bowel habits Musculoskeletal:negative for myalgias Neurological: negative for gait problems and tremors Behavioral/Psych: negative for abusive relationship, depression Endocrine: negative for temperature intolerance    Genitourinary: positive for vaginal discharge and mild dysuria.  negative for abnormal menstrual periods, genital lesions, hot flashes, sexual problems  Integument/breast: negative for breast lump, breast tenderness, nipple discharge and skin lesion(s)    Objective:       BP (!) 162/72   Pulse 68   Ht 5\' 2"  (1.575 m)   Wt 204 lb (92.5 kg)   BMI 37.31 kg/m  General:   Alert and no distress  Skin:   no rash or abnormalities  Lungs:   clear to auscultation bilaterally  Heart:   regular rate and rhythm, S1, S2 normal, no murmur, click, rub or gallop  Breasts:   normal without suspicious masses, skin or nipple changes or axillary nodes  Abdomen:  normal findings: no organomegaly, soft, non-tender and no hernia  Pelvis:  External genitalia: normal general appearance Urinary system: urethral meatus normal and bladder without fullness, nontender Vaginal: normal without tenderness, induration or masses Cervix:  normal appearance Adnexa: normal bimanual exam Uterus: anteverted and non-tender, normal size   Lab Review Urine pregnancy test Labs reviewed yes Radiologic studies reviewed yes  I have spent a total of 30 minutes of face-to-face time, excluding clinical staff time, reviewing notes and preparing to see patient, ordering tests and/or medications, and counseling the patient.    Assessment:    1. Encounter fo r gynecological examination with Papanicolaou smear of cervix (Primary) Rx: - Cytology - PAP( [place]) - Urinalysis, Routine w reflex microscopic - POCT Urinalysis Dipstick   2. Vaginal discharge Rx: - Cervicovaginal ancillary only( [place])  3. Screening for STD (sexually transmitted disease) Rx: - HIV antibody (with reflex) - RPR - Hepatitis C Antibody - Hepatitis B surface antigen  4. Screening breast examination Rx: - MM Digital Screening; Future  5. Obesity (BMI 35.0-39.9 without comorbidity) - weight reduction with the aid of dietary changes, exercise and behavioral modification recommended  6. HTN (hypertension), benign - managed by PCP  7. Tobacco use disorder - cessation recommended - discussed risks  8. 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 Mammogram ordered. Follow up in: 1 year.    Orders Placed This Encounter  Procedures   Microscopic Examination   MM Digital Screening    Standing Status:   Future    Expiration Date:   04/01/2024    Reason for Exam (SYMPTOM  OR DIAGNOSIS REQUIRED):   Screening    Preferred imaging location?:   [facility]   HIV antibody (with reflex)   RPR   Hepatitis C Antibody   Urinalysis, Routine w reflex microscopic   Hepatitis B surface antigen   POCT  Urinalysis Dipstick   Brock Bad, MD, FACOG Attending Obstetrician & Gynecologist, [facility], [facility],  [place] 03/31/2023

## 2023-03-31 NOTE — Progress Notes (Signed)
 Had last mammo last month. Due to have repeat colonoscopy in July. Sees therapist for depression. Wants Pap and check for UTI. Has some burning.

## 2023-04-01 LAB — URINALYSIS, ROUTINE W REFLEX MICROSCOPIC
Bilirubin, UA: NEGATIVE
Glucose, UA: NEGATIVE
Ketones, UA: NEGATIVE
Nitrite, UA: NEGATIVE
Protein,UA: NEGATIVE
RBC, UA: NEGATIVE
Specific Gravity, UA: 1.016 (ref 1.005–1.030)
Urobilinogen, Ur: 0.2 mg/dL (ref 0.2–1.0)
pH, UA: 6 (ref 5.0–7.5)

## 2023-04-01 LAB — MICROSCOPIC EXAMINATION
Casts: NONE SEEN /LPF
RBC, Urine: NONE SEEN /HPF (ref 0–2)

## 2023-04-01 LAB — HIV ANTIBODY (ROUTINE TESTING W REFLEX): HIV Screen 4th Generation wRfx: NONREACTIVE

## 2023-04-01 LAB — RPR: RPR Ser Ql: NONREACTIVE

## 2023-04-01 LAB — HEPATITIS C ANTIBODY: Hep C Virus Ab: NONREACTIVE

## 2023-04-01 LAB — HEPATITIS B SURFACE ANTIGEN: Hepatitis B Surface Ag: NEGATIVE

## 2023-04-03 LAB — CERVICOVAGINAL ANCILLARY ONLY
Bacterial Vaginitis (gardnerella): POSITIVE — AB
Candida Glabrata: NEGATIVE
Candida Vaginitis: NEGATIVE
Chlamydia: NEGATIVE
Comment: NEGATIVE
Comment: NEGATIVE
Comment: NEGATIVE
Comment: NEGATIVE
Comment: NEGATIVE
Comment: NORMAL
Neisseria Gonorrhea: NEGATIVE
Trichomonas: NEGATIVE

## 2023-04-04 ENCOUNTER — Telehealth: Payer: Self-pay | Admitting: *Deleted

## 2023-04-04 ENCOUNTER — Other Ambulatory Visit: Payer: Self-pay | Admitting: *Deleted

## 2023-04-04 DIAGNOSIS — B9689 Other specified bacterial agents as the cause of diseases classified elsewhere: Secondary | ICD-10-CM

## 2023-04-04 LAB — CYTOLOGY - PAP
Comment: NEGATIVE
Diagnosis: NEGATIVE
Diagnosis: REACTIVE
High risk HPV: NEGATIVE

## 2023-04-04 MED ORDER — METRONIDAZOLE 0.75 % VA GEL: 1.0000 | Freq: Two times a day (BID) | VAGINAL | 1 refills | Status: AC

## 2023-04-04 NOTE — Telephone Encounter (Signed)
 RTC to pt regarding annual exam results. All results negative or WNL with exception of BV noted on swab. Dr. Clearance Coots was consulted. Metrogel twice a day reordered.

## 2023-04-12 ENCOUNTER — Telehealth: Payer: Self-pay

## 2023-04-12 NOTE — Telephone Encounter (Signed)
 Pt called reporting plastic piece stuck in vagina. States she was washing herself when a piece of the plastic that holds brand new wash cloths together came off and got stuck in her vagina. Pt does report seeing blood afterwards. Pt currently on gel for BV, instructed pt not to insert anything into vagina until her appt tomorrow at 1330 as to not push the plastic piece up further. Advised we would tell her then about resuming her gel insertion. Pt voiced understanding.

## 2023-04-13 ENCOUNTER — Encounter: Payer: Self-pay | Admitting: Obstetrics

## 2023-04-13 ENCOUNTER — Ambulatory Visit: Admitting: Obstetrics

## 2023-04-13 VITALS — BP 147/62 | HR 68 | Ht 62.0 in | Wt 201.9 lb

## 2023-04-13 DIAGNOSIS — T192XXA Foreign body in vulva and vagina, initial encounter: Secondary | ICD-10-CM | POA: Diagnosis not present

## 2023-04-13 NOTE — Progress Notes (Signed)
 Pt presents for plastic in vagina.

## 2023-04-13 NOTE — Progress Notes (Signed)
 Patient ID: April Mathis, female   DOB: Oct 29, 1950, 73 y.o.   MRN: [id number]  No chief complaint on file.   HPI April Mathis is a 73 y.o. female.  Complains of possible plastic in vagina from wash cloth.  Denies vaginal discharge, pain or bleeding. HPI  Past Medical History:  Diagnosis Date   Bilateral leg edema    DVT of lower extremity (deep venous thrombosis) (HCC)    History of recurrent UTIs    Hypertension    Knee pain    Left knee DJD 2002   MRI done in 2002   Obesity    Smoking    Varicose vein     Past Surgical History:  Procedure Laterality Date   KNEE ARTHROSCOPY     TUBAL LIGATION      Family History  Problem Relation Age of Onset   Cancer Mother        LUNG   Anesthesia problems Neg Hx     Social History Social History   Tobacco Use   Smoking status: Every Day    Current packs/day: 1.00    Average packs/day: 1 pack/day for 20.0 years (20.0 ttl pk-yrs)    Types: Cigarettes   Smokeless tobacco: Never   Tobacco comments:    1/2 PPD  Vaping Use   Vaping status: Never Used  Substance Use Topics   Alcohol use: Never    Comment: "every now and then, not on a regular basis    Drug use: No    Allergies  Allergen Reactions   Hctz [Hydrochlorothiazide] Other (See Comments)    Reaction:  Drops pts BP   Penicillins Hives and Other (See Comments)    Has patient had a PCN reaction causing immediate rash, facial/tongue/throat swelling, SOB or lightheadedness with hypotension: No Has patient had a PCN reaction causing severe rash involving mucus membranes or skin necrosis: No Has patient had a PCN reaction that required hospitalization No Has patient had a PCN reaction occurring within the last 10 years: No If all of the above answers are "NO", then may proceed with Cephalosporin use.   Ceftin [Cefuroxime] Diarrhea    Current Outpatient Medications  Medication Sig Dispense Refill   amLODipine (NORVASC) 10 MG tablet Take 10 mg by mouth daily.      aspirin EC 81 MG tablet Take 81 mg by mouth daily. Swallow whole. Started on own.     lisinopril (PRINIVIL,ZESTRIL) 20 MG tablet Take 20 mg by mouth daily.     metroNIDAZOLE (METROGEL VAGINAL) 0.75 % vaginal gel Place 1 Applicatorful vaginally 2 (two) times daily. Use for 5 days 70 g 1   amLODipine (NORVASC) 5 MG tablet Take 5 mg by mouth daily. (Patient not taking: Reported on 10/08/2021)     aspirin EC 81 MG tablet Take 81 mg by mouth daily.      buPROPion (ZYBAN) 150 MG 12 hr tablet TAKE 1 TABLET BY MOUTH EVERY MORNING FOR 3 DAYS THEN TWICE A DAY (Patient not taking: Reported on 10/08/2021)     cefUROXime (CEFTIN) 500 MG tablet Take 1 tablet (500 mg total) by mouth 2 (two) times daily with a meal. (Patient not taking: Reported on 09/30/2020) 14 tablet 0   ciprofloxacin (CIPRO) 250 MG tablet Take 250 mg by mouth 2 (two) times daily. (Patient not taking: Reported on 10/08/2021)     clotrimazole (LOTRIMIN) 1 % cream Apply 1 application topically 2 (two) times daily. (Patient not taking: Reported on 09/30/2020) 60 g  2   clotrimazole (LOTRIMIN) 1 % cream Apply 1 application topically 2 (two) times daily. (Patient not taking: Reported on 09/30/2020) 113 g 1   cyclobenzaprine (FLEXERIL) 10 MG tablet Take 10 mg by mouth 3 (three) times daily as needed. (Patient not taking: Reported on 03/16/2022)     cyclobenzaprine (FLEXERIL) 5 MG tablet Take 5 mg by mouth 3 (three) times daily as needed for pain. (Patient not taking: Reported on 03/16/2022)     doxycycline (VIBRAMYCIN) 100 MG capsule Take 1 capsule (100 mg total) by mouth 2 (two) times daily. Avoid dairy products while taking Doxycycline. (Patient not taking: Reported on 03/16/2022) 14 capsule 0   fluconazole (DIFLUCAN) 150 MG tablet Take 1 tablet (150 mg total) by mouth once for 1 dose. Can take additional dose three days later if symptoms persist 1 tablet 3   metroNIDAZOLE (FLAGYL) 500 MG tablet Take 1 tablet (500 mg total) by mouth 2 (two) times daily. (Patient  not taking: Reported on 10/08/2021) 14 tablet 2   nitrofurantoin, macrocrystal-monohydrate, (MACROBID) 100 MG capsule Take 1 capsule (100 mg total) by mouth 2 (two) times daily. (Patient not taking: Reported on 03/16/2022) 14 capsule 0   nitrofurantoin, macrocrystal-monohydrate, (MACROBID) 100 MG capsule Take 1 capsule (100 mg total) by mouth 2 (two) times daily. (Patient not taking: Reported on 03/16/2022) 14 capsule 0   oxyCODONE (ROXICODONE) 15 MG immediate release tablet Take 15 mg by mouth every 6 (six) hours as needed. (Patient not taking: Reported on 09/30/2020)     oxyCODONE-acetaminophen (PERCOCET/ROXICET) 5-325 MG tablet Take 1 tablet by mouth every 4 (four) hours as needed for severe pain. (Patient not taking: Reported on 09/30/2020) 30 tablet 0   selenium sulfide (SELSUN) 2.5 % lotion Apply 1 Application topically daily as needed for irritation. (Patient not taking: Reported on 03/16/2022) 118 mL 6   terconazole (TERAZOL 7) 0.4 % vaginal cream Place 1 applicator vaginally at bedtime. (Patient not taking: Reported on 10/08/2021) 45 g 0   No current facility-administered medications for this visit.    Review of Systems Review of Systems Constitutional: negative for fatigue and weight loss Respiratory: negative for cough and wheezing Cardiovascular: negative for chest pain, fatigue and palpitations Gastrointestinal: negative for abdominal pain and change in bowel habits Genitourinary: possible plastic in vagina Integument/breast: negative for nipple discharge Musculoskeletal:negative for myalgias Neurological: negative for gait problems and tremors Behavioral/Psych: negative for abusive relationship, depression Endocrine: negative for temperature intolerance      Blood pressure (!) 147/62, pulse 68, height 5\' 2"  (1.575 m), weight 201 lb 14.4 oz (91.6 kg).  Physical Exam Physical Exam General:   Alert and no distress  Skin:   no rash or abnormalities  Lungs:   clear to auscultation  bilaterally  Heart:   regular rate and rhythm, S1, S2 normal, no murmur, click, rub or gallop  Breasts:   normal without suspicious masses, skin or nipple changes or axillary nodes  Abdomen:  normal findings: no organomegaly, soft, non-tender and no hernia  Pelvis:  External genitalia: normal general appearance Urinary system: urethral meatus normal and bladder without fullness, nontender Vaginal: normal without tenderness, induration or masses Cervix: normal appearance Adnexa: normal bimanual exam Uterus: anteverted and non-tender, normal size    I have spent a total of 20 minutes of face-to-face ime, excluding clinical staff time, reviewing notes and preparing to see patient, ordering tests and/or medications, and counseling the patient.   Data Reviewed 20  Assessment     1. Vaginal  foreign body, initial encounter (Primary) - resolved     Plan   Follow up prn  Brock Bad, MD, FACOG Attending Obstetrician & Gynecologist, [facility] for [facility], [facility], [place] 04/13/2023

## 2023-06-21 ENCOUNTER — Ambulatory Visit (INDEPENDENT_AMBULATORY_CARE_PROVIDER_SITE_OTHER): Admitting: Obstetrics and Gynecology

## 2023-06-21 ENCOUNTER — Other Ambulatory Visit (HOSPITAL_COMMUNITY)
Admission: RE | Admit: 2023-06-21 | Discharge: 2023-06-21 | Disposition: A | Discharge status: Home / Self Care | Source: Ambulatory Visit | Attending: Obstetrics | Admitting: Obstetrics

## 2023-06-21 VITALS — BP 156/68 | HR 74 | Ht 62.0 in | Wt 206.4 lb

## 2023-06-21 DIAGNOSIS — Z113 Encounter for screening for infections with a predominantly sexual mode of transmission: Secondary | ICD-10-CM | POA: Diagnosis not present

## 2023-06-21 DIAGNOSIS — I10 Essential (primary) hypertension: Secondary | ICD-10-CM | POA: Diagnosis not present

## 2023-06-21 NOTE — Progress Notes (Signed)
   GYNECOLOGY PROGRESS NOTE  History:  73 y.o. [id number] presents to [facility]  office today for problem visit. She is requesting STD testing today. She is sexually active and reports the condom broke. She is unaware if she was exposed to anything, but could have been a potential exposure.   Health Maintenance Due  Topic Date Due   Medicare Annual Wellness (AWV)  Never done   Pneumonia Vaccine 8+ Years old (1 of 2 - PCV) Never done   Colonoscopy  Never done   Lung Cancer Screening  Never done   Zoster Vaccines- Shingrix (1 of 2) Never done   DEXA SCAN  Never done   DTaP/Tdap/Td (2 - Tdap) 07/24/2019   COVID-19 Vaccine (1 - 2024-25 season) Never done   MAMMOGRAM  03/09/2023     Review of Systems:  Pertinent items are noted in HPI.   Objective:  Physical Exam Blood pressure (!) 156/68, pulse 74, height 5\' 2"  (1.575 m), weight 206 lb 6.4 oz (93.6 kg). VS reviewed, nursing note reviewed,  Constitutional: well developed, well nourished, no distress HEENT: normocephalic Pulm/chest wall: normal effort Abdomen: soft Neuro: alert and oriented  Skin: warm, dry Psych: affect normal Pelvic exam:  external genitalia normal, no abnormal d/c, swab collected in the presence of a chaperone    Assessment & Plan:  1. Screen for STD (sexually transmitted disease) (Primary) Swab and blood work collected today, will follow up with results  Condoms provided today - Cervicovaginal ancillary only( [place]) - HIV antibody (with reflex) - RPR - Hepatitis C Antibody - Hepatitis B Surface AntiGEN  2. HYPERTENSION Management by pcp, has not taken her meds yet toay    Susi Eric, FNP

## 2023-06-21 NOTE — Progress Notes (Signed)
 Pt presents for all std testing.

## 2023-06-22 ENCOUNTER — Ambulatory Visit: Payer: Self-pay | Admitting: Obstetrics and Gynecology

## 2023-06-22 LAB — HEPATITIS C ANTIBODY: Hep C Virus Ab: NONREACTIVE

## 2023-06-22 LAB — RPR: RPR Ser Ql: NONREACTIVE

## 2023-06-22 LAB — CERVICOVAGINAL ANCILLARY ONLY
Bacterial Vaginitis (gardnerella): NEGATIVE
Candida Glabrata: NEGATIVE
Candida Vaginitis: NEGATIVE
Chlamydia: NEGATIVE
Comment: NEGATIVE
Comment: NEGATIVE
Comment: NEGATIVE
Comment: NEGATIVE
Comment: NEGATIVE
Comment: NORMAL
Neisseria Gonorrhea: NEGATIVE
Trichomonas: NEGATIVE

## 2023-06-22 LAB — HIV ANTIBODY (ROUTINE TESTING W REFLEX): HIV Screen 4th Generation wRfx: NONREACTIVE

## 2023-06-22 LAB — HEPATITIS B SURFACE ANTIGEN: Hepatitis B Surface Ag: NEGATIVE

## 2023-10-19 ENCOUNTER — Ambulatory Visit (INDEPENDENT_AMBULATORY_CARE_PROVIDER_SITE_OTHER): Admitting: Orthopedic Surgery

## 2023-10-19 ENCOUNTER — Encounter: Payer: Self-pay | Admitting: Orthopedic Surgery

## 2023-10-19 DIAGNOSIS — G8929 Other chronic pain: Secondary | ICD-10-CM | POA: Diagnosis not present

## 2023-10-19 DIAGNOSIS — M25562 Pain in left knee: Secondary | ICD-10-CM

## 2023-10-19 DIAGNOSIS — I83893 Varicose veins of bilateral lower extremities with other complications: Secondary | ICD-10-CM

## 2023-10-19 MED ORDER — LIDOCAINE HCL (PF) 1 % IJ SOLN
5.0000 mL | INTRAMUSCULAR | Status: AC | PRN
  Administered 2023-10-19: 5 mL

## 2023-10-19 MED ORDER — METHYLPREDNISOLONE ACETATE 40 MG/ML IJ SUSP
40.0000 mg | INTRAMUSCULAR | Status: AC | PRN
  Administered 2023-10-19: 40 mg via INTRA_ARTICULAR

## 2023-10-19 NOTE — Progress Notes (Signed)
 Office Visit Note   Patient: April Mathis           Date of Birth: [birth year]           MRN: [id number] Visit Date: 10/19/2023              Requested by: Shelda Atlas, MD [address] [place],  [place] [address] PCP: Shelda Atlas, MD  Chief Complaint  Patient presents with   Left Knee - Pain      HPI: Discussed the use of AI scribe software for clinical note transcription with the patient, who gave verbal consent to proceed.  History of Present Illness April Mathis is a 73 year old female who presents with left knee pain.  She experiences persistent pain in her left knee, particularly when touched on the bone and on the sides, accompanied by swelling. She has a history of arthroscopic intervention on the knee, though the exact timeline is unspecified.  She has a history of venous ablation with vascular surgery and wants to follow up with her vascular specialist. She mentions needing a referral to see her again as it has been five to ten years since her last visit.     Assessment & Plan: Visit Diagnoses:  1. Varicose veins of bilateral lower extremities with other complications   2. Chronic pain of left knee     Plan: Assessment and Plan Assessment & Plan Left knee osteoarthritis Persistent arthritic symptoms with previous arthroscopic intervention. No effusion, mild crepitation, tenderness over medial joint line. Collaterals and cruciates stable. Swelling contributes to pain. - Administer cortisone injection to the left knee.  Left lower extremity edema and suspected venous insufficiency Post venous ablation with pitting edema, no venous ulcers. Tender to palpation. She requested vascular surgery follow-up. - Patient request refer to vascular surgery for venous insufficiency evaluation.      Follow-Up Instructions: Return if symptoms worsen or fail to improve.   Ortho Exam  Patient is alert, oriented, no adenopathy, well-dressed, normal affect, normal  respiratory effort. Physical Exam EXTREMITIES: No venous ulcers, pitting edema tender to palpation in left leg. MUSCULOSKELETAL: Left knee no effusion, mild crepitation with range of motion, collaterals and cruciates stable, maximal tenderness to palpation over medial joint line, swelling in left knee.  No venous ulcers.      Imaging: No results found. No images are attached to the encounter.  Labs: Lab Results  Component Value Date   HGBA1C 5.5 06/24/2013   HGBA1C 5.4 02/20/2013   HGBA1C 5.5 10/09/2012   REPTSTATUS 06/02/2011 FINAL 05/31/2011   CULT No Herpes Simplex Virus detected. 05/31/2011   LABORGA ESCHERICHIA COLI 01/18/2010     Lab Results  Component Value Date   ALBUMIN 4.3 07/28/2009   ALBUMIN 3.9 11/25/2008   ALBUMIN 4.6 06/07/2007    No results found for: MG No results found for: VD25OH  No results found for: PREALBUMIN    Latest Ref Rng & Units 10/20/2020   12:00 AM 02/10/2016   12:01 PM 07/29/2013    7:58 PM  CBC EXTENDED  WBC 3.8 - 10.8 Thousand/uL 6.3  5.0  5.4   RBC 3.80 - 5.10 Million/uL 4.63  4.49  4.35   Hemoglobin 11.7 - 15.5 g/dL 87.4  87.0  87.5   HCT 35.0 - 45.0 % 39.5  39.7  38.6   Platelets 140 - 400 Thousand/uL 244  191  219      There is no height or weight on file to calculate  BMI.  Orders:  Orders Placed This Encounter  Procedures   Ambulatory referral to Vascular Surgery   No orders of the defined types were placed in this encounter.    Procedures: Large Joint Inj: L knee on 10/19/2023 12:43 PM Indications: pain and diagnostic evaluation Details: 22 G 1.5 in needle, anteromedial approach  Arthrogram: No  Medications: 5 mL lidocaine  (PF) 1 %; 40 mg methylPREDNISolone  acetate 40 MG/ML Outcome: tolerated well, no immediate complications Procedure, treatment alternatives, risks and benefits explained, specific risks discussed. Consent was given by the patient. Immediately prior to procedure a time out was called to  verify the correct patient, procedure, equipment, support staff and site/side marked as required. Patient was prepped and draped in the usual sterile fashion.      Clinical Data: No additional findings.  ROS:  All other systems negative, except as noted in the HPI. Review of Systems  Objective: Vital Signs: There were no vitals taken for this visit.  Specialty Comments:  No specialty comments available.  PMFS History: Patient Active Problem List   Diagnosis Date Noted   Chronic pain of left knee 06/29/2016   Cervical spine pain 05/26/2016   Lipoma of right upper extremity 05/26/2016   Spondylolysis, cervical region 12/03/2015   Vaginitis 06/24/2013   Stressful life event affecting family 02/20/2013   DUB (dysfunctional uterine bleeding) 12/05/2011   Varicose veins of bilateral lower extremities with other complications 10/03/2011   Preventative health care 08/11/2010   PREDIABETES 08/17/2009   Unilateral primary osteoarthritis, left knee 12/28/2005   TOBACCO ABUSE 12/01/2005   HYPERTENSION 12/01/2005   Past Medical History:  Diagnosis Date   Bilateral leg edema    DVT of lower extremity (deep venous thrombosis) (HCC)    History of recurrent UTIs    Hypertension    Knee pain    Left knee DJD 2002   MRI done in 2002   Obesity    Smoking    Varicose vein     Family History  Problem Relation Age of Onset   Cancer Mother        LUNG   Anesthesia problems Neg Hx     Past Surgical History:  Procedure Laterality Date   KNEE ARTHROSCOPY     TUBAL LIGATION     Social History   Occupational History   Not on file  Tobacco Use   Smoking status: Every Day    Current packs/day: 1.00    Average packs/day: 1 pack/day for 20.0 years (20.0 ttl pk-yrs)    Types: Cigarettes   Smokeless tobacco: Never   Tobacco comments:    1/2 PPD  Vaping Use   Vaping status: Never Used  Substance and Sexual Activity   Alcohol use: Never    Comment: every now and then, not on a  regular basis    Drug use: No   Sexual activity: Yes    Partners: Male    Birth control/protection: None

## 2023-11-14 ENCOUNTER — Other Ambulatory Visit: Payer: Self-pay

## 2023-11-14 DIAGNOSIS — I83893 Varicose veins of bilateral lower extremities with other complications: Secondary | ICD-10-CM

## 2023-12-13 ENCOUNTER — Encounter: Payer: Self-pay | Admitting: Vascular Surgery

## 2023-12-13 ENCOUNTER — Ambulatory Visit (HOSPITAL_COMMUNITY)
Admission: RE | Admit: 2023-12-13 | Discharge: 2023-12-13 | Disposition: A | Discharge status: Home / Self Care | Source: Ambulatory Visit | Attending: Vascular Surgery | Admitting: Vascular Surgery

## 2023-12-13 ENCOUNTER — Ambulatory Visit: Attending: Vascular Surgery | Admitting: Vascular Surgery

## 2023-12-13 VITALS — BP 133/63 | HR 64 | Temp 97.8°F | Ht 62.0 in | Wt 193.0 lb

## 2023-12-13 DIAGNOSIS — I83893 Varicose veins of bilateral lower extremities with other complications: Secondary | ICD-10-CM | POA: Insufficient documentation

## 2023-12-13 DIAGNOSIS — I8390 Asymptomatic varicose veins of unspecified lower extremity: Secondary | ICD-10-CM | POA: Diagnosis not present

## 2023-12-13 NOTE — Progress Notes (Signed)
 Patient ID: April Mathis, female   DOB: 07-28-1950, 73 y.o.   MRN: [id number]  Reason for Consult: New Patient (Initial Visit)   Referred by Harden Jerona GAILS, MD  Subjective:     HPI:  April Mathis is a 72 y.o. female has a history of left small saphenous vein ablation for symptomatic varicosities.  She was last evaluated here 11 years ago at that time had symptom resolution.  She was placed on aspirin therapy at that time for bulging of the popliteal vein and has remained on aspirin daily.  She does have history of hypertension, borderline diabetes and is an everyday smoker.  She was recently evaluated for left knee pain and is here today for further evaluation of left lower extremity varicosities.  She states that her varicosities are not giving her pain and that she does not have swelling and thinks overall she had satisfactory result from previous ablation procedure.  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   Past Medical History:  Diagnosis Date   Bilateral leg edema    DVT of lower extremity (deep venous thrombosis) (HCC)    History of recurrent UTIs    Hypertension    Knee pain    Left knee DJD 2002   MRI done in 2002   Obesity    Smoking    Varicose vein    Family History  Problem Relation Age of Onset   Cancer Mother        LUNG   Anesthesia problems Neg Hx    Past Surgical History:  Procedure Laterality Date   KNEE ARTHROSCOPY     TUBAL LIGATION      Short Social History:  Social History   Tobacco Use   Smoking status: Every Day    Current packs/day: 1.00    Average packs/day: 1 pack/day for 20.0 years (20.0 ttl pk-yrs)    Types: Cigarettes   Smokeless tobacco: Never   Tobacco comments:    1/2 PPD  Substance Use Topics   Alcohol use: Never    Comment: every now and then, not on a regular basis     Allergies  Allergen Reactions   Hctz [Hydrochlorothiazide ] Other (See  Comments)    Reaction:  Drops pts BP   Penicillins Hives and Other (See Comments)    Has patient had a PCN reaction causing immediate rash, facial/tongue/throat swelling, SOB or lightheadedness with hypotension: No Has patient had a PCN reaction causing severe rash involving mucus membranes or skin necrosis: No Has patient had a PCN reaction that required hospitalization No Has patient had a PCN reaction occurring within the last 10 years: No If all of the above answers are NO, then may proceed with Cephalosporin use.   Ceftin  [Cefuroxime ] Diarrhea    Current Outpatient Medications  Medication Sig Dispense Refill   amLODipine (NORVASC) 10 MG tablet Take 10 mg by mouth daily.     amLODipine (NORVASC) 5 MG tablet Take 5 mg by mouth daily. (Patient not taking: Reported on 06/21/2023)     aspirin EC 81 MG tablet Take 81 mg by mouth daily.      aspirin EC 81 MG tablet Take 81 mg by mouth daily. Swallow whole. Started on own.     buPROPion (ZYBAN) 150 MG 12 hr tablet TAKE 1 TABLET BY MOUTH EVERY MORNING FOR 3 DAYS THEN TWICE A DAY (Patient not taking: Reported on  06/21/2023)     cefUROXime  (CEFTIN ) 500 MG tablet Take 1 tablet (500 mg total) by mouth 2 (two) times daily with a meal. (Patient not taking: Reported on 06/21/2023) 14 tablet 0   ciprofloxacin  (CIPRO ) 250 MG tablet Take 250 mg by mouth 2 (two) times daily. (Patient not taking: Reported on 06/21/2023)     clotrimazole  (LOTRIMIN ) 1 % cream Apply 1 application topically 2 (two) times daily. (Patient not taking: Reported on 06/21/2023) 60 g 2   clotrimazole  (LOTRIMIN ) 1 % cream Apply 1 application topically 2 (two) times daily. (Patient not taking: Reported on 06/21/2023) 113 g 1   cyclobenzaprine (FLEXERIL) 10 MG tablet Take 10 mg by mouth 3 (three) times daily as needed. (Patient not taking: Reported on 06/21/2023)     cyclobenzaprine (FLEXERIL) 5 MG tablet Take 5 mg by mouth 3 (three) times daily as needed for pain. (Patient not taking: Reported on  03/16/2022)     doxycycline  (VIBRAMYCIN ) 100 MG capsule Take 1 capsule (100 mg total) by mouth 2 (two) times daily. Avoid dairy products while taking Doxycycline . (Patient not taking: Reported on 06/21/2023) 14 capsule 0   fluconazole  (DIFLUCAN ) 150 MG tablet Take 1 tablet (150 mg total) by mouth once for 1 dose. Can take additional dose three days later if symptoms persist 1 tablet 3   lisinopril  (PRINIVIL ,ZESTRIL ) 20 MG tablet Take 20 mg by mouth daily.     metroNIDAZOLE  (FLAGYL ) 500 MG tablet Take 1 tablet (500 mg total) by mouth 2 (two) times daily. 14 tablet 2   metroNIDAZOLE  (METROGEL  VAGINAL) 0.75 % vaginal gel Place 1 Applicatorful vaginally 2 (two) times daily. Use for 5 days 70 g 1   nitrofurantoin , macrocrystal-monohydrate, (MACROBID ) 100 MG capsule Take 1 capsule (100 mg total) by mouth 2 (two) times daily. (Patient not taking: Reported on 06/21/2023) 14 capsule 0   nitrofurantoin , macrocrystal-monohydrate, (MACROBID ) 100 MG capsule Take 1 capsule (100 mg total) by mouth 2 (two) times daily. (Patient not taking: Reported on 06/21/2023) 14 capsule 0   oxyCODONE  (ROXICODONE ) 15 MG immediate release tablet Take 15 mg by mouth every 6 (six) hours as needed. (Patient not taking: Reported on 06/21/2023)     oxyCODONE -acetaminophen  (PERCOCET/ROXICET) 5-325 MG tablet Take 1 tablet by mouth every 4 (four) hours as needed for severe pain. (Patient not taking: Reported on 06/21/2023) 30 tablet 0   selenium  sulfide (SELSUN ) 2.5 % lotion Apply 1 Application topically daily as needed for irritation. (Patient not taking: Reported on 06/21/2023) 118 mL 6   terconazole  (TERAZOL 7 ) 0.4 % vaginal cream Place 1 applicator vaginally at bedtime. (Patient not taking: Reported on 06/21/2023) 45 g 0   No current facility-administered medications for this visit.    Review of Systems  Constitutional:  Constitutional negative. HENT: HENT negative.  Eyes: Eyes negative.  Respiratory: Respiratory negative.  Cardiovascular:  Cardiovascular negative.  GI: Gastrointestinal negative.  Musculoskeletal:       Right shoulder pain Skin: Skin negative.  Neurological: Neurological negative. Hematologic: Hematologic/lymphatic negative.  Psychiatric: Psychiatric negative.        Objective:  Objective   Vitals:   12/13/23 1039  BP: 133/63  Pulse: 64  Temp: 97.8 F (36.6 C)  SpO2: 92%  Weight: 193 lb (87.5 kg)  Height: 5' 2 (1.575 m)   Body mass index is 35.3 kg/m.  Physical Exam HENT:     Head: Normocephalic.  Eyes:     Pupils: Pupils are equal, round, and reactive to light.  Cardiovascular:  Pulses: Normal pulses.  Abdominal:     General: Abdomen is flat.     Tenderness: There is no guarding.  Musculoskeletal:     Cervical back: Normal range of motion.     Right lower leg: No edema.     Left lower leg: No edema.     Comments: Right shoulder lipoma  Skin:    General: Skin is warm.     Capillary Refill: Capillary refill takes less than 2 seconds.     Comments: Reticular veins bilateral medial thighs and legs and left greater than right medial ankle  Neurological:     Mental Status: She is alert.     Data: LEFT          Reflux  Reflux  Reflux  Diameter cmsComments                                No       Yes     Time                                         +--------------+--------+------+----------+------------+-------------------  ----+  CFV          no                                                            +--------------+--------+------+----------+------------+-------------------  ----+  FV mid        no                                                            +--------------+--------+------+----------+------------+-------------------  ----+  Popliteal    no                                                            +--------------+--------+------+----------+------------+-------------------  ----+  GSV at [facility]    no                           0.87                              +--------------+--------+------+----------+------------+-------------------  ----+  GSV prox thighno                          0.31                              +--------------+--------+------+----------+------------+-------------------  ----+  GSV mid thigh          yes   >500 ms      0.31                              +--------------+--------+------+----------+------------+-------------------  ----+  GSV dist thigh                                    out of fascia             +--------------+--------+------+----------+------------+-------------------  ----+  GSV at knee   no                          0.17                              +--------------+--------+------+----------+------------+-------------------  ----+  GSV prox calf          yes   >500 ms      0.25                              +--------------+--------+------+----------+------------+-------------------  ----+  SSV at [facility]                                        prior                                                                       ablation/stripping        +--------------+--------+------+----------+------------+-------------------  ----+  SSV prox calf                                     prior                                                                       ablation/stripping        +--------------+--------+------+----------+------------+-------------------  ----+  SSV mid calf           yes   >500 ms      0.27                              +--------------+--------+------+----------+------------+-------------------  ----+         Summary:  Left:  - No evidence of deep vein thrombosis seen in the left lower extremity,  from the common femoral through the popliteal veins.  - No evidence of superficial venous thrombosis in the left lower  extremity.  - Venous reflux is noted in the left  greater saphenous vein in the thigh.  - Venous reflux is noted in the left greater saphenous vein in the calf.  - Venous reflux is noted in the left short saphenous vein.  - Venous reflux is noted in the left perforator vein.         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  Patient does not have interest in having these treated given that they remain asymptomatic.  She questions whether or not she needs aspirin therapy to continue which was prescribed 11 years ago given her cardiac risk factors it seems prudent that she would continue aspirin but from a strict lower extremity standpoint she does not necessarily require aspirin therapy at this time.  I have recommended gentle compression stockings as needed and follow-up on an as-needed basis.     Penne Lonni Colorado MD [facility]

## 2023-12-29 ENCOUNTER — Ambulatory Visit: Admitting: Obstetrics

## 2024-04-03 ENCOUNTER — Ambulatory Visit: Admitting: Internal Medicine
